# Patient Record
Sex: Female | Born: 1980 | Race: White | Hispanic: No | Marital: Single | Smoking: Current every day smoker
Health system: Southern US, Community
[De-identification: ages and names within clinical notes are randomized; demographics above are authoritative.]

## PROBLEM LIST (undated history)

## (undated) ENCOUNTER — Emergency Department (HOSPITAL_COMMUNITY): Payer: Medicaid Other

## (undated) DIAGNOSIS — F319 Bipolar disorder, unspecified: Secondary | ICD-10-CM

## (undated) DIAGNOSIS — Z765 Malingerer [conscious simulation]: Secondary | ICD-10-CM

## (undated) DIAGNOSIS — G8929 Other chronic pain: Secondary | ICD-10-CM

## (undated) DIAGNOSIS — C801 Malignant (primary) neoplasm, unspecified: Secondary | ICD-10-CM

## (undated) DIAGNOSIS — M5126 Other intervertebral disc displacement, lumbar region: Secondary | ICD-10-CM

## (undated) DIAGNOSIS — M329 Systemic lupus erythematosus, unspecified: Secondary | ICD-10-CM

## (undated) DIAGNOSIS — IMO0002 Reserved for concepts with insufficient information to code with codable children: Secondary | ICD-10-CM

## (undated) DIAGNOSIS — R109 Unspecified abdominal pain: Secondary | ICD-10-CM

## (undated) DIAGNOSIS — F419 Anxiety disorder, unspecified: Secondary | ICD-10-CM

## (undated) HISTORY — PX: TONSILLECTOMY: SUR1361

## (undated) HISTORY — PX: CHOLECYSTECTOMY: SHX55

## (undated) HISTORY — PX: ABDOMINAL HYSTERECTOMY: SHX81

---

## 2005-03-23 ENCOUNTER — Inpatient Hospital Stay (HOSPITAL_COMMUNITY): Admission: AD | Admit: 2005-03-23 | Discharge: 2005-03-23 | Payer: Self-pay | Admitting: *Deleted

## 2005-08-08 ENCOUNTER — Emergency Department (HOSPITAL_COMMUNITY): Admission: EM | Admit: 2005-08-08 | Discharge: 2005-08-08 | Payer: Self-pay | Admitting: Emergency Medicine

## 2005-08-14 ENCOUNTER — Inpatient Hospital Stay (HOSPITAL_COMMUNITY): Admission: AD | Admit: 2005-08-14 | Discharge: 2005-08-14 | Payer: Self-pay | Admitting: Obstetrics and Gynecology

## 2005-10-04 ENCOUNTER — Inpatient Hospital Stay (HOSPITAL_COMMUNITY): Admission: AD | Admit: 2005-10-04 | Discharge: 2005-10-04 | Payer: Self-pay | Admitting: Gynecology

## 2005-11-26 ENCOUNTER — Ambulatory Visit (HOSPITAL_COMMUNITY): Admission: RE | Admit: 2005-11-26 | Discharge: 2005-11-26 | Payer: Self-pay | Admitting: Obstetrics

## 2005-11-30 ENCOUNTER — Emergency Department (HOSPITAL_COMMUNITY): Admission: EM | Admit: 2005-11-30 | Discharge: 2005-11-30 | Payer: Self-pay | Admitting: Family Medicine

## 2006-01-12 ENCOUNTER — Inpatient Hospital Stay (HOSPITAL_COMMUNITY): Admission: AD | Admit: 2006-01-12 | Discharge: 2006-01-12 | Payer: Self-pay | Admitting: Obstetrics

## 2006-01-19 ENCOUNTER — Inpatient Hospital Stay (HOSPITAL_COMMUNITY): Admission: AD | Admit: 2006-01-19 | Discharge: 2006-01-20 | Payer: Self-pay | Admitting: Obstetrics

## 2006-01-20 ENCOUNTER — Inpatient Hospital Stay (HOSPITAL_COMMUNITY): Admission: AD | Admit: 2006-01-20 | Discharge: 2006-01-22 | Payer: Self-pay | Admitting: Obstetrics & Gynecology

## 2006-11-27 ENCOUNTER — Emergency Department (HOSPITAL_COMMUNITY): Admission: EM | Admit: 2006-11-27 | Discharge: 2006-11-27 | Payer: Self-pay | Admitting: Family Medicine

## 2007-01-08 ENCOUNTER — Emergency Department (HOSPITAL_COMMUNITY): Admission: EM | Admit: 2007-01-08 | Discharge: 2007-01-08 | Payer: Self-pay | Admitting: Family Medicine

## 2007-01-20 ENCOUNTER — Emergency Department (HOSPITAL_COMMUNITY): Admission: EM | Admit: 2007-01-20 | Discharge: 2007-01-20 | Payer: Self-pay | Admitting: Emergency Medicine

## 2007-02-04 ENCOUNTER — Emergency Department (HOSPITAL_COMMUNITY): Admission: EM | Admit: 2007-02-04 | Discharge: 2007-02-04 | Payer: Self-pay | Admitting: Emergency Medicine

## 2007-02-10 ENCOUNTER — Emergency Department (HOSPITAL_COMMUNITY): Admission: EM | Admit: 2007-02-10 | Discharge: 2007-02-10 | Payer: Self-pay | Admitting: Family Medicine

## 2010-10-23 NOTE — H&P (Signed)
Sonya King, Sonya King             ACCOUNT NO.:  [id number]   MEDICAL RECORD NO.:  [id number]          PATIENT TYPE:  INP   LOCATION:  9166                          FACILITY:  [facility]   PHYSICIAN:  Roseanna Rainbow, M.D.DATE OF BIRTH:  07-16-1980   DATE OF ADMISSION:  01/20/2006  DATE OF DISCHARGE:                                HISTORY & PHYSICAL   CHIEF COMPLAINT:  The patient is a 30 year old para 1 with an estimated date  of confinement of August 15 with an intrauterine pregnancy at 40 weeks  complaining of uterine contractions.   HISTORY OF PRESENT ILLNESS:  Please see the above.   ALLERGIES:  No known drug allergies.   MEDICATIONS:  Prenatal vitamins.   RISK FACTORS:  Late onset of care, current tobacco use.   PRENATAL LABS:  Blood type O positive, antibody screen negative, RPR  nonreactive, Rubella immune, hepatitis B surface antigen negative, GBS  negative, HIV nonreactive, Chlamydia negative, GC negative. Urine culture  and sensitivity no growth.  Hematocrit 36.5, hemoglobin 12.2, platelets  207,000.  Pap smear: ASCUS with negative high risk HPV.   PAST GYN HISTORY:  Cervical dysplasia status post cryocautery.   PAST MEDICAL HISTORY:  No significant history of medical diseases.   PAST SURGICAL HISTORY:  No previous surgeries.   SOCIAL HISTORY:  Single, does not give any significant history of alcohol  use, she currently smokes less than 1/2 pack per day, has smoked for 5-10  years.  Denies illicit drug use.   FAMILY HISTORY:  Hypertension.   PAST OB HISTORY:  She has a history of one spontaneous vaginal delivery.   PHYSICAL EXAMINATION:  Blood pressure 135/86, vital signs stable, afebrile.  Fetal heart rate 170s to 180s, occasional variable, mild to moderate  decelerations.  Uterine contractions every 2 minutes.  Sterile vaginal exam  fully dilated, bulging bag of water, vertex at a +1 station, questionable  direct OP status post artificial rupture of membranes  for clear fluid.   ASSESSMENT:  Term, active labor, mild fetal sinus tachycardia.   PLAN:  Admission, delivery imminent.      Roseanna Rainbow, M.D.  Electronically Signed     LAJ/MEDQ  D:  01/20/2006  T:  01/20/2006  Job:  [id number]

## 2010-10-23 NOTE — H&P (Signed)
Sonya King, Sonya King             ACCOUNT NO.:  [id number]   MEDICAL RECORD NO.:  [id number]          PATIENT TYPE:  INP   LOCATION:  9166                          [facility]ILITY:  [facility]   PHYSICIAN:  Roseanna Rainbow, M.D.DATE OF BIRTH:  [birth year]   DATE OF ADMISSION:  01/20/2006  DATE OF DISCHARGE:                                HISTORY & PHYSICAL   CHIEF COMPLAINT:  The patient is a 31 year old para 1 with an estimated date  of confinement of August 16 complaining of uterine contractions.   HISTORY OF PRESENT ILLNESS:  Please see the above.   ALLERGIES:  No known drug allergies.   MEDICATIONS:  Prenatal vitamins.   OB RISK FACTORS:  Late onset of prenatal care.  Current tobacco use.   PRENATAL LABS:  Blood type O positive.  Antibody screen negative.  RPR  nonreactive.  Rubella immune.  Hepatitis B surface antigen negative.  GBS  negative.  HIV nonreactive.  Chlamydia negative.  GC negative.  Urine  culture and sensitivity no growth.  Hematocrit 36.5, hemoglobin 12.2,  platelets 207,000.  Pap smear ascus, high risk HPV detected.   PAST GYN HISTORY:  She has a history of cervical dysplasia.  She is status  post cryocautery of the cervix.   PAST MEDICAL HISTORY:  No significant history of medical diseases.   PAST SURGICAL HISTORY:  Please see the above.   SOCIAL HISTORY:  Single.  She does not give any significant history of  alcohol use.  She currently smokes less than 1/2 pack per day, smoked for 5-  10 years.  She denies illicit drug use.   FAMILY HISTORY:  Hypertension.   Dictation ends at this point.      Roseanna Rainbow, M.D.     Sonya King  D:  01/20/2006  T:  01/20/2006  Job:  [id number]

## 2011-03-19 LAB — DIFFERENTIAL
Basophils Absolute: 0
Basophils Relative: 0
Eosinophils Absolute: 0.2
Eosinophils Relative: 3
Lymphocytes Relative: 27
Lymphs Abs: 1.5
Monocytes Absolute: 0.4
Monocytes Relative: 7
Neutro Abs: 3.4
Neutrophils Relative %: 63

## 2011-03-19 LAB — I-STAT 8, (EC8 V) (CONVERTED LAB)
Acid-base deficit: 5 — ABNORMAL HIGH
BUN: 9
Bicarbonate: 18.7 — ABNORMAL LOW
Chloride: 110
Glucose, Bld: 92
HCT: 42
Hemoglobin: 14.3
Operator id: 288831
Potassium: 4
Sodium: 137
TCO2: 20
pCO2, Ven: 31.1 — ABNORMAL LOW
pH, Ven: 7.387 — ABNORMAL HIGH

## 2011-03-19 LAB — URINALYSIS, ROUTINE W REFLEX MICROSCOPIC
Bilirubin Urine: NEGATIVE
Glucose, UA: NEGATIVE
Hgb urine dipstick: NEGATIVE
Ketones, ur: NEGATIVE
Nitrite: NEGATIVE
Protein, ur: NEGATIVE
Specific Gravity, Urine: 1.018
Urobilinogen, UA: 0.2
pH: 6.5

## 2011-03-19 LAB — URINE MICROSCOPIC-ADD ON

## 2011-03-19 LAB — GC/CHLAMYDIA PROBE AMP, GENITAL
Chlamydia, DNA Probe: NEGATIVE
GC Probe Amp, Genital: NEGATIVE

## 2011-03-19 LAB — WET PREP, GENITAL
Clue Cells Wet Prep HPF POC: NONE SEEN
Trich, Wet Prep: NONE SEEN
WBC, Wet Prep HPF POC: NONE SEEN
Yeast Wet Prep HPF POC: NONE SEEN

## 2011-03-19 LAB — RPR
RPR Ser Ql: NONREACTIVE
RPR Ser Ql: NONREACTIVE

## 2011-03-19 LAB — HEPATIC FUNCTION PANEL
ALT: 10
AST: 22
Albumin: 3.6
Alkaline Phosphatase: 61
Bilirubin, Direct: 0.1
Indirect Bilirubin: 0.5
Total Bilirubin: 0.6
Total Protein: 6.3

## 2011-03-19 LAB — POCT PREGNANCY, URINE
Operator id: 288831
Preg Test, Ur: NEGATIVE

## 2011-03-19 LAB — CBC
HCT: 38.2
Hemoglobin: 13
MCHC: 34.1
MCV: 87.3
Platelets: 263
RBC: 4.37
RDW: 15.2 — ABNORMAL HIGH
WBC: 5.4

## 2011-03-19 LAB — POCT I-STAT CREATININE
Creatinine, Ser: 0.5
Operator id: 288831

## 2011-03-19 LAB — LIPASE, BLOOD: Lipase: 15

## 2011-03-24 LAB — POCT RAPID STREP A: Streptococcus, Group A Screen (Direct): POSITIVE — AB

## 2015-10-17 ENCOUNTER — Encounter (HOSPITAL_COMMUNITY): Payer: Self-pay

## 2015-10-17 ENCOUNTER — Emergency Department (HOSPITAL_COMMUNITY)
Admission: EM | Admit: 2015-10-17 | Discharge: 2015-10-17 | Disposition: A | Discharge status: Home / Self Care | Payer: Self-pay | Attending: Emergency Medicine | Admitting: Emergency Medicine

## 2015-10-17 DIAGNOSIS — M546 Pain in thoracic spine: Secondary | ICD-10-CM | POA: Insufficient documentation

## 2015-10-17 DIAGNOSIS — Z9889 Other specified postprocedural states: Secondary | ICD-10-CM | POA: Insufficient documentation

## 2015-10-17 DIAGNOSIS — F17200 Nicotine dependence, unspecified, uncomplicated: Secondary | ICD-10-CM | POA: Insufficient documentation

## 2015-10-17 DIAGNOSIS — G8929 Other chronic pain: Secondary | ICD-10-CM | POA: Insufficient documentation

## 2015-10-17 DIAGNOSIS — F419 Anxiety disorder, unspecified: Secondary | ICD-10-CM | POA: Insufficient documentation

## 2015-10-17 DIAGNOSIS — M545 Low back pain: Secondary | ICD-10-CM | POA: Insufficient documentation

## 2015-10-17 DIAGNOSIS — M549 Dorsalgia, unspecified: Secondary | ICD-10-CM

## 2015-10-17 HISTORY — DX: Other intervertebral disc displacement, lumbar region: M51.26

## 2015-10-17 HISTORY — DX: Anxiety disorder, unspecified: F41.9

## 2015-10-17 HISTORY — DX: Bipolar disorder, unspecified: F31.9

## 2015-10-17 MED ORDER — ALPRAZOLAM 1 MG PO TABS
2.0000 mg | ORAL_TABLET | Freq: Once | ORAL | Status: AC
  Administered 2015-10-17: 2 mg via ORAL
  Filled 2015-10-17: qty 2

## 2015-10-17 MED ORDER — OXYCODONE-ACETAMINOPHEN 5-325 MG PO TABS
1.0000 | ORAL_TABLET | Freq: Once | ORAL | Status: AC
  Administered 2015-10-17: 1 via ORAL
  Filled 2015-10-17: qty 1

## 2015-10-17 NOTE — ED Notes (Signed)
Pt has slipped disc and is on pain meds.  Also takes meds for anxiety.  Pt here for emergent funeral of mother.  Missed her appt and cannot get refill of meds.  Here for another 2 weeks with family.  Pt requesting percocet 7.5, xanax 2mg 

## 2015-10-17 NOTE — ED Notes (Signed)
Patient requesting prescriptions for Percocet and Xanax.  Patient states she is from [place] here for her mother's funeral, and left medications at home.  Patient states she has "plenty" of her other medications besides Percocet and Xanax.  Patient states she spoke with her pain management doctor in [place] that suggested she come to the ER for medications in order to prevent seizures.  Patient takes medications for chronic back pain and anxiety.

## 2015-10-17 NOTE — ED Provider Notes (Signed)
CSN: [id number]     Arrival date & time 10/17/15  1730 History  By signing my name below, I, Soijett Blue, attest that this documentation has been prepared under the direction and in the presence of Hyman Bible, PA-C Electronically Signed: [place], ED Scribe. 10/17/2015. 7:17 PM.   Chief Complaint  Patient presents with  . Back Pain  . Anxiety      The history is provided by the patient. No language interpreter was used.    HPI Comments: Sonya King is a 35 y.o. female with a medical hx of "ruptured lumbar disc"who presents to the Emergency Department complaining of chronic back pain worsening one week ago. Pt reports that she has a slipped disc and is on chronic pain medications. Pt states that she is from [place] and is in town for an emergent funeral for her mother. Pt states that she missed her pain management appointment due to being in [place] for her mother funeral and was unable to get a refill of her medications. Pt states that she is on xanax and percocet daily with her last dose being 4/31/17. Pt notes that 8 years ago she was 8 months pregnant and she fell down a flight of steps and has been in physical therapy and in pain management since.  No new injury or trauma.  Pt is now requesting Rx refill of 7.5 percocet and xanax.   She states that she is having associated symptoms of diarrhea and feeling shaky. She states that she has not tried any medications for the relief for her symptoms. Pt denies gait problem, fever, chills, numbness, tingling, bowel/bladder incontinence, or any other symptoms. Pt states that she is not driving, but her sister is driving.    Past Medical History  Diagnosis Date  . Ruptured lumbar disc   . Anxiety   . Bipolar 1 disorder [facility])    Past Surgical History  Procedure Laterality Date  . Abdominal hysterectomy    . Cholecystectomy    . Tonsillectomy     History reviewed. No pertinent family history. Social History  Substance Use Topics   . Smoking status: Current Every Day Smoker  . Smokeless tobacco: None  . Alcohol Use: No   OB History    No data available     Review of Systems  A complete 10 system review of systems was obtained and all systems are negative except as noted in the HPI and PMH.   Allergies  Review of patient's allergies indicates no known allergies.  Home Medications   Prior to Admission medications   Not on File   BP 123/85 mmHg  Pulse 100  Temp(Src) 97.8 F (36.6 C) (Oral)  Resp 18  SpO2 100% Physical Exam  Constitutional: She is oriented to person, place, and time. She appears well-developed and well-nourished. No distress.  HENT:  Head: Normocephalic and atraumatic.  Eyes: EOM are normal.  Neck: Neck supple.  Cardiovascular: Normal rate, regular rhythm and normal heart sounds.  Exam reveals no gallop and no friction rub.   No murmur heard. Pulmonary/Chest: Effort normal and breath sounds normal. No respiratory distress. She has no wheezes. She has no rales.  Abdominal: She exhibits no distension.  Musculoskeletal: Normal range of motion.  Diffuse TTP of thoarcic and lumbar spine. 5/5 strength of lower extremities bilaterally.   Neurological: She is alert and oriented to person, place, and time.  2+ patellar reflexes bilaterally  Skin: Skin is warm and dry.  Psychiatric: She has  a normal mood and affect. Her behavior is normal.  Nursing note and vitals reviewed.   ED Course  Procedures (including critical care time) DIAGNOSTIC STUDIES: Oxygen Saturation is 100% on RA, nl by my interpretation.    COORDINATION OF CARE: 7:11 PM Discussed treatment plan with pt at bedside which includes percocet and xanax and pt agreed to plan.    Labs Review Labs Reviewed - No data to display  Imaging Review No results found.    EKG Interpretation None      MDM   Final diagnoses:  None    Patient with back pain.  No neurological deficits and normal neuro exam.  Patient is  ambulatory.  No loss of bowel or bladder control.  No concern for cauda equina.  No fever, night sweats, weight loss, h/o cancer, IVDA, no recent procedure to back. No urinary symptoms suggestive of UTI. Pt given a dose of xanax and percocet while in the ED.  Patient informed that she would not be given prescriptions for Percocet and Xanax for chronic pain and anxiety.  Supportive care and return precaution discussed. Appears safe for discharge at this time. Follow up as indicated in discharge paperwork.   According to the [place] Controlled Substance Reporting System, pt received twelve 7.5 mg percocet and xanax on 10/09/15 that was Rx by [facility].  However, patient reported to me that her last dose was 4/31/17.  I personally performed the services described in this documentation, which was scribed in my presence. The recorded information has been reviewed and is accurate.     Hyman Bible, PA-C 10/18/15 [place], MD 10/20/15 [phone]

## 2015-10-18 ENCOUNTER — Ambulatory Visit (HOSPITAL_COMMUNITY)
Admission: EM | Admit: 2015-10-18 | Discharge: 2015-10-18 | Disposition: A | Discharge status: Home / Self Care | Payer: PRIVATE HEALTH INSURANCE | Attending: Emergency Medicine | Admitting: Emergency Medicine

## 2015-10-18 ENCOUNTER — Encounter (HOSPITAL_COMMUNITY): Payer: Self-pay | Admitting: Emergency Medicine

## 2015-10-18 DIAGNOSIS — Z760 Encounter for issue of repeat prescription: Secondary | ICD-10-CM

## 2015-10-18 DIAGNOSIS — F419 Anxiety disorder, unspecified: Secondary | ICD-10-CM

## 2015-10-18 MED ORDER — ALPRAZOLAM 2 MG PO TABS: 2.0000 mg | ORAL_TABLET | Freq: Two times a day (BID) | ORAL | Status: DC | PRN

## 2015-10-18 NOTE — ED Provider Notes (Signed)
CSN: [id number]     Arrival date & time 10/18/15  19 History   First MD Initiated Contact with Patient 10/18/15 1826     Chief Complaint  Patient presents with  . Medication Refill   (Consider location/radiation/quality/duration/timing/severity/associated sxs/prior Treatment) HPI History obtained from patient:  Pt presents with the cc of: Medication refill for anxiety and chronic pain Duration of symptoms: Out of medication for several days now. Treatment prior to arrival: None Context: Patient states that she is in [place] for her mother's funeral. She states that she lives in [place] and was unable to get to her pain management appointments prior to leaving to get refills on her medication. States that she was seen at the [facility] emergency department last night but was directed to urgent care to get refills of her medication. Patient is requesting Xanax and Percocet at this time. Xanax for chronic anxiety Percocet for lumbar disc pain. Other symptoms include: None Pain score: 6 FAMILY HISTORY: SOCIAL HISTORY: Smoker  Past Medical History  Diagnosis Date  . Ruptured lumbar disc   . Anxiety   . Bipolar 1 disorder [facility])    Past Surgical History  Procedure Laterality Date  . Abdominal hysterectomy    . Cholecystectomy    . Tonsillectomy     No family history on file. Social History  Substance Use Topics  . Smoking status: Current Every Day Smoker  . Smokeless tobacco: None  . Alcohol Use: No   OB History    No data available     Review of Systems ROS +'ve medication refill for anxiety and chronic pain  Denies: HEADACHE, NAUSEA, ABDOMINAL PAIN, CHEST PAIN, CONGESTION, DYSURIA, SHORTNESS OF BREATH  Allergies  Review of patient's allergies indicates no known allergies.  Home Medications   Prior to Admission medications   Medication Sig Start Date End Date Taking? Authorizing Provider  ALPRAZolam Duanne Moron) 2 MG tablet Take 1 tablet (2 mg total) by mouth 2 (two)  times daily as needed for anxiety. 10/18/15   Konrad Felix, PA   Meds Ordered and Administered this Visit  Medications - No data to display  BP 79/37 mmHg  Pulse 86  Temp(Src) 98.1 F (36.7 C) (Oral)  Resp 18  SpO2 98% No data found.   Physical Exam NURSES NOTES AND VITAL SIGNS REVIEWED. CONSTITUTIONAL: Well developed, well nourished, no acute distress HEENT: normocephalic, atraumatic EYES: Conjunctiva normal NECK:normal ROM, supple, no adenopathy PULMONARY:No respiratory distress, normal effort ABDOMINAL: Soft, ND, NT BS+, No CVAT MUSCULOSKELETAL: Normal ROM of all extremities,  SKIN: warm and dry without rash PSYCHIATRIC: Mood and affect, behavior are normal  ED Course  Procedures (including critical care time)  Labs Review Labs Reviewed - No data to display  Imaging Review No results found.   Visual Acuity Review  Right Eye Distance:   Left Eye Distance:   Bilateral Distance:    Right Eye Near:   Left Eye Near:    Bilateral Near:     Discussion with patient that in [place] control substance reporting database reveals that she received Percocet and Xanax at [facility] 10/09/2015. I have advised patient that I will not provide her with a prescription for Percocet but due to the fact that sudden withdrawal of benzodiazepines can be life-threatening and will continue the Xanax prescription. I have also advised patient that she should contact her doctor in [place] as the urgent care is not the place for medication prescriptions especially for chronic pain and chronic anxiety.  Prescription for Xanax 2 mg #30 provided to the patient.  MDM   1. Encounter for medication refill     Patient is reassured that there are no issues that require transfer to higher level of care at this time or additional tests. Patient is advised to continue home symptomatic treatment. Patient is advised that if there are new or worsening symptoms to attend the emergency  department, contact primary care provider, or return to UC. Instructions of care provided discharged home in stable condition.    THIS NOTE WAS GENERATED USING A VOICE RECOGNITION SOFTWARE PROGRAM. ALL REASONABLE EFFORTS  WERE MADE TO PROOFREAD THIS DOCUMENT FOR ACCURACY.  I have verbally reviewed the discharge instructions with the patient. A printed AVS was given to the patient.  All questions were answered prior to discharge.      Konrad Felix, Marvin 10/18/15 Doran Heater

## 2015-10-18 NOTE — ED Notes (Addendum)
Here to have refills on meds; Oxycodone and Xanax ... Seen at [facility] ED last night and was told to f/u here Reports she's from [place] and will be here for the next 2 weeks BP today is 79/37 A&O x4... No acute distress.

## 2015-10-18 NOTE — ED Notes (Signed)
D/c by frank patrick, pa 

## 2015-10-18 NOTE — Discharge Instructions (Signed)
Medicine Refill at the Emergency Department  We have refilled your medicine today, but it is best for you to get refills through your primary health care provider's office. In the future, please plan ahead so you do not need to get refills from the emergency department.  If the medicine we refilled was a maintenance medicine, you may have received only enough to get you by until you are able to see your regular health care provider.     This information is not intended to replace advice given to you by your health care provider. Make sure you discuss any questions you have with your health care provider.     Document Released: 09/10/2003 Document Revised: 06/14/2014 Document Reviewed: 08/31/2013  Elsevier Interactive Patient Education 2016 [employer]

## 2015-12-06 ENCOUNTER — Ambulatory Visit (HOSPITAL_COMMUNITY)
Admission: AD | Admit: 2015-12-06 | Discharge: 2015-12-06 | Disposition: A | Discharge status: Home / Self Care | Payer: PRIVATE HEALTH INSURANCE | Attending: Psychiatry | Admitting: Psychiatry

## 2015-12-06 NOTE — BH Assessment (Signed)
Consulted with Darlyne Russian, PA-C who states that patient does not met inpatient criteria at this time. He recommends that patient receives outpatient resource for psychiatry and possible addictions treatment.   Rosalin Hawking, LCSW Therapeutic Triage Specialist [place] 12/06/2015 8:53 PM
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
## 2015-12-06 NOTE — BH Assessment (Signed)
Discussed outpatient resources with patient. Patient asked if she could go to any facilities now for her prescription and was encouraged to call. Patient denies questions or concerns at this time.  Patient was discharged. Without concerns and contracts for safety.   Rosalin Hawking, LCSW Therapeutic Triage Specialist [place] 12/06/2015 9:06 PM

## 2016-01-07 ENCOUNTER — Ambulatory Visit (HOSPITAL_COMMUNITY)
Admission: EM | Admit: 2016-01-07 | Discharge: 2016-01-07 | Disposition: A | Discharge status: Home / Self Care | Payer: PRIVATE HEALTH INSURANCE | Attending: Family Medicine | Admitting: Family Medicine

## 2016-01-07 ENCOUNTER — Encounter (HOSPITAL_COMMUNITY): Payer: Self-pay | Admitting: Family Medicine

## 2016-01-07 DIAGNOSIS — F419 Anxiety disorder, unspecified: Secondary | ICD-10-CM

## 2016-01-07 MED ORDER — ALPRAZOLAM 2 MG PO TABS
2.0000 mg | ORAL_TABLET | Freq: Three times a day (TID) | ORAL | 0 refills | Status: AC
Start: 2016-01-07 — End: 2016-01-12

## 2016-01-07 NOTE — ED Triage Notes (Signed)
Pt here for anxiety sts that she ran out of her meds 2 days ago and her anxiety is worsening.

## 2016-01-09 NOTE — ED Provider Notes (Signed)
CSN: [id number]     Arrival date & time 01/07/16  1726 History   First MD Initiated Contact with Patient 01/09/16 1900     Chief Complaint  Patient presents with  . Anxiety   (Consider location/radiation/quality/duration/timing/severity/associated sxs/prior Treatment) The history is provided by the patient.  Anxiety  This is a chronic problem. The current episode started 2 days ago (She have been out of her xanx x 2 days). The problem occurs constantly. The problem has been gradually worsening. Pertinent negatives include no chest pain, no abdominal pain, no headaches and no shortness of breath. Associated symptoms comments: Positive for nervousness, muscle tension, irritability. Exacerbated by: Recent family-related stress and out of med x 2 days. Nothing relieves the symptoms. She has tried nothing for the symptoms.    Past Medical History:  Diagnosis Date  . Anxiety   . Bipolar 1 disorder ([place])   . Ruptured lumbar disc    Past Surgical History:  Procedure Laterality Date  . ABDOMINAL HYSTERECTOMY    . CHOLECYSTECTOMY    . TONSILLECTOMY     History reviewed. No pertinent family history. Social History  Substance Use Topics  . Smoking status: Current Every Day Smoker  . Smokeless tobacco: Never Used  . Alcohol use No   OB History    No data available     Review of Systems  Constitutional: Negative for diaphoresis and fatigue.  Respiratory: Negative for shortness of breath and wheezing.   Cardiovascular: Negative for chest pain and palpitations.  Gastrointestinal: Negative for abdominal pain.  Neurological: Negative for headaches.  Psychiatric/Behavioral: Positive for agitation and decreased concentration. Negative for behavioral problems, self-injury and suicidal ideas. The patient is nervous/anxious. The patient is not hyperactive.     Allergies  Review of patient's allergies indicates no known allergies.  Home Medications   Prior to Admission medications    Medication Sig Start Date End Date Taking? Authorizing Provider  alprazolam Duanne Moron) 2 MG tablet Take 1 tablet (2 mg total) by mouth 3 (three) times daily. 01/07/16 01/12/16  Barry Dienes, NP   Meds Ordered and Administered this Visit  Medications - No data to display  BP 140/92 (BP Location: Right Arm)   Pulse 88   Temp 98 F (36.7 C) (Oral)   Resp 20   Ht 5\' 4"  (1.626 m)   Wt 172 lb (78 kg)   SpO2 97%   BMI 29.52 kg/m  No data found.   Physical Exam  Constitutional: She is oriented to person, place, and time. She appears well-developed and well-nourished.  Cardiovascular: Normal rate, regular rhythm and normal heart sounds.   Pulmonary/Chest: Effort normal and breath sounds normal.  Abdominal: Soft. Bowel sounds are normal.  Neurological: She is alert and oriented to person, place, and time.  Psychiatric:  Patient appears anxious and nervous, fidgeting with her hands. She has good eye contact, has appropriate judgement. She is oriented to the situation    Urgent Care Course   Clinical Course    Procedures (including critical care time)  Labs Review Labs Reviewed - No data to display  Imaging Review No results found.   Visual Acuity Review  Right Eye Distance:   Left Eye Distance:   Bilateral Distance:    Right Eye Near:   Left Eye Near:    Bilateral Near:         MDM   1. Anxiety    Mrs. Mollard is a 35 year old female with history of anxiety and bipolar, presents today  requesting refill for her xanax. She ran out of her xanax 2 days ago. She is experiencing anxiety, nervousness and irritability. [place] controlled substances registry showed that patient has her last xanax refilled 3 months ago with 15 days of supplies. Patient instructed to establish care with a PCP for her anxiety. 5-day course of xanax 2mg  TID PRN refilled. Informed to go to ER for panic attack, if she develops one.     Barry Dienes, NP 01/09/16 1230

## 2016-01-10 ENCOUNTER — Emergency Department (HOSPITAL_COMMUNITY): Payer: Self-pay

## 2016-01-10 ENCOUNTER — Emergency Department (HOSPITAL_COMMUNITY)
Admission: EM | Admit: 2016-01-10 | Discharge: 2016-01-10 | Disposition: A | Discharge status: Home / Self Care | Payer: Self-pay | Attending: Emergency Medicine | Admitting: Emergency Medicine

## 2016-01-10 ENCOUNTER — Encounter (HOSPITAL_COMMUNITY): Payer: Self-pay | Admitting: Emergency Medicine

## 2016-01-10 DIAGNOSIS — F17200 Nicotine dependence, unspecified, uncomplicated: Secondary | ICD-10-CM | POA: Insufficient documentation

## 2016-01-10 DIAGNOSIS — K6289 Other specified diseases of anus and rectum: Secondary | ICD-10-CM

## 2016-01-10 DIAGNOSIS — T7421XA Adult sexual abuse, confirmed, initial encounter: Secondary | ICD-10-CM | POA: Insufficient documentation

## 2016-01-10 DIAGNOSIS — Y92810 Car as the place of occurrence of the external cause: Secondary | ICD-10-CM | POA: Insufficient documentation

## 2016-01-10 DIAGNOSIS — S63501A Unspecified sprain of right wrist, initial encounter: Secondary | ICD-10-CM | POA: Insufficient documentation

## 2016-01-10 DIAGNOSIS — Y939 Activity, unspecified: Secondary | ICD-10-CM | POA: Insufficient documentation

## 2016-01-10 DIAGNOSIS — R109 Unspecified abdominal pain: Secondary | ICD-10-CM | POA: Insufficient documentation

## 2016-01-10 DIAGNOSIS — Y999 Unspecified external cause status: Secondary | ICD-10-CM | POA: Insufficient documentation

## 2016-01-10 DIAGNOSIS — R102 Pelvic and perineal pain: Secondary | ICD-10-CM

## 2016-01-10 MED ORDER — IBUPROFEN 200 MG PO TABS
600.0000 mg | ORAL_TABLET | Freq: Once | ORAL | Status: AC
  Administered 2016-01-10: 600 mg via ORAL
  Filled 2016-01-10: qty 3

## 2016-01-10 MED ORDER — ACETAMINOPHEN 325 MG PO TABS
650.0000 mg | ORAL_TABLET | Freq: Once | ORAL | Status: DC
  Filled 2016-01-10: qty 2
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
## 2016-01-10 NOTE — ED Notes (Signed)
Wrist splint applied to right wrist.
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
## 2016-01-10 NOTE — ED Notes (Signed)
Patient requested pain meds, tylenol scanned and given per order, she then refused this. Med was wasted. Patient requested to speak to charge RN privately

## 2016-01-10 NOTE — ED Notes (Signed)
Bed: [facility] Expected date:  Expected time:  Means of arrival:  Comments: 

## 2016-01-10 NOTE — ED Notes (Signed)
Sane RN at bedside.
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
## 2016-01-10 NOTE — ED Notes (Signed)
Patient refusing to speak to SANE RN. Ready for discharge

## 2016-01-10 NOTE — ED Notes (Signed)
SANE  Nurse called .

## 2016-01-10 NOTE — ED Notes (Signed)
Pt. Said that she's been taking percocet for pain and not sure if ibuprofen will help her pain, requested for percocet instead.

## 2016-04-15 ENCOUNTER — Encounter (HOSPITAL_COMMUNITY): Payer: Self-pay

## 2016-04-15 DIAGNOSIS — Z8541 Personal history of malignant neoplasm of cervix uteri: Secondary | ICD-10-CM | POA: Diagnosis not present

## 2016-04-15 DIAGNOSIS — F17200 Nicotine dependence, unspecified, uncomplicated: Secondary | ICD-10-CM | POA: Diagnosis not present

## 2016-04-15 DIAGNOSIS — R1031 Right lower quadrant pain: Secondary | ICD-10-CM | POA: Insufficient documentation

## 2016-04-15 NOTE — ED Triage Notes (Signed)
Pt states she started having dull pains in right abdomen this morning and continued to become sharp shooting pains throughout the day; Pt states nausea but no vomiting; Pt c/o of pain 10/10 on arrival. Pt a&ox 4.

## 2016-04-16 ENCOUNTER — Emergency Department (HOSPITAL_COMMUNITY): Payer: Medicaid Other

## 2016-04-16 ENCOUNTER — Emergency Department (HOSPITAL_COMMUNITY)
Admission: EM | Admit: 2016-04-16 | Discharge: 2016-04-16 | Disposition: A | Discharge status: Home / Self Care | Payer: Medicaid Other | Attending: Emergency Medicine | Admitting: Emergency Medicine

## 2016-04-16 DIAGNOSIS — R1031 Right lower quadrant pain: Secondary | ICD-10-CM

## 2016-04-16 HISTORY — DX: Malignant (primary) neoplasm, unspecified: C80.1

## 2016-04-16 LAB — COMPREHENSIVE METABOLIC PANEL
ALBUMIN: 3.5 g/dL (ref 3.5–5.0)
ALK PHOS: 54 U/L (ref 38–126)
ALT: 6 U/L — AB (ref 14–54)
AST: 17 U/L (ref 15–41)
Anion gap: 8 (ref 5–15)
BUN: 5 mg/dL — AB (ref 6–20)
CHLORIDE: 110 mmol/L (ref 101–111)
CO2: 21 mmol/L — AB (ref 22–32)
CREATININE: 0.82 mg/dL (ref 0.44–1.00)
Calcium: 8.8 mg/dL — ABNORMAL LOW (ref 8.9–10.3)
GFR calc non Af Amer: >60 mL/min (ref >60)
GLUCOSE: 103 mg/dL — AB (ref 65–99)
Potassium: 4.1 mmol/L (ref 3.5–5.1)
SODIUM: 139 mmol/L (ref 135–145)
Total Bilirubin: 0.4 mg/dL (ref 0.3–1.2)
Total Protein: 5.6 g/dL — ABNORMAL LOW (ref 6.5–8.1)

## 2016-04-16 LAB — URINALYSIS, ROUTINE W REFLEX MICROSCOPIC
BILIRUBIN URINE: NEGATIVE
GLUCOSE, UA: NEGATIVE mg/dL
Ketones, ur: NEGATIVE mg/dL
Leukocytes, UA: NEGATIVE
Nitrite: NEGATIVE
PROTEIN: NEGATIVE mg/dL
Specific Gravity, Urine: 1.012 (ref 1.005–1.030)
pH: 5.5 (ref 5.0–8.0)

## 2016-04-16 LAB — GC/CHLAMYDIA PROBE AMP (~~LOC~~) NOT AT ARMC
CHLAMYDIA, DNA PROBE: NEGATIVE
Neisseria Gonorrhea: NEGATIVE

## 2016-04-16 LAB — CBC
HCT: 43.9 % (ref 36.0–46.0)
Hemoglobin: 15.3 g/dL — ABNORMAL HIGH (ref 12.0–15.0)
MCH: 31.5 pg (ref 26.0–34.0)
MCHC: 34.9 g/dL (ref 30.0–36.0)
MCV: 90.5 fL (ref 78.0–100.0)
PLATELETS: 270 10*3/uL (ref 150–400)
RBC: 4.85 MIL/uL (ref 3.87–5.11)
RDW: 13.3 % (ref 11.5–15.5)
WBC: 17.2 10*3/uL — ABNORMAL HIGH (ref 4.0–10.5)

## 2016-04-16 LAB — WET PREP, GENITAL
SPERM: NONE SEEN
TRICH WET PREP: NONE SEEN
WBC, Wet Prep HPF POC: NONE SEEN
YEAST WET PREP: NONE SEEN

## 2016-04-16 LAB — LIPASE, BLOOD: LIPASE: 20 U/L (ref 11–51)

## 2016-04-16 LAB — URINE MICROSCOPIC-ADD ON: WBC, UA: NONE SEEN WBC/hpf (ref 0–5)

## 2016-04-16 MED ORDER — MORPHINE SULFATE (PF) 4 MG/ML IV SOLN
4.0000 mg | Freq: Once | INTRAVENOUS | Status: AC
  Administered 2016-04-16: 4 mg via INTRAVENOUS
  Filled 2016-04-16: qty 1

## 2016-04-16 MED ORDER — KETOROLAC TROMETHAMINE 30 MG/ML IJ SOLN
30.0000 mg | Freq: Once | INTRAMUSCULAR | Status: AC
  Administered 2016-04-16: 30 mg via INTRAVENOUS
  Filled 2016-04-16: qty 1

## 2016-04-16 MED ORDER — IBUPROFEN 600 MG PO TABS: 600.0000 mg | ORAL_TABLET | Freq: Four times a day (QID) | ORAL | 0 refills | Status: DC | PRN

## 2016-04-16 MED ORDER — IOPAMIDOL (ISOVUE-300) INJECTION 61%
INTRAVENOUS | Status: AC
Start: 2016-04-16 — End: 2016-04-16
  Administered 2016-04-16: 100 mL
  Filled 2016-04-16: qty 100

## 2016-04-16 MED ORDER — ONDANSETRON HCL 4 MG/2ML IJ SOLN
4.0000 mg | Freq: Once | INTRAMUSCULAR | Status: AC
  Administered 2016-04-16: 4 mg via INTRAVENOUS
  Filled 2016-04-16: qty 2

## 2016-04-16 MED ORDER — ONDANSETRON 4 MG PO TBDP: 4.0000 mg | ORAL_TABLET | Freq: Three times a day (TID) | ORAL | 0 refills | Status: DC | PRN

## 2016-04-16 MED ORDER — SODIUM CHLORIDE 0.9 % IV BOLUS (SEPSIS)
1000.0000 mL | Freq: Once | INTRAVENOUS | Status: AC
  Administered 2016-04-16: 1000 mL via INTRAVENOUS

## 2016-04-16 MED ORDER — TRAMADOL HCL 50 MG PO TABS: 50.0000 mg | ORAL_TABLET | Freq: Four times a day (QID) | ORAL | 0 refills | Status: DC | PRN

## 2016-04-16 NOTE — Discharge Instructions (Signed)
Ibuprofen as needed for mild to moderate pain. Ultram for severe pain - This can make you very drowsy - please do not drink alcohol, operate heavy machinery or drive on this medication. Zofran for nausea.   Please seek immediate care if you develop any of the following symptoms: The pain does not go away.  You have a fever.  You throw up (vomit).  You pass bloody or black tarry stools.  There is bright red blood in the stool.  Constipation stays for more than 4 days. There is rectal pain.  You do not seem to be getting better.  You have any questions or concerns.

## 2016-04-16 NOTE — ED Notes (Signed)
Patient returned from Ultrasound. 

## 2016-04-16 NOTE — ED Provider Notes (Signed)
[facility] DEPT Provider Note   CSN: [id number] Arrival date & time: 04/15/16  2330     History   Chief Complaint Chief Complaint  Patient presents with  . Abdominal Pain    HPI Sonya King is a 35 y.o. female.  The history is provided by the patient and medical records. No language interpreter was used.    Sonya King is a 35 y.o. female  with a PMH of bipolar disorder, anxiety, cervical cancer s/p hysterectomy and prior cholecystectomy who presents to the Emergency Department complaining of worsening sharp stabbing right lower abdominal pain that began this morning. Patient reports associated nausea but no emesis. She took a Percocet at home with mild relief of pain. Denies any history of similar signs or symptoms. No fevers, vaginal discharge, new back pain, diarrhea, constipation, dysuria, urgency/frequency.    Past Medical History:  Diagnosis Date  . Anxiety   . Bipolar 1 disorder ([place])   . Cancer ([place])    cervical  . Ruptured lumbar disc     There are no active problems to display for this patient.   Past Surgical History:  Procedure Laterality Date  . ABDOMINAL HYSTERECTOMY    . CHOLECYSTECTOMY    . TONSILLECTOMY      OB History    No data available       Home Medications    Prior to Admission medications   Medication Sig Start Date End Date Taking? Authorizing Provider  doxylamine, Sleep, (UNISOM) 25 MG tablet Take 75 mg by mouth at bedtime.   Yes Historical Provider, MD  ibuprofen (ADVIL,MOTRIN) 600 MG tablet Take 1 tablet (600 mg total) by mouth every 6 (six) hours as needed for mild pain or moderate pain. 04/16/16   Tavarus [place] Pilcher Linden Mikes, PA-C  ondansetron (ZOFRAN ODT) 4 MG disintegrating tablet Take 1 tablet (4 mg total) by mouth every 8 (eight) hours as needed for nausea or vomiting. 04/16/16   Ozella Almond Jerrod Damiano, PA-C  traMADol (ULTRAM) 50 MG tablet Take 1 tablet (50 mg total) by mouth every 6 (six) hours as needed for severe pain.  04/16/16   [place], PA-C    Family History No family history on file.  Social History Social History  Substance Use Topics  . Smoking status: Current Every Day Smoker  . Smokeless tobacco: Never Used  . Alcohol use No     Allergies   Patient has no known allergies.   Review of Systems Review of Systems  Constitutional: Negative for chills and fever.  HENT: Negative for congestion.   Eyes: Negative for visual disturbance.  Respiratory: Negative for cough and shortness of breath.   Gastrointestinal: Positive for abdominal pain and nausea. Negative for blood in stool, constipation, diarrhea and vomiting.  Genitourinary: Negative for dysuria, frequency, urgency and vaginal discharge.  Musculoskeletal: Negative for back pain.  Skin: Negative for rash.  Neurological: Negative for headaches.     Physical Exam Updated Vital Signs BP (!) 91/48   Pulse 74   Temp 97.6 F (36.4 C) (Oral)   Resp 16   Ht 5\' 4"  (1.626 m)   Wt 82.6 kg   SpO2 98%   BMI 31.24 kg/m   Physical Exam  Constitutional: She is oriented to person, place, and time. She appears well-developed and well-nourished. No distress.  HENT:  Head: Normocephalic and atraumatic.  Cardiovascular: Normal rate, regular rhythm and normal heart sounds.   No murmur heard. Pulmonary/Chest: Effort normal and breath sounds normal. No  respiratory distress.  Abdominal: Soft. She exhibits no distension. There is no rebound.  Tenderness to palpation of the right lower quadrant and suprapubic area, most significantly at McBurney's point, with involuntary guarding.   Genitourinary:  Genitourinary Comments: Chaperone present for exam. Cervix surgically absent. Tenderness to right adnexal region but no masses or fullness appreciated. Scant amount of white discharge.   Musculoskeletal: She exhibits no edema.  Neurological: She is alert and oriented to person, place, and time.  Skin: Skin is warm and dry.  Nursing note  and vitals reviewed.    ED Treatments / Results  Labs (all labs ordered are listed, but only abnormal results are displayed) Labs Reviewed  WET PREP, GENITAL - Abnormal; Notable for the following:       Result Value   Clue Cells Wet Prep HPF POC PRESENT (*)    All other components within normal limits  COMPREHENSIVE METABOLIC PANEL - Abnormal; Notable for the following:    CO2 21 (*)    Glucose, Bld 103 (*)    BUN 5 (*)    Calcium 8.8 (*)    Total Protein 5.6 (*)    ALT 6 (*)    All other components within normal limits  CBC - Abnormal; Notable for the following:    WBC 17.2 (*)    Hemoglobin 15.3 (*)    All other components within normal limits  URINALYSIS, ROUTINE W REFLEX MICROSCOPIC (NOT AT [facility]) - Abnormal; Notable for the following:    Hgb urine dipstick MODERATE (*)    All other components within normal limits  URINE MICROSCOPIC-ADD ON - Abnormal; Notable for the following:    Squamous Epithelial / LPF 0-5 (*)    Bacteria, UA RARE (*)    All other components within normal limits  LIPASE, BLOOD  GC/CHLAMYDIA PROBE AMP ([place]) NOT AT [facility]    EKG  EKG Interpretation None       Radiology US Transvaginal Non-ob  Result Date: 04/16/2016 CLINICAL DATA:  Low abdominal pain. History of cervical cancer with hysterectomy in 2014. EXAM: TRANSABDOMINAL AND TRANSVAGINAL ULTRASOUND OF PELVIS DOPPLER ULTRASOUND OF OVARIES TECHNIQUE: Both transabdominal and transvaginal ultrasound examinations of the pelvis were performed. Transabdominal technique was performed for global imaging of the pelvis including uterus, ovaries, adnexal regions, and pelvic cul-de-sac. It was necessary to proceed with endovaginal exam following the transabdominal exam to visualize the ovaries. Color and duplex Doppler ultrasound was utilized to evaluate blood flow to the ovaries. COMPARISON:  Abdominal CT from earlier today FINDINGS: Uterus Surgically absent Right ovary Measurements: 43 x 16 x 22 mm.  Internal cystic structure has a thick echogenic wall and prominent peripheral blood flow, findings of corpus luteum. Negative for mass. Left ovary Measurements: 27 x 18 x 17 mm. Normal appearance/no adnexal mass. Pulsed Doppler evaluation of both ovaries demonstrates normal venous waveforms bilaterally. Arterial waveforms are demonstrated on the right much better than left. Regardless of doppler findings, no morphologic changes of torsion. Other findings No abnormal free fluid. IMPRESSION: 1. No explanation for pain.  Negative ovaries. 2. Hysterectomy. Electronically Signed   By: Monte Fantasia M.D.   On: 04/16/2016 07:18   US Pelvis Complete  Result Date: 04/16/2016 CLINICAL DATA:  Low abdominal pain. History of cervical cancer with hysterectomy in 2014. EXAM: TRANSABDOMINAL AND TRANSVAGINAL ULTRASOUND OF PELVIS DOPPLER ULTRASOUND OF OVARIES TECHNIQUE: Both transabdominal and transvaginal ultrasound examinations of the pelvis were performed. Transabdominal technique was performed for global imaging of the pelvis including uterus,  ovaries, adnexal regions, and pelvic cul-de-sac. It was necessary to proceed with endovaginal exam following the transabdominal exam to visualize the ovaries. Color and duplex Doppler ultrasound was utilized to evaluate blood flow to the ovaries. COMPARISON:  Abdominal CT from earlier today FINDINGS: Uterus Surgically absent Right ovary Measurements: 43 x 16 x 22 mm. Internal cystic structure has a thick echogenic wall and prominent peripheral blood flow, findings of corpus luteum. Negative for mass. Left ovary Measurements: 27 x 18 x 17 mm. Normal appearance/no adnexal mass. Pulsed Doppler evaluation of both ovaries demonstrates normal venous waveforms bilaterally. Arterial waveforms are demonstrated on the right much better than left. Regardless of doppler findings, no morphologic changes of torsion. Other findings No abnormal free fluid. IMPRESSION: 1. No explanation for pain.   Negative ovaries. 2. Hysterectomy. Electronically Signed   By: Monte Fantasia M.D.   On: 04/16/2016 07:18   Ct Abdomen Pelvis W Contrast  Result Date: 04/16/2016 CLINICAL DATA:  Right lower quadrant abdominal pain. History of cervical cancer, cholecystectomy, hysterectomy EXAM: CT ABDOMEN AND PELVIS WITH CONTRAST TECHNIQUE: Multidetector CT imaging of the abdomen and pelvis was performed using the standard protocol following bolus administration of intravenous contrast. CONTRAST:  125mL ISOVUE-300 IOPAMIDOL (ISOVUE-300) INJECTION 61% COMPARISON:  01/20/2007 FINDINGS: Lower chest: Mild dependent changes in the lung bases. Hepatobiliary: No focal liver abnormality is seen. Status post cholecystectomy. No biliary dilatation. Pancreas: Unremarkable. No pancreatic ductal dilatation or surrounding inflammatory changes. Spleen: Normal in size without focal abnormality. Adrenals/Urinary Tract: Adrenal glands are unremarkable. Kidneys are normal, without renal calculi, focal lesion, or hydronephrosis. Bladder is unremarkable. Stomach/Bowel: Stomach is within normal limits. Appendix appears normal. No evidence of bowel wall thickening, distention, or inflammatory changes. Vascular/Lymphatic: No significant vascular findings are present. No enlarged abdominal or pelvic lymph nodes. Reproductive: Status post hysterectomy. No adnexal masses. Other: No abdominal wall hernia or abnormality. No abdominopelvic ascites. Musculoskeletal: No acute or significant osseous findings. IMPRESSION: No acute process demonstrated in the abdomen or pelvis. No bowel obstruction or inflammation. Appendix is normal. Electronically Signed   By: Lucienne Capers M.D.   On: 04/16/2016 05:03   [place] Art/ven Flow Abd Pelv Doppler  Result Date: 04/16/2016 CLINICAL DATA:  Low abdominal pain. History of cervical cancer with hysterectomy in 2014. EXAM: TRANSABDOMINAL AND TRANSVAGINAL ULTRASOUND OF PELVIS DOPPLER ULTRASOUND OF OVARIES TECHNIQUE: Both  transabdominal and transvaginal ultrasound examinations of the pelvis were performed. Transabdominal technique was performed for global imaging of the pelvis including uterus, ovaries, adnexal regions, and pelvic cul-de-sac. It was necessary to proceed with endovaginal exam following the transabdominal exam to visualize the ovaries. Color and duplex Doppler ultrasound was utilized to evaluate blood flow to the ovaries. COMPARISON:  Abdominal CT from earlier today FINDINGS: Uterus Surgically absent Right ovary Measurements: 43 x 16 x 22 mm. Internal cystic structure has a thick echogenic wall and prominent peripheral blood flow, findings of corpus luteum. Negative for mass. Left ovary Measurements: 27 x 18 x 17 mm. Normal appearance/no adnexal mass. Pulsed Doppler evaluation of both ovaries demonstrates normal venous waveforms bilaterally. Arterial waveforms are demonstrated on the right much better than left. Regardless of doppler findings, no morphologic changes of torsion. Other findings No abnormal free fluid. IMPRESSION: 1. No explanation for pain.  Negative ovaries. 2. Hysterectomy. Electronically Signed   By: Monte Fantasia M.D.   On: 04/16/2016 07:18    Procedures Procedures (including critical care time)  Medications Ordered in ED Medications  morphine 4 MG/ML injection 4 mg (4  mg Intravenous Given 04/16/16 0319)  ondansetron (ZOFRAN) injection 4 mg (4 mg Intravenous Given 04/16/16 0319)  iopamidol (ISOVUE-300) 61 % injection (100 mLs  Contrast Given 04/16/16 0427)  sodium chloride 0.9 % bolus 1,000 mL (0 mLs Intravenous Stopped 04/16/16 0707)  ketorolac (TORADOL) 30 MG/ML injection 30 mg (30 mg Intravenous Given 04/16/16 0740)     Initial Impression / Assessment and Plan / ED Course  I have reviewed the triage vital signs and the nursing notes.  Pertinent labs & imaging results that were available during my care of the patient were reviewed by me and considered in my medical decision  making (see chart for details).  Clinical Course    Sonya King is a 35 y.o. female who presents to ED for right lower abdominal pain that began suddenly a few hours prior to arrival. On exam, patient is afebrile with tenderness to palpation of RLQ. She does have tenderness at McBurney's with involuntary guarding. Labs reviewed and significant for elevated white count of 17.2. UA with no signs of infection. CT abdomen was obtained which was negative. Patient reevaluated following pain and nausea meds. Feels better, but pain still present. Repeat abdominal exam improved, still significant tenderness right lower quadrant. Pelvic exam performed and patient also demonstrated some mild tenderness to the right adnexal region. Ultrasound obtained which shows no explanation for pain. Able to tolerate PO with no episodes of emesis in ED. Evaluation does not show pathology that would require ongoing emergent intervention or inpatient treatment. Significant amount of time was taken to discuss reasons to return to ED.  All questions answered.   Patient discussed with Dr. Lita Mains who agrees with treatment plan.   Final Clinical Impressions(s) / ED Diagnoses   Final diagnoses:  RLQ abdominal pain    New Prescriptions New Prescriptions   IBUPROFEN (ADVIL,MOTRIN) 600 MG TABLET    Take 1 tablet (600 mg total) by mouth every 6 (six) hours as needed for mild pain or moderate pain.   ONDANSETRON (ZOFRAN ODT) 4 MG DISINTEGRATING TABLET    Take 1 tablet (4 mg total) by mouth every 8 (eight) hours as needed for nausea or vomiting.   TRAMADOL (ULTRAM) 50 MG TABLET    Take 1 tablet (50 mg total) by mouth every 6 (six) hours as needed for severe pain.     [facility] Brissia Delisa, PA-C 04/16/16 [id number]    Julianne Rice, MD 04/17/16 [phone]

## 2016-04-17 ENCOUNTER — Encounter (HOSPITAL_COMMUNITY): Payer: Self-pay | Admitting: Nurse Practitioner

## 2016-04-17 ENCOUNTER — Emergency Department (HOSPITAL_COMMUNITY)
Admission: EM | Admit: 2016-04-17 | Discharge: 2016-04-18 | Disposition: A | Discharge status: Home / Self Care | Payer: Medicaid Other | Attending: Emergency Medicine | Admitting: Emergency Medicine

## 2016-04-17 DIAGNOSIS — C538 Malignant neoplasm of overlapping sites of cervix uteri: Secondary | ICD-10-CM | POA: Insufficient documentation

## 2016-04-17 DIAGNOSIS — Z79899 Other long term (current) drug therapy: Secondary | ICD-10-CM | POA: Insufficient documentation

## 2016-04-17 DIAGNOSIS — F17200 Nicotine dependence, unspecified, uncomplicated: Secondary | ICD-10-CM | POA: Diagnosis not present

## 2016-04-17 DIAGNOSIS — R1031 Right lower quadrant pain: Secondary | ICD-10-CM | POA: Diagnosis not present

## 2016-04-17 DIAGNOSIS — F419 Anxiety disorder, unspecified: Secondary | ICD-10-CM | POA: Diagnosis not present

## 2016-04-17 DIAGNOSIS — E876 Hypokalemia: Secondary | ICD-10-CM | POA: Diagnosis not present

## 2016-04-17 DIAGNOSIS — G8929 Other chronic pain: Secondary | ICD-10-CM | POA: Diagnosis not present

## 2016-04-17 DIAGNOSIS — R197 Diarrhea, unspecified: Secondary | ICD-10-CM

## 2016-04-17 DIAGNOSIS — M549 Dorsalgia, unspecified: Secondary | ICD-10-CM | POA: Insufficient documentation

## 2016-04-17 DIAGNOSIS — R110 Nausea: Secondary | ICD-10-CM

## 2016-04-17 MED ORDER — SODIUM CHLORIDE 0.9 % IV BOLUS (SEPSIS)
1000.0000 mL | Freq: Once | INTRAVENOUS | Status: AC
  Administered 2016-04-18: 1000 mL via INTRAVENOUS

## 2016-04-17 MED ORDER — ONDANSETRON HCL 4 MG/2ML IJ SOLN
4.0000 mg | Freq: Once | INTRAMUSCULAR | Status: AC
  Administered 2016-04-18: 4 mg via INTRAVENOUS
  Filled 2016-04-17: qty 2

## 2016-04-17 MED ORDER — MORPHINE SULFATE (PF) 4 MG/ML IV SOLN
4.0000 mg | Freq: Once | INTRAVENOUS | Status: AC
  Administered 2016-04-18: 4 mg via INTRAVENOUS
  Filled 2016-04-17: qty 1

## 2016-04-17 NOTE — ED Provider Notes (Signed)
[place]  DEPT Provider Note   CSN: [id number] Arrival date & time: 04/17/16  2317  By signing my name below, I, Jeanell Sparrow, attest that this documentation has been prepared under the direction and in the presence of non-physician practitioner, Delsa Grana, PA-C. Electronically Signed: Jeanell Sparrow, Scribe. 04/17/2016. 11:45 PM.  History   Chief Complaint Chief Complaint  Patient presents with  . Abdominal Pain   The history is provided by the patient. No language interpreter was used.   HPI Comments: Sonya King is a 35 y.o. female who presents to the Emergency Department complaining of constant moderate RLQ abdominal pain that started yesterday ago. She states she was seen here in the ED for the same complaint and was told to return if symptoms persist. She reports her pain progression was unchanged since then and unrelieved by pain medication prescribed. She describes the pain as a waxing/waning, stabbing/sharp sensation radiating to her periumbilical area. She currently rates the pain as 8/10 and states it is worsened by movement. She admits to a hx of full hysterectomy and cholecystectomy. She reports associated symptoms of nausea, diarrhea, sweats, decreased appetite.  She reports red stool and denies any bright red blood in the toilet paper.  She denies any rectal pain.  She reports diarrhea for 2 days, following a week of constipation. She has a history of intermittent constipation and diarrhea which seems to coincide with narcotic use.  She states she has chronic back pain and has been on 10 mg of oxycodone 3 times a day for several years, she also reports being on 2 mg of Xanax 3 times a day.  She reports recently moving to the area from [place], stating that she had to move quickly because her father takes care of her and he had to relocate.  She requests medication refills because she is unable to be approved for insurance here and she doesn't have a PCP.  She states the  pain is so severe that she cannot get out of bed and handle taking care of her four children.  She was given tramadol but states it, and IV morphine, "are like water" and "do nothing for her."    Per chart review patient was evaluated yesterday at [facility] ER for the same complaint of right lower quadrant abdominal pain. Her workup was significant for leukocytosis of 17.2, pelvic ultrasound and CT scan of the abdomen and pelvis were negative.  Past Medical History:  Diagnosis Date  . Anxiety   . Bipolar 1 disorder ([place])   . Cancer ([place])    cervical  . Ruptured lumbar disc     There are no active problems to display for this patient.   Past Surgical History:  Procedure Laterality Date  . ABDOMINAL HYSTERECTOMY    . CHOLECYSTECTOMY    . TONSILLECTOMY      OB History    No data available       Home Medications    Prior to Admission medications   Medication Sig Start Date End Date Taking? Authorizing Provider  doxylamine, Sleep, (UNISOM) 25 MG tablet Take 75 mg by mouth at bedtime.   Yes Historical Provider, MD  ibuprofen (ADVIL,MOTRIN) 600 MG tablet Take 1 tablet (600 mg total) by mouth every 6 (six) hours as needed for mild pain or moderate pain. 04/16/16  Yes Jaime Pilcher Ward, PA-C  ondansetron (ZOFRAN ODT) 4 MG disintegrating tablet Take 1 tablet (4 mg total) by mouth every 8 (eight) hours as needed for  nausea or vomiting. 04/16/16  Yes Jaime Pilcher Ward, PA-C  oxyCODONE-acetaminophen (PERCOCET) 10-325 MG tablet Take 1 tablet by mouth every 6 (six) hours as needed for pain.   Yes Historical Provider, MD  traMADol (ULTRAM) 50 MG tablet Take 1 tablet (50 mg total) by mouth every 6 (six) hours as needed for severe pain. 04/16/16  Yes Jaime Pilcher Ward, PA-C  alprazolam Duanne Moron) 2 MG tablet Take 0.5 tablets (1 mg total) by mouth 2 (two) times daily as needed for anxiety. 04/18/16   Delsa Grana, PA-C  dicyclomine (BENTYL) 20 MG tablet Take 1 tablet (20 mg total) by mouth 2  (two) times daily. 04/18/16   Delsa Grana, PA-C  diphenoxylate-atropine (LOMOTIL) 2.5-0.025 MG tablet Take 2 tablets by mouth 4 (four) times daily as needed for diarrhea or loose stools. 04/18/16   Delsa Grana, PA-C  ondansetron (ZOFRAN ODT) 4 MG disintegrating tablet Take 1 tablet (4 mg total) by mouth every 8 (eight) hours as needed for nausea or vomiting. 04/18/16   Delsa Grana, PA-C  potassium chloride SA (K-DUR,KLOR-CON) 20 MEQ tablet Take 1 tablet (20 mEq total) by mouth 2 (two) times daily. 04/18/16 04/21/16  Delsa Grana, PA-C  promethazine (PHENERGAN) 25 MG tablet Take 1 tablet (25 mg total) by mouth every 6 (six) hours as needed for nausea or vomiting. 04/18/16   Delsa Grana, PA-C    Family History History reviewed. No pertinent family history.  Social History Social History  Substance Use Topics  . Smoking status: Current Every Day Smoker  . Smokeless tobacco: Never Used  . Alcohol use No     Allergies   Patient has no known allergies.   Review of Systems Review of Systems 10 Systems reviewed and all are negative for acute change except as noted in the HPI.  Physical Exam Updated Vital Signs BP 132/81 (BP Location: Left Arm)   Pulse 81   Temp 98 F (36.7 C) (Oral)   Resp 18   SpO2 100%   Physical Exam  Constitutional: She is oriented to person, place, and time. She appears well-developed and well-nourished. No distress.  HENT:  Head: Normocephalic and atraumatic.  Nose: Nose normal.  Mouth/Throat: Oropharynx is clear and moist. No oropharyngeal exudate.  Eyes: Conjunctivae and EOM are normal. Pupils are equal, round, and reactive to light. Right eye exhibits no discharge. Left eye exhibits no discharge. No scleral icterus.  Neck: Normal range of motion. Neck supple. No JVD present. No tracheal deviation present. No thyromegaly present.  Cardiovascular: Normal rate, regular rhythm, normal heart sounds and intact distal pulses.  Exam reveals no gallop and no  friction rub.   No murmur heard. Pulmonary/Chest: Effort normal and breath sounds normal. No respiratory distress. She has no wheezes. She has no rales. She exhibits no tenderness.  Abdominal: Soft. Bowel sounds are normal. She exhibits no distension and no mass. There is tenderness. There is no rebound and no guarding.  Generalized abdominal tenderness w/o rebound or guarding, no CVA tenderness  Musculoskeletal: Normal range of motion. She exhibits no edema or tenderness.  Lymphadenopathy:    She has no cervical adenopathy.  Neurological: She is alert and oriented to person, place, and time. She has normal reflexes. No cranial nerve deficit. She exhibits normal muscle tone. Coordination normal.  Skin: Skin is warm and dry. No rash noted. She is not diaphoretic. No erythema. No pallor.  Psychiatric: She has a normal mood and affect. Her behavior is normal. Judgment and thought content normal.  Nursing note and vitals reviewed.    ED Treatments / Results  DIAGNOSTIC STUDIES: Oxygen Saturation is 100% on RA, normal by my interpretation.    COORDINATION OF CARE: 11:50 PM- Pt advised of plan for treatment and pt agrees.  Labs (all labs ordered are listed, but only abnormal results are displayed) Labs Reviewed  BASIC METABOLIC PANEL - Abnormal; Notable for the following:       Result Value   Potassium 3.1 (*)    BUN 5 (*)    Calcium 8.7 (*)    All other components within normal limits  URINALYSIS, ROUTINE W REFLEX MICROSCOPIC (NOT AT [facility]) - Abnormal; Notable for the following:    APPearance CLOUDY (*)    Hgb urine dipstick SMALL (*)    All other components within normal limits  RAPID URINE DRUG SCREEN, HOSP PERFORMED - Abnormal; Notable for the following:    Opiates POSITIVE (*)    Benzodiazepines POSITIVE (*)    Tetrahydrocannabinol POSITIVE (*)    All other components within normal limits  URINE MICROSCOPIC-ADD ON - Abnormal; Notable for the following:    Squamous Epithelial /  LPF 0-5 (*)    Bacteria, UA FEW (*)    All other components within normal limits  CBC WITH DIFFERENTIAL/PLATELET    EKG  EKG Interpretation None       Radiology No results found.  Procedures Procedures (including critical care time)  Medications Ordered in ED Medications  sodium chloride 0.9 % bolus 1,000 mL (0 mLs Intravenous Stopped 04/18/16 0059)  morphine 4 MG/ML injection 4 mg (4 mg Intravenous Given 04/18/16 0012)  ondansetron (ZOFRAN) injection 4 mg (4 mg Intravenous Given 04/18/16 0012)  dicyclomine (BENTYL) capsule 10 mg (10 mg Oral Given 04/18/16 0149)  LORazepam (ATIVAN) injection 1 mg (1 mg Intravenous Given 04/18/16 0150)  oxyCODONE-acetaminophen (PERCOCET/ROXICET) 5-325 MG per tablet 2 tablet (2 tablets Oral Given 04/18/16 0228)  potassium chloride SA (K-DUR,KLOR-CON) CR tablet 20 mEq (20 mEq Oral Given 04/18/16 0228)     Initial Impression / Assessment and Plan / ED Course  I have reviewed the triage vital signs and the nursing notes.  Pertinent labs & imaging results that were available during my care of the patient were reviewed by me and considered in my medical decision making (see chart for details).  Clinical Course   Patient is a 35 year old female status post hysterectomy, returns to the ER after being evaluated yesterday for right lower quadrant abdominal pain, she had negative pelvic ultrasound and negative CT abdomen pelvis and was discharged home with pain medication and antiemetics.  She returned today complaining of severe pain and improved with medications.  She states tramadol and IV morphine are "like water, and nothing for her."  She later reported running out of narcotic pain medicine and running out of benzos, both which she states she is on for chronic management and psychiatric.  Per chart review she has been visiting the ER if [place] since May, she was looked up in [place] controlled substance database and there is only 1  instance in last 6 months of having a few days of Xanax prescribed for her.  Through care everywhere there is some documentation of visit for medication refills, last notation documents her refusing to come in for urine drug screen in order to get her refill.  I consulted radiology to review scans from yesterday and advised [place] CT scan with oral contrast would provide any benefit.  Today on exam the  suspicion of acute appendicitis, she has generalized pain and diarrhea, no fever.  Radiology did not think there would be any benefit because of good visualization of all pertinent anatomy on yesterdays scans.  This was discussed with the patient and then she admitted to possible opioid withdrawal.  It does not appear that she even filled the tramadol prescription that is not the drug database.    Patient's lab work was improved from yesterday including normal white blood cell count.  She had negative Hemoccult.  Did not suspect infectious diarrhea, or acute abdominal process. Urine drug screen is positive for opiates, benzos and THC.  She had mild hypokalemia which may be secondary to diarrhea. She was given potassium replacement, Bentyl, antiemetics, Ativan and one time dose of Percocet while in the ER.  I prescribed 3 days supply of a lower dose Xanax, and provided pain management and behavioral health resources.  Nontender I'm unable to prescribe chronic pain medications without any documentation at their use, and with proper documentation can only provide short amounts of that she could last until refill.  We discussed withdraw from it. Benzos, prescribed her Lomotil, Bentyl and antiemetics.  Patient did not report any improvement in her pain, stated he continued to be 8 out of 10, but she was ambulatory in the ER, she did steal all the belongings and supplies from all of the drawers in her exam room before leaving.  She was discharged in good condition with stable vital signs.  Final Clinical Impressions(s) /  ED Diagnoses   Final diagnoses:  Diarrhea, unspecified type  Right lower quadrant abdominal pain  Nausea  Chronic back pain, unspecified back location, unspecified back pain laterality  Anxiety  Hypokalemia    New Prescriptions Discharge Medication List as of 04/18/2016  3:17 AM    START taking these medications   Details  dicyclomine (BENTYL) 20 MG tablet Take 1 tablet (20 mg total) by mouth 2 (two) times daily., Starting Sun 04/18/2016, Print    diphenoxylate-atropine (LOMOTIL) 2.5-0.025 MG tablet Take 2 tablets by mouth 4 (four) times daily as needed for diarrhea or loose stools., Starting Sun 04/18/2016, Print    !! ondansetron (ZOFRAN ODT) 4 MG disintegrating tablet Take 1 tablet (4 mg total) by mouth every 8 (eight) hours as needed for nausea or vomiting., Starting Sun 04/18/2016, Print    potassium chloride SA (K-DUR,KLOR-CON) 20 MEQ tablet Take 1 tablet (20 mEq total) by mouth 2 (two) times daily., Starting Sun 04/18/2016, Until Wed 04/21/2016, Print    promethazine (PHENERGAN) 25 MG tablet Take 1 tablet (25 mg total) by mouth every 6 (six) hours as needed for nausea or vomiting., Starting Sun 04/18/2016, Print     !! - Potential duplicate medications found. Please discuss with provider.     I personally performed the services described in this documentation, which was scribed in my presence. The recorded information has been reviewed and is accurate.       Delsa Grana, PA-C 04/18/16 [id number]    Leo Grosser, MD 04/18/16 [phone]

## 2016-04-17 NOTE — ED Triage Notes (Signed)
Pt c/o RLQ sharp stabbing pain that she was evaluated for yesterday and "advised to come back should the symptoms persist." Reports N/V/D.

## 2016-04-18 LAB — URINALYSIS, ROUTINE W REFLEX MICROSCOPIC
Bilirubin Urine: NEGATIVE
GLUCOSE, UA: NEGATIVE mg/dL
Ketones, ur: NEGATIVE mg/dL
LEUKOCYTES UA: NEGATIVE
Nitrite: NEGATIVE
PH: 6 (ref 5.0–8.0)
Protein, ur: NEGATIVE mg/dL
Specific Gravity, Urine: 1.01 (ref 1.005–1.030)

## 2016-04-18 LAB — CBC WITH DIFFERENTIAL/PLATELET
BASOS ABS: 0 10*3/uL (ref 0.0–0.1)
Basophils Relative: 0 %
EOS PCT: 2 %
Eosinophils Absolute: 0.2 10*3/uL (ref 0.0–0.7)
HEMATOCRIT: 40 % (ref 36.0–46.0)
Hemoglobin: 13.9 g/dL (ref 12.0–15.0)
LYMPHS ABS: 3.1 10*3/uL (ref 0.7–4.0)
LYMPHS PCT: 34 %
MCH: 31.2 pg (ref 26.0–34.0)
MCHC: 34.8 g/dL (ref 30.0–36.0)
MCV: 89.7 fL (ref 78.0–100.0)
MONO ABS: 0.5 10*3/uL (ref 0.1–1.0)
Monocytes Relative: 6 %
NEUTROS ABS: 5.3 10*3/uL (ref 1.7–7.7)
Neutrophils Relative %: 58 %
PLATELETS: 239 10*3/uL (ref 150–400)
RBC: 4.46 MIL/uL (ref 3.87–5.11)
RDW: 13.3 % (ref 11.5–15.5)
WBC: 9.2 10*3/uL (ref 4.0–10.5)

## 2016-04-18 LAB — BASIC METABOLIC PANEL
ANION GAP: 7 (ref 5–15)
BUN: 5 mg/dL — AB (ref 6–20)
CO2: 23 mmol/L (ref 22–32)
Calcium: 8.7 mg/dL — ABNORMAL LOW (ref 8.9–10.3)
Chloride: 109 mmol/L (ref 101–111)
Creatinine, Ser: 0.56 mg/dL (ref 0.44–1.00)
GFR calc Af Amer: >60 mL/min (ref >60)
GFR calc non Af Amer: >60 mL/min (ref >60)
GLUCOSE: 83 mg/dL (ref 65–99)
POTASSIUM: 3.1 mmol/L — AB (ref 3.5–5.1)
Sodium: 139 mmol/L (ref 135–145)

## 2016-04-18 LAB — RAPID URINE DRUG SCREEN, HOSP PERFORMED
AMPHETAMINES: NOT DETECTED
BARBITURATES: NOT DETECTED
BENZODIAZEPINES: POSITIVE — AB
Cocaine: NOT DETECTED
Opiates: POSITIVE — AB
TETRAHYDROCANNABINOL: POSITIVE — AB

## 2016-04-18 LAB — URINE MICROSCOPIC-ADD ON

## 2016-04-18 MED ORDER — POTASSIUM CHLORIDE CRYS ER 20 MEQ PO TBCR: 20.0000 meq | EXTENDED_RELEASE_TABLET | Freq: Two times a day (BID) | ORAL | 0 refills | Status: DC

## 2016-04-18 MED ORDER — DICYCLOMINE HCL 20 MG PO TABS: 20.0000 mg | ORAL_TABLET | Freq: Two times a day (BID) | ORAL | 0 refills | Status: DC

## 2016-04-18 MED ORDER — DICYCLOMINE HCL 10 MG PO CAPS
10.0000 mg | ORAL_CAPSULE | Freq: Once | ORAL | Status: AC
  Administered 2016-04-18: 10 mg via ORAL
  Filled 2016-04-18: qty 1

## 2016-04-18 MED ORDER — POTASSIUM CHLORIDE CRYS ER 20 MEQ PO TBCR
20.0000 meq | EXTENDED_RELEASE_TABLET | Freq: Once | ORAL | Status: AC
  Administered 2016-04-18: 20 meq via ORAL
  Filled 2016-04-18: qty 1

## 2016-04-18 MED ORDER — LORAZEPAM 2 MG/ML IJ SOLN
1.0000 mg | Freq: Once | INTRAMUSCULAR | Status: AC
  Administered 2016-04-18: 1 mg via INTRAVENOUS
  Filled 2016-04-18: qty 1

## 2016-04-18 MED ORDER — ONDANSETRON 4 MG PO TBDP: 4.0000 mg | ORAL_TABLET | Freq: Three times a day (TID) | ORAL | 0 refills | Status: DC | PRN

## 2016-04-18 MED ORDER — ALPRAZOLAM 2 MG PO TABS: 1.0000 mg | ORAL_TABLET | Freq: Two times a day (BID) | ORAL | 0 refills | Status: DC | PRN

## 2016-04-18 MED ORDER — OXYCODONE-ACETAMINOPHEN 5-325 MG PO TABS
2.0000 | ORAL_TABLET | Freq: Once | ORAL | Status: AC
  Administered 2016-04-18: 2 via ORAL
  Filled 2016-04-18: qty 2

## 2016-04-18 MED ORDER — DIPHENOXYLATE-ATROPINE 2.5-0.025 MG PO TABS: 2.0000 | ORAL_TABLET | Freq: Four times a day (QID) | ORAL | 0 refills | Status: DC | PRN

## 2016-04-18 MED ORDER — PROMETHAZINE HCL 25 MG PO TABS: 25.0000 mg | ORAL_TABLET | Freq: Four times a day (QID) | ORAL | 0 refills | Status: DC | PRN

## 2016-04-18 NOTE — ED Notes (Signed)
Patient has drunk two cokes without vomiting or feeling nauseated.

## 2016-04-18 NOTE — ED Notes (Signed)
Patient is discharged but wants to speak to PA.

## 2016-04-18 NOTE — ED Notes (Signed)
Requested patient to urinate. Patient given a cup and escorted to bathroom. Patient walked to bathroom without any difficulty.

## 2016-05-07 ENCOUNTER — Encounter (HOSPITAL_COMMUNITY): Payer: Self-pay | Admitting: Emergency Medicine

## 2016-05-07 ENCOUNTER — Emergency Department (HOSPITAL_COMMUNITY)
Admission: EM | Admit: 2016-05-07 | Discharge: 2016-05-07 | Disposition: A | Discharge status: Home / Self Care | Payer: Medicaid Other | Attending: Emergency Medicine | Admitting: Emergency Medicine

## 2016-05-07 DIAGNOSIS — R1084 Generalized abdominal pain: Secondary | ICD-10-CM | POA: Insufficient documentation

## 2016-05-07 DIAGNOSIS — Z8541 Personal history of malignant neoplasm of cervix uteri: Secondary | ICD-10-CM | POA: Insufficient documentation

## 2016-05-07 DIAGNOSIS — R102 Pelvic and perineal pain: Secondary | ICD-10-CM | POA: Diagnosis not present

## 2016-05-07 DIAGNOSIS — F17200 Nicotine dependence, unspecified, uncomplicated: Secondary | ICD-10-CM | POA: Insufficient documentation

## 2016-05-07 DIAGNOSIS — R1031 Right lower quadrant pain: Secondary | ICD-10-CM | POA: Diagnosis present

## 2016-05-07 LAB — CBC
HCT: 41.7 % (ref 36.0–46.0)
Hemoglobin: 14.4 g/dL (ref 12.0–15.0)
MCH: 31.1 pg (ref 26.0–34.0)
MCHC: 34.5 g/dL (ref 30.0–36.0)
MCV: 90.1 fL (ref 78.0–100.0)
PLATELETS: 296 10*3/uL (ref 150–400)
RBC: 4.63 MIL/uL (ref 3.87–5.11)
RDW: 13.3 % (ref 11.5–15.5)
WBC: 9.1 10*3/uL (ref 4.0–10.5)

## 2016-05-07 LAB — URINALYSIS, ROUTINE W REFLEX MICROSCOPIC
BILIRUBIN URINE: NEGATIVE
Glucose, UA: NEGATIVE mg/dL
KETONES UR: 15 mg/dL — AB
Leukocytes, UA: NEGATIVE
Nitrite: NEGATIVE
PROTEIN: NEGATIVE mg/dL
Specific Gravity, Urine: 1.022 (ref 1.005–1.030)
pH: 6 (ref 5.0–8.0)

## 2016-05-07 LAB — URINE MICROSCOPIC-ADD ON

## 2016-05-07 LAB — COMPREHENSIVE METABOLIC PANEL
ALK PHOS: 48 U/L (ref 38–126)
ALT: 7 U/L — AB (ref 14–54)
AST: 17 U/L (ref 15–41)
Albumin: 3.2 g/dL — ABNORMAL LOW (ref 3.5–5.0)
Anion gap: 5 (ref 5–15)
BUN: 7 mg/dL (ref 6–20)
CALCIUM: 8.7 mg/dL — AB (ref 8.9–10.3)
CO2: 24 mmol/L (ref 22–32)
CREATININE: 0.59 mg/dL (ref 0.44–1.00)
Chloride: 111 mmol/L (ref 101–111)
GFR calc non Af Amer: >60 mL/min (ref >60)
Glucose, Bld: 100 mg/dL — ABNORMAL HIGH (ref 65–99)
Potassium: 3.9 mmol/L (ref 3.5–5.1)
SODIUM: 140 mmol/L (ref 135–145)
Total Bilirubin: 0.4 mg/dL (ref 0.3–1.2)
Total Protein: 5.4 g/dL — ABNORMAL LOW (ref 6.5–8.1)

## 2016-05-07 LAB — LIPASE, BLOOD: Lipase: 13 U/L (ref 11–51)

## 2016-05-07 LAB — POC OCCULT BLOOD, ED: Fecal Occult Bld: NEGATIVE

## 2016-05-07 MED ORDER — DIPHENHYDRAMINE HCL 25 MG PO CAPS
25.0000 mg | ORAL_CAPSULE | Freq: Once | ORAL | Status: AC
  Administered 2016-05-07: 25 mg via ORAL
  Filled 2016-05-07: qty 1

## 2016-05-07 MED ORDER — HALOPERIDOL LACTATE 5 MG/ML IJ SOLN
2.0000 mg | Freq: Once | INTRAMUSCULAR | Status: AC
  Administered 2016-05-07: 2 mg via INTRAMUSCULAR
  Filled 2016-05-07: qty 1

## 2016-05-07 MED ORDER — ONDANSETRON 4 MG PO TBDP: 4.0000 mg | ORAL_TABLET | Freq: Three times a day (TID) | ORAL | 0 refills | Status: DC | PRN

## 2016-05-07 NOTE — ED Provider Notes (Signed)
[place] DEPT Provider Note   CSN: [id number] Arrival date & time: 05/07/16  1240     History   Chief Complaint Chief Complaint  Patient presents with  . Abdominal Pain    HPI Sonya King is a 35 y.o. female.   Abdominal Pain   This is a chronic problem. The current episode started more than 2 days ago. The problem occurs hourly. The problem has not changed since onset.The pain is associated with an unknown factor. The pain is located in the RLQ, LLQ and suprapubic region. The quality of the pain is aching and sharp. The pain is at a severity of 8/10. Associated symptoms include anorexia, diarrhea, hematochezia and nausea. Pertinent negatives include fever, melena, vomiting, dysuria, hematuria and arthralgias. Nothing aggravates the symptoms. Nothing relieves the symptoms. Past workup includes CT scan and ultrasound.    Past Medical History:  Diagnosis Date  . Anxiety   . Bipolar 1 disorder ([place])   . Cancer ([place])    cervical  . Ruptured lumbar disc     There are no active problems to display for this patient.   Past Surgical History:  Procedure Laterality Date  . ABDOMINAL HYSTERECTOMY    . CHOLECYSTECTOMY    . TONSILLECTOMY      OB History    No data available       Home Medications    Prior to Admission medications   Medication Sig Start Date End Date Taking? Authorizing Provider  alprazolam Duanne Moron) 2 MG tablet Take 0.5 tablets (1 mg total) by mouth 2 (two) times daily as needed for anxiety. 04/18/16  Yes Delsa Grana, PA-C  dicyclomine (BENTYL) 20 MG tablet Take 1 tablet (20 mg total) by mouth 2 (two) times daily. 04/18/16  Yes Delsa Grana, PA-C  ibuprofen (ADVIL,MOTRIN) 600 MG tablet Take 1 tablet (600 mg total) by mouth every 6 (six) hours as needed for mild pain or moderate pain. 04/16/16  Yes Jaime Pilcher Ward, PA-C  diphenoxylate-atropine (LOMOTIL) 2.5-0.025 MG tablet Take 2 tablets by mouth 4 (four) times daily as needed for diarrhea or  loose stools. Patient not taking: Reported on 05/07/2016 04/18/16   Delsa Grana, PA-C  doxylamine, Sleep, (UNISOM) 25 MG tablet Take 75 mg by mouth at bedtime.    Historical Provider, MD  ondansetron (ZOFRAN ODT) 4 MG disintegrating tablet Take 1 tablet (4 mg total) by mouth every 8 (eight) hours as needed for nausea or vomiting. Patient not taking: Reported on 05/07/2016 04/18/16   Delsa Grana, PA-C  ondansetron (ZOFRAN ODT) 4 MG disintegrating tablet Take 1 tablet (4 mg total) by mouth every 8 (eight) hours as needed for nausea or vomiting. 05/07/16   Dewaine Conger, MD  oxyCODONE-acetaminophen (PERCOCET) 10-325 MG tablet Take 1 tablet by mouth every 6 (six) hours as needed for pain.    Historical Provider, MD  potassium chloride SA (K-DUR,KLOR-CON) 20 MEQ tablet Take 1 tablet (20 mEq total) by mouth 2 (two) times daily. 04/18/16 04/21/16  Delsa Grana, PA-C  promethazine (PHENERGAN) 25 MG tablet Take 1 tablet (25 mg total) by mouth every 6 (six) hours as needed for nausea or vomiting. Patient not taking: Reported on 05/07/2016 04/18/16   Delsa Grana, PA-C  traMADol (ULTRAM) 50 MG tablet Take 1 tablet (50 mg total) by mouth every 6 (six) hours as needed for severe pain. Patient not taking: Reported on 05/07/2016 04/16/16   Ozella Almond Ward, PA-C    Family History No family history on file.  Social History  Social History  Substance Use Topics  . Smoking status: Current Every Day Smoker  . Smokeless tobacco: Never Used  . Alcohol use No     Allergies   Patient has no known allergies.   Review of Systems Review of Systems  Constitutional: Negative for chills and fever.  HENT: Negative for ear pain and sore throat.   Eyes: Negative for pain and visual disturbance.  Respiratory: Negative for cough and shortness of breath.   Cardiovascular: Negative for chest pain and palpitations.  Gastrointestinal: Positive for abdominal pain, anorexia, diarrhea, hematochezia and nausea. Negative for melena  and vomiting.  Genitourinary: Negative for dysuria and hematuria.  Musculoskeletal: Negative for arthralgias and back pain.  Skin: Negative for color change and rash.  Neurological: Negative for seizures and syncope.  All other systems reviewed and are negative.    Physical Exam Updated Vital Signs BP (!) 124/113 (BP Location: Left Arm)   Pulse 76   Temp 98.6 F (37 C) (Oral)   Resp 18   SpO2 99%   Physical Exam  Constitutional: She appears well-developed and well-nourished. No distress.  HENT:  Head: Normocephalic and atraumatic.  Eyes: Conjunctivae are normal.  Neck: Neck supple.  Cardiovascular: Normal rate and regular rhythm.   No murmur heard. Pulmonary/Chest: Effort normal and breath sounds normal. No respiratory distress.  Abdominal: Soft. There is tenderness in the right lower quadrant, suprapubic area and left lower quadrant. There is no rigidity, no rebound, no guarding and no CVA tenderness.  Genitourinary: Rectum normal. Rectal exam shows no external hemorrhoid, no internal hemorrhoid, no fissure, no tenderness, anal tone normal and guaiac negative stool.  Musculoskeletal: She exhibits no edema.  Neurological: She is alert.  Skin: Skin is warm and dry.  Psychiatric: She has a normal mood and affect.  Nursing note and vitals reviewed.    ED Treatments / Results  Labs (all labs ordered are listed, but only abnormal results are displayed) Labs Reviewed  COMPREHENSIVE METABOLIC PANEL - Abnormal; Notable for the following:       Result Value   Glucose, Bld 100 (*)    Calcium 8.7 (*)    Total Protein 5.4 (*)    Albumin 3.2 (*)    ALT 7 (*)    All other components within normal limits  URINALYSIS, ROUTINE W REFLEX MICROSCOPIC (NOT AT [facility]) - Abnormal; Notable for the following:    Hgb urine dipstick SMALL (*)    Ketones, ur 15 (*)    All other components within normal limits  URINE MICROSCOPIC-ADD ON - Abnormal; Notable for the following:    Squamous  Epithelial / LPF 0-5 (*)    Bacteria, UA FEW (*)    All other components within normal limits  LIPASE, BLOOD  CBC  POC OCCULT BLOOD, ED    EKG  EKG Interpretation None       Radiology No results found.  Procedures Procedures (including critical care time)  Medications Ordered in ED Medications  haloperidol lactate (HALDOL) injection 2 mg (2 mg Intramuscular Given 05/07/16 1610)  diphenhydrAMINE (BENADRYL) capsule 25 mg (25 mg Oral Given 05/07/16 1609)     Initial Impression / Assessment and Plan / ED Course  I have reviewed the triage vital signs and the nursing notes.  Pertinent labs & imaging results that were available during my care of the patient were reviewed by me and considered in my medical decision making (see chart for details).  Clinical Course    Since a 35 year old  female with chronic abdominal pelvic pain. She says the pain comes and goes and for last 3 days is been constant. She is also reporting bloody bowel movements. She's had multiple presentations to multiple emergency departments in [place] for the same. Many CT scans are extensive diagnostic workup thus far been negative. She is told she needs to follow-up with OB/GYN in gastroenterology for many of her problems the past however is been unable to do so. She states she's been here for several months but still not followed up with primary care or the specialist. She has a negative exam for bloody stools on digital rectal exam, no fissures palpated no hemorrhoids palpated. Rectal vault is empty. Abdomen is diffusely tender worse in the lower quadrants. Urine is unremarkable. No signs of kidney liver dysfunction or pancreatic dysfunction. No leukocytosis. No further imaging is needed today. She does not have a uterus, no pregnancy test needed. No vaginal discharge or pain. Patient is safe for discharge home with recommended outpatient follow-up. Of note attempted to alleviate patient's discomfort with IM  Haldol there were is minimal to no relief with this. Vital signs stable time of discharge. Strict return precautions are given.  Final Clinical Impressions(s) / ED Diagnoses   Final diagnoses:  Generalized abdominal pain  Pelvic pain in female    New Prescriptions Discharge Medication List as of 05/07/2016  5:01 PM       Dewaine Conger, MD 05/08/16 [id number]    Quintella Reichert, MD 05/11/16 [phone]

## 2016-05-07 NOTE — ED Triage Notes (Signed)
Pt seen here a month ago for lower abdominal pain, has gotten worse over the past three days. Pt also states she has blood in her stool yesterday, states it was bright red. Pt hx of ovarian cancer and has had a hysterectomy but has her ovaries still.

## 2016-06-12 ENCOUNTER — Encounter (HOSPITAL_COMMUNITY): Payer: Self-pay | Admitting: *Deleted

## 2016-06-12 ENCOUNTER — Ambulatory Visit (HOSPITAL_COMMUNITY)
Admission: EM | Admit: 2016-06-12 | Discharge: 2016-06-12 | Disposition: A | Discharge status: Home / Self Care | Payer: Medicaid Other | Attending: Family Medicine | Admitting: Family Medicine

## 2016-06-12 DIAGNOSIS — F419 Anxiety disorder, unspecified: Secondary | ICD-10-CM

## 2016-06-12 MED ORDER — ALPRAZOLAM 0.5 MG PO TABS: 0.5000 mg | ORAL_TABLET | Freq: Three times a day (TID) | ORAL | 0 refills | Status: DC | PRN

## 2016-06-12 NOTE — Discharge Instructions (Signed)
Further refills from your doctor

## 2016-06-12 NOTE — ED Notes (Signed)
Pt  Reports   Concerned   About her ovarys       She  States  They  Were  Left  I  After  Her c   Section

## 2016-06-12 NOTE — ED Triage Notes (Signed)
Pt  Reports   She   She needs  A  Refill  For  Xanax    Last  Dose   Yesterday

## 2016-06-12 NOTE — ED Provider Notes (Signed)
[name]    CSN: [id number] Arrival date & time: 06/12/16  1201     History   Chief Complaint Chief Complaint  Patient presents with  . Medication Refill    HPI Sonya King is a 36 y.o. female.   The history is provided by the patient.  Medication Refill  Medications/supplies requested:  Xanax Reason for request:  Medications ran out Medications taken before: yes - see home medications   Patient has complete original prescription information: no     Past Medical History:  Diagnosis Date  . Anxiety   . Bipolar 1 disorder ([place])   . Cancer ([place])    cervical  . Ruptured lumbar disc     There are no active problems to display for this patient.   Past Surgical History:  Procedure Laterality Date  . ABDOMINAL HYSTERECTOMY    . CHOLECYSTECTOMY    . TONSILLECTOMY      OB History    No data available       Home Medications    Prior to Admission medications   Medication Sig Start Date End Date Taking? Authorizing Provider  ALPRAZolam Duanne Moron) 0.5 MG tablet Take 1 tablet (0.5 mg total) by mouth 3 (three) times daily as needed for anxiety. 06/12/16   Billy Fischer, MD  dicyclomine (BENTYL) 20 MG tablet Take 1 tablet (20 mg total) by mouth 2 (two) times daily. 04/18/16   Delsa Grana, PA-C  diphenoxylate-atropine (LOMOTIL) 2.5-0.025 MG tablet Take 2 tablets by mouth 4 (four) times daily as needed for diarrhea or loose stools. Patient not taking: Reported on 05/07/2016 04/18/16   Delsa Grana, PA-C  doxylamine, Sleep, (UNISOM) 25 MG tablet Take 75 mg by mouth at bedtime.    Historical Provider, MD  ibuprofen (ADVIL,MOTRIN) 600 MG tablet Take 1 tablet (600 mg total) by mouth every 6 (six) hours as needed for mild pain or moderate pain. 04/16/16   Jaime Pilcher Ward, PA-C  ondansetron (ZOFRAN ODT) 4 MG disintegrating tablet Take 1 tablet (4 mg total) by mouth every 8 (eight) hours as needed for nausea or vomiting. Patient not taking: Reported on 05/07/2016  04/18/16   Delsa Grana, PA-C  ondansetron (ZOFRAN ODT) 4 MG disintegrating tablet Take 1 tablet (4 mg total) by mouth every 8 (eight) hours as needed for nausea or vomiting. 05/07/16   Dewaine Conger, MD  oxyCODONE-acetaminophen (PERCOCET) 10-325 MG tablet Take 1 tablet by mouth every 6 (six) hours as needed for pain.    Historical Provider, MD  potassium chloride SA (K-DUR,KLOR-CON) 20 MEQ tablet Take 1 tablet (20 mEq total) by mouth 2 (two) times daily. 04/18/16 04/21/16  Delsa Grana, PA-C  promethazine (PHENERGAN) 25 MG tablet Take 1 tablet (25 mg total) by mouth every 6 (six) hours as needed for nausea or vomiting. Patient not taking: Reported on 05/07/2016 04/18/16   Delsa Grana, PA-C  traMADol (ULTRAM) 50 MG tablet Take 1 tablet (50 mg total) by mouth every 6 (six) hours as needed for severe pain. Patient not taking: Reported on 05/07/2016 04/16/16   [place], PA-C    Family History History reviewed. No pertinent family history.  Social History Social History  Substance Use Topics  . Smoking status: Current Every Day Smoker  . Smokeless tobacco: Never Used  . Alcohol use No     Allergies   Patient has no known allergies.   Review of Systems Review of Systems  Constitutional: Negative.   Psychiatric/Behavioral: The patient is nervous/anxious.  Reports relapse of ov ca.  All other systems reviewed and are negative.    Physical Exam Triage Vital Signs ED Triage Vitals [06/12/16 1216]  Enc Vitals Group     BP 117/76     Pulse Rate 85     Resp 18     Temp 97.5 F (36.4 C)     Temp Source Oral     SpO2 99 %     Weight      Height      Head Circumference      Peak Flow      Pain Score      Pain Loc      Pain Edu?      Excl. in [place]?    No data found.   Updated Vital Signs BP 117/76 (BP Location: Right Arm)   Pulse 85   Temp 97.5 F (36.4 C) (Oral)   Resp 18   SpO2 99%   Visual Acuity Right Eye Distance:   Left Eye Distance:   Bilateral  Distance:    Right Eye Near:   Left Eye Near:    Bilateral Near:     Physical Exam  Constitutional: She appears well-developed and well-nourished. She appears distressed.  Psychiatric: She has a normal mood and affect.  Nursing note and vitals reviewed.    UC Treatments / Results  Labs (all labs ordered are listed, but only abnormal results are displayed) Labs Reviewed - No data to display  EKG  EKG Interpretation None       Radiology No results found.  Procedures Procedures (including critical care time)  Medications Ordered in UC Medications - No data to display   Initial Impression / Assessment and Plan / UC Course  I have reviewed the triage vital signs and the nursing notes.  Pertinent labs & imaging results that were available during my care of the patient were reviewed by me and considered in my medical decision making (see chart for details).      Final Clinical Impressions(s) / UC Diagnoses   Final diagnoses:  Anxiety    New Prescriptions Discharge Medication List as of 06/12/2016 12:31 PM       Billy Fischer, MD 07/04/16 1158

## 2016-07-07 ENCOUNTER — Ambulatory Visit (HOSPITAL_COMMUNITY)
Admission: EM | Admit: 2016-07-07 | Discharge: 2016-07-07 | Disposition: A | Discharge status: Home / Self Care | Payer: Medicaid Other | Attending: Family Medicine | Admitting: Family Medicine

## 2016-07-07 ENCOUNTER — Encounter (HOSPITAL_COMMUNITY): Payer: Self-pay | Admitting: Emergency Medicine

## 2016-07-07 DIAGNOSIS — Z760 Encounter for issue of repeat prescription: Secondary | ICD-10-CM

## 2016-07-07 NOTE — ED Provider Notes (Signed)
CSN: [id number]     Arrival date & time 07/07/16  1058 History   First MD Initiated Contact with Patient 07/07/16 1324     Chief Complaint  Patient presents with  . Medication Refill   (Consider location/radiation/quality/duration/timing/severity/associated sxs/prior Treatment) 36 year old female presents to clinic with request for medication refill. She is requesting a refill on her xanax and her percocet. She states she has a trip tomorrow to [place] and has not been able to get in with either her primary care provider or her pain management provider.   The history is provided by the patient.  Medication Refill    Past Medical History:  Diagnosis Date  . Anxiety   . Bipolar 1 disorder ([place])   . Cancer ([place])    cervical  . Ruptured lumbar disc    Past Surgical History:  Procedure Laterality Date  . ABDOMINAL HYSTERECTOMY    . CHOLECYSTECTOMY    . TONSILLECTOMY     History reviewed. No pertinent family history. Social History  Substance Use Topics  . Smoking status: Current Every Day Smoker  . Smokeless tobacco: Never Used  . Alcohol use No   OB History    No data available     Review of Systems  Reason unable to perform ROS: as covered in HPI.  All other systems reviewed and are negative.   Allergies  Patient has no known allergies.  Home Medications   Prior to Admission medications   Medication Sig Start Date End Date Taking? Authorizing Provider  ALPRAZolam Duanne Moron) 0.5 MG tablet Take 1 tablet (0.5 mg total) by mouth 3 (three) times daily as needed for anxiety. 06/12/16  Yes Billy Fischer, MD  oxyCODONE-acetaminophen (PERCOCET) 10-325 MG tablet Take 1 tablet by mouth every 6 (six) hours as needed for pain.   Yes Historical Provider, MD   Meds Ordered and Administered this Visit  Medications - No data to display  BP 119/80 (BP Location: Right Arm)   Pulse 73   Temp 97.8 F (36.6 C) (Oral)   Resp 18   SpO2 100%  No data found.   Physical Exam   Constitutional: She is oriented to person, place, and time. She appears well-developed and well-nourished. No distress.  HENT:  Head: Normocephalic and atraumatic.  Neurological: She is alert and oriented to person, place, and time.  Skin: Skin is warm and dry. Capillary refill takes less than 2 seconds. She is not diaphoretic.  Psychiatric: She has a normal mood and affect.  Nursing note and vitals reviewed.   Urgent Care Course     Procedures (including critical care time)  Labs Review Labs Reviewed - No data to display  Imaging Review No results found.   Visual Acuity Review  Right Eye Distance:   Left Eye Distance:   Bilateral Distance:    Right Eye Near:   Left Eye Near:    Bilateral Near:         MDM   1. Medication refill   We are unable to refill controlled substances in the urgent care setting. I recommend you follow up with either your primary care provider or your pain management provider. Should you have any acute signs or symptoms I recommend you contact your primary care provider, return to clinic, or seek treatment in the ER as needed.      Barnet Glasgow, NP 07/07/16 1353

## 2016-07-07 NOTE — Discharge Instructions (Signed)
We are unable to refill controlled substances in the urgent care setting. I recommend you follow up with either your primary care provider or your pain management provider. Should you have any acute signs or symptoms I recommend you contact your primary care provider, return to clinic, or seek treatment in the ER as needed.

## 2016-07-07 NOTE — ED Triage Notes (Signed)
The patient presented to the [facility] with a complaint of needing her xanax and pain pills refilled. The patient was at the [facility] for the same request on June 12, 2016.

## 2016-08-11 ENCOUNTER — Encounter (HOSPITAL_COMMUNITY): Payer: Self-pay | Admitting: Obstetrics and Gynecology

## 2016-08-11 ENCOUNTER — Inpatient Hospital Stay (HOSPITAL_COMMUNITY)
Admission: AD | Admit: 2016-08-11 | Discharge: 2016-08-11 | Discharge status: Against Medical Advice | Payer: Medicaid Other | Source: Ambulatory Visit | Attending: Obstetrics & Gynecology | Admitting: Obstetrics & Gynecology

## 2016-08-11 DIAGNOSIS — F17210 Nicotine dependence, cigarettes, uncomplicated: Secondary | ICD-10-CM | POA: Insufficient documentation

## 2016-08-11 DIAGNOSIS — Z5321 Procedure and treatment not carried out due to patient leaving prior to being seen by health care provider: Secondary | ICD-10-CM | POA: Insufficient documentation

## 2016-08-11 DIAGNOSIS — R109 Unspecified abdominal pain: Secondary | ICD-10-CM | POA: Diagnosis not present

## 2016-08-11 DIAGNOSIS — Z90710 Acquired absence of both cervix and uterus: Secondary | ICD-10-CM | POA: Diagnosis not present

## 2016-08-11 HISTORY — DX: Malingerer (conscious simulation): Z76.5

## 2016-08-11 LAB — URINALYSIS, ROUTINE W REFLEX MICROSCOPIC
Bacteria, UA: NONE SEEN
Bilirubin Urine: NEGATIVE
Glucose, UA: NEGATIVE mg/dL
Ketones, ur: NEGATIVE mg/dL
Nitrite: NEGATIVE
Protein, ur: NEGATIVE mg/dL
Specific Gravity, Urine: 1.004 — ABNORMAL LOW (ref 1.005–1.030)
pH: 6 (ref 5.0–8.0)

## 2016-08-11 LAB — RAPID URINE DRUG SCREEN, HOSP PERFORMED
AMPHETAMINES: NOT DETECTED
BARBITURATES: NOT DETECTED
BENZODIAZEPINES: POSITIVE — AB
COCAINE: NOT DETECTED
OPIATES: POSITIVE — AB
TETRAHYDROCANNABINOL: NOT DETECTED

## 2016-08-11 MED ORDER — KETOROLAC TROMETHAMINE 60 MG/2ML IM SOLN: 60.0000 mg | Freq: Once | INTRAMUSCULAR | Status: DC

## 2016-08-11 NOTE — MAU Note (Signed)
Pt complains of constant abdominal pain 8/10 that has gone on for months. States she had a hysterectomy and has had ovarian pain and can not get in to see her primary care or a GYN. Her GYN that did the surgery is in [place]. States her primary here said she needs a referral to see a GYN. She states the Vicodin isn't working and she took her last one today at [facility].

## 2016-08-11 NOTE — MAU Note (Signed)
Pt leaving AMA, states she knows how to treat a UTI herself and also did not appreciate the way she was talked to. I asked if she wanted me to give her the pain medicine that was ordered and she said no. States she will go see another doctor.

## 2016-08-11 NOTE — MAU Provider Note (Signed)
History     CSN: [id number]  Arrival date and time: 08/11/16 1325   First Provider Initiated Contact with Patient 08/11/16 1428      Chief Complaint  Patient presents with  . Abdominal Pain   HPI   Ms.Sonya King is a 36 y.o. female with a history of abdominal hyperectomy G4P4000 here in MAU with abdominal pain. This pain is chronic in nature. She is interested in a referral to a GYN to have her ovaries removed. She was diagnosed with cervical CA  (stage 2) and later had a Hysterectomy in 2014 done by a Dr. Pandora Leiter in [facility] She thought she was having "total hysterectomy" and later found out that her ovaries were left. She is upset about this. The patient is here for pain medication and for a referral to a GYN to remove her ovaries. Says she is getting married on Saturday and is hoping to have this taken care of.  She had an old RX for Vicodin at home; this was given to her 2 months ago from urgent care. She has run out of pain medication and is requesting a refill.     OB History    No data available      Past Medical History:  Diagnosis Date  . Anxiety   . Bipolar 1 disorder ([place])   . Cancer ([place])    cervical  . Ruptured lumbar disc     Past Surgical History:  Procedure Laterality Date  . ABDOMINAL HYSTERECTOMY    . CHOLECYSTECTOMY    . TONSILLECTOMY      No family history on file.  Social History  Substance Use Topics  . Smoking status: Current Every Day Smoker  . Smokeless tobacco: Never Used  . Alcohol use No    Allergies:  Allergies  Allergen Reactions  . Avocado Itching and Swelling    Throat swelling     Prescriptions Prior to Admission  Medication Sig Dispense Refill Last Dose  . ALPRAZolam (XANAX) 0.5 MG tablet Take 1 tablet (0.5 mg total) by mouth 3 (three) times daily as needed for anxiety. 30 tablet 0 08/10/2016 at Unknown time  . ibuprofen (ADVIL,MOTRIN) 200 MG tablet Take 800 mg by mouth every 6 (six) hours as needed for mild pain  or moderate pain.   08/11/2016 at Unknown time  . oxyCODONE-acetaminophen (PERCOCET) 10-325 MG tablet Take 1 tablet by mouth every 6 (six) hours as needed for pain.   08/11/2016 at Unknown time  . zolpidem (AMBIEN) 10 MG tablet Take 1 tablet by mouth at bedtime as needed.  0 08/10/2016 at Unknown time   Results for orders placed or performed during the hospital encounter of 08/11/16 (from the past 48 hour(s))  Urinalysis, Routine w reflex microscopic     Status: Abnormal   Collection Time: 08/11/16  1:39 PM  Result Value Ref Range   Color, Urine STRAW (A) YELLOW   APPearance CLEAR CLEAR   Specific Gravity, Urine 1.004 (L) 1.005 - 1.030   pH 6.0 5.0 - 8.0   Glucose, UA NEGATIVE NEGATIVE mg/dL   Hgb urine dipstick SMALL (A) NEGATIVE   Bilirubin Urine NEGATIVE NEGATIVE   Ketones, ur NEGATIVE NEGATIVE mg/dL   Protein, ur NEGATIVE NEGATIVE mg/dL   Nitrite NEGATIVE NEGATIVE   Leukocytes, UA MODERATE (A) NEGATIVE   RBC / HPF 0-5 0 - 5 RBC/hpf   WBC, UA 6-30 0 - 5 WBC/hpf   Bacteria, UA NONE SEEN NONE SEEN   Squamous Epithelial /  LPF 0-5 (A) NONE SEEN   Mucous PRESENT     Review of Systems  Constitutional: Negative for fever.  Genitourinary: Positive for dysuria and urgency. Negative for vaginal bleeding.   Physical Exam   Blood pressure 112/70, pulse 82, temperature 97.5 F (36.4 C), resp. rate 20.  Physical Exam  Constitutional: She is oriented to person, place, and time. She appears well-developed and well-nourished. No distress.  Musculoskeletal: Normal range of motion.  Neurological: She is alert and oriented to person, place, and time.  Skin: She is not diaphoretic.  Psychiatric: Her speech is slurred. Cognition and memory are impaired.   MAU Course  Procedures  None  MDM  [facility] primary care MD The patient has had multiple ER/urgent care visits requesting pain medication refills. The patient appears under the influence today.   Assessment and Plan    Toradol 60 mg IM ordered CBC CMP Lipase.  I Intended to do full GYN exam today. When I stepped out of the room to ask for RN assistance the patient packed her stuff and left the room.  Narcotic medication was not given to this patient today. I am highly suspicious for drug seeking behavior. [place] controlled substance reporting system shows Ambien and xanax. Patient recently moved her from [place].    Lezlie Lye, NP 08/11/2016 3:21 PM

## 2016-08-11 NOTE — MAU Provider Note (Signed)
  History     CSN: [id number]  Arrival date and time: 08/11/16 1325  Pain since hysterectomy 3.5 years ago. Pain worse in past 2 months.     Chief Complaint  Patient presents with  . Abdominal Pain   HPI  36 y/o caucasian female G14P4 with a hx significant for anxiety, ceverical cx s/p hysterectomy, and chronic abdominal pain who presents today for sharp, severe periumbilical abdominal pain. She states the pain has been present for the past 3.5 years but has worsened in the past 1-2 months. She states its sometimes worse after eating. She has been taking Xanax and Vicodin to relieve the pain. She received these from a previous provider in [place]. She states she took her last pain pills this morning and cannot deal with the pain anymore. The pain limits her daily activities and she has to lay around most of the time. She also has been having pain during sex and is unable to have sex recently due to pain. She reports having a workup recently for STDs which were all negative. She has had 3 episodes of vomiting per day for the past 3 days. Denies any change in bowel movement. No blood in stool or vomit. Admits to white discharge from vagina. No blood. Admits to pain with urination. No increased frequency.  She admits to smoking 2 packs per week. No alcohol or IV drug use.   OB History    Gravida Para Term Preterm AB Living   4 4 4  0 0 0   SAB TAB Ectopic Multiple Live Births   0 0 0 0 4      Past Medical History:  Diagnosis Date  . Anxiety   . Bipolar 1 disorder ([place])   . Cancer ([place])    cervical  . Ruptured lumbar disc     Past Surgical History:  Procedure Laterality Date  . ABDOMINAL HYSTERECTOMY    . CHOLECYSTECTOMY    . TONSILLECTOMY      No family history on file.  Social History  Substance Use Topics  . Smoking status: Current Every Day Smoker  . Smokeless tobacco: Never Used  . Alcohol use No    Allergies: No Known Allergies  Prescriptions Prior to Admission   Medication Sig Dispense Refill Last Dose  . ALPRAZolam (XANAX) 0.5 MG tablet Take 1 tablet (0.5 mg total) by mouth 3 (three) times daily as needed for anxiety. 30 tablet 0 Past Week at Unknown time  . oxyCODONE-acetaminophen (PERCOCET) 10-325 MG tablet Take 1 tablet by mouth every 6 (six) hours as needed for pain.   Past Week at Unknown time    Review of Systems Physical Exam   There were no vitals taken for this visit.  Physical Exam  Constitutional: She is oriented to person, place, and time. She appears well-developed and well-nourished.  HENT:  Head: Normocephalic and atraumatic.  Cardiovascular: Normal rate, regular rhythm and normal heart sounds.   Respiratory: Effort normal and breath sounds normal.  GI: Soft. There is tenderness.  Neurological: She is alert and oriented to person, place, and time.  Skin: Skin is warm and dry.  Psychiatric:  Upset and Crying     MAU Course  Procedures  MDM Would like to perform CBC, CMP, Lipase. Pt declined any STD testing. Previous imaging for pain for not significant.   Assessment and Plan  Pt left AMA.  Arturo Morton 08/11/2016, 1:46 PM

## 2016-08-18 ENCOUNTER — Emergency Department (HOSPITAL_COMMUNITY)
Admission: EM | Admit: 2016-08-18 | Discharge: 2016-08-18 | Disposition: A | Discharge status: Home / Self Care | Payer: Medicaid Other | Attending: Emergency Medicine | Admitting: Emergency Medicine

## 2016-08-18 ENCOUNTER — Encounter (HOSPITAL_COMMUNITY): Payer: Self-pay | Admitting: Family Medicine

## 2016-08-18 DIAGNOSIS — G8929 Other chronic pain: Secondary | ICD-10-CM | POA: Insufficient documentation

## 2016-08-18 DIAGNOSIS — Z79899 Other long term (current) drug therapy: Secondary | ICD-10-CM | POA: Insufficient documentation

## 2016-08-18 DIAGNOSIS — F17210 Nicotine dependence, cigarettes, uncomplicated: Secondary | ICD-10-CM | POA: Insufficient documentation

## 2016-08-18 DIAGNOSIS — R1084 Generalized abdominal pain: Secondary | ICD-10-CM | POA: Diagnosis not present

## 2016-08-18 DIAGNOSIS — Z8541 Personal history of malignant neoplasm of cervix uteri: Secondary | ICD-10-CM | POA: Insufficient documentation

## 2016-08-18 DIAGNOSIS — R109 Unspecified abdominal pain: Secondary | ICD-10-CM

## 2016-08-18 LAB — COMPREHENSIVE METABOLIC PANEL
ALK PHOS: 87 U/L (ref 38–126)
ALT: 10 U/L — AB (ref 14–54)
AST: 20 U/L (ref 15–41)
Albumin: 4 g/dL (ref 3.5–5.0)
Anion gap: 7 (ref 5–15)
BUN: 6 mg/dL (ref 6–20)
CALCIUM: 8.9 mg/dL (ref 8.9–10.3)
CO2: 27 mmol/L (ref 22–32)
CREATININE: 0.73 mg/dL (ref 0.44–1.00)
Chloride: 104 mmol/L (ref 101–111)
GFR calc non Af Amer: >60 mL/min (ref >60)
Glucose, Bld: 90 mg/dL (ref 65–99)
Potassium: 3.9 mmol/L (ref 3.5–5.1)
SODIUM: 138 mmol/L (ref 135–145)
Total Bilirubin: 0.6 mg/dL (ref 0.3–1.2)
Total Protein: 7.2 g/dL (ref 6.5–8.1)

## 2016-08-18 LAB — CBC
HCT: 43.3 % (ref 36.0–46.0)
Hemoglobin: 14.7 g/dL (ref 12.0–15.0)
MCH: 31.1 pg (ref 26.0–34.0)
MCHC: 33.9 g/dL (ref 30.0–36.0)
MCV: 91.7 fL (ref 78.0–100.0)
PLATELETS: 269 10*3/uL (ref 150–400)
RBC: 4.72 MIL/uL (ref 3.87–5.11)
RDW: 13.6 % (ref 11.5–15.5)
WBC: 11.7 10*3/uL — ABNORMAL HIGH (ref 4.0–10.5)

## 2016-08-18 LAB — LIPASE, BLOOD: Lipase: 13 U/L (ref 11–51)

## 2016-08-18 MED ORDER — GI COCKTAIL ~~LOC~~
30.0000 mL | Freq: Once | ORAL | Status: AC
  Administered 2016-08-18: 30 mL via ORAL
  Filled 2016-08-18: qty 30

## 2016-08-18 MED ORDER — IBUPROFEN 800 MG PO TABS
800.0000 mg | ORAL_TABLET | Freq: Once | ORAL | Status: AC
  Administered 2016-08-18: 800 mg via ORAL
  Filled 2016-08-18: qty 1

## 2016-08-18 MED ORDER — ACETAMINOPHEN 500 MG PO TABS
1000.0000 mg | ORAL_TABLET | Freq: Once | ORAL | Status: AC
  Administered 2016-08-18: 1000 mg via ORAL
  Filled 2016-08-18: qty 2

## 2016-08-18 MED ORDER — DICYCLOMINE HCL 10 MG PO CAPS
10.0000 mg | ORAL_CAPSULE | Freq: Once | ORAL | Status: AC
  Administered 2016-08-18: 10 mg via ORAL
  Filled 2016-08-18: qty 1

## 2016-08-18 MED ORDER — DIAZEPAM 2 MG PO TABS
2.0000 mg | ORAL_TABLET | Freq: Once | ORAL | Status: AC
  Administered 2016-08-18: 2 mg via ORAL
  Filled 2016-08-18: qty 1

## 2016-08-18 NOTE — ED Triage Notes (Signed)
Patient is complaining of lower abd pain from her cervical cancer. Reports nausea, vomiting, constipation, cramping, loss of appetite, gas, and heartburn. Denies fever today. Pt reports she is in the middle of waiting for a new Medicaid card for a new primary care physician. Denies having a oncologist.

## 2016-08-18 NOTE — ED Provider Notes (Signed)
[facility] DEPT Provider Note   CSN: [id number] Arrival date & time: 08/18/16  1850     History   Chief Complaint Chief Complaint  Patient presents with  . Abdominal Pain    HPI Sonya King is a 36 y.o. female.  36 yo F with a chief complaint of chronic abdominal pain. This is been an ongoing issue for this patient for many years. She is seen multiple providers for this. [facility] past [facility] and did not like the way they're taking care of her so she left. She thinks that her OB/GYN should have removed her ovaries but did not. She thinks this is likely the issue. She has had multiple ED visits for the same here. Describes the pain is constant nothing seems to make it better worsening with palpation movement walking. Denies constipation. Denies urinary complaint.   The history is provided by the patient.  Abdominal Pain   This is a chronic problem. The current episode started more than 1 week ago. The problem occurs constantly. The problem has not changed since onset.The pain is associated with an unknown factor. The pain is located in the generalized abdominal region. The quality of the pain is cramping and sharp. The pain is at a severity of 6/10. The pain is moderate. Pertinent negatives include fever, nausea, vomiting, dysuria, headaches, arthralgias and myalgias. The symptoms are aggravated by palpation, certain positions and activity. Nothing relieves the symptoms.    Past Medical History:  Diagnosis Date  . Anxiety   . Bipolar 1 disorder ([place])   . Cancer ([place])    cervical  . Drug-seeking behavior   . Ruptured lumbar disc     There are no active problems to display for this patient.   Past Surgical History:  Procedure Laterality Date  . ABDOMINAL HYSTERECTOMY    . CHOLECYSTECTOMY    . TONSILLECTOMY      OB History    Gravida Para Term Preterm AB Living   4 4 4  0 0 0   SAB TAB Ectopic Multiple Live Births   0 0 0 0  4       Home Medications    Prior to Admission medications   Medication Sig Start Date End Date Taking? Authorizing Provider  ALPRAZolam Duanne Moron) 0.5 MG tablet Take 1 tablet (0.5 mg total) by mouth 3 (three) times daily as needed for anxiety. 06/12/16  Yes Billy Fischer, MD  ibuprofen (ADVIL,MOTRIN) 200 MG tablet Take 1,200 mg by mouth every 6 (six) hours as needed for mild pain or moderate pain.    Yes Historical Provider, MD  oxyCODONE-acetaminophen (PERCOCET) 10-325 MG tablet Take 1 tablet by mouth every 6 (six) hours as needed for pain.   Yes Historical Provider, MD  zolpidem (AMBIEN) 10 MG tablet Take 1 tablet by mouth at bedtime as needed. 08/06/16  Yes Historical Provider, MD    Family History History reviewed. No pertinent family history.  Social History Social History  Substance Use Topics  . Smoking status: Current Every Day Smoker    Packs/day: 0.25    Types: Cigarettes  . Smokeless tobacco: Never Used  . Alcohol use No     Allergies   Avocado   Review of Systems Review of Systems  Constitutional: Negative for chills and fever.  HENT: Negative for congestion and rhinorrhea.   Eyes: Negative for redness and visual disturbance.  Respiratory: Negative for shortness of breath and wheezing.   Cardiovascular: Negative  for chest pain and palpitations.  Gastrointestinal: Positive for abdominal pain. Negative for nausea and vomiting.  Genitourinary: Negative for dysuria and urgency.  Musculoskeletal: Negative for arthralgias and myalgias.  Skin: Negative for pallor and wound.  Neurological: Negative for dizziness and headaches.     Physical Exam Updated Vital Signs BP 101/75 (BP Location: Right Arm)   Pulse 74   Temp 97.7 F (36.5 C) (Oral)   Resp 16   Ht 5\' 4"  (1.626 m)   Wt 162 lb (73.5 kg)   SpO2 98%   BMI 27.81 kg/m   Physical Exam  Constitutional: She is oriented to person, place, and time. She appears well-developed and well-nourished. No distress.    HENT:  Head: Normocephalic and atraumatic.  Eyes: EOM are normal. Pupils are equal, round, and reactive to light.  Neck: Normal range of motion. Neck supple.  Cardiovascular: Normal rate and regular rhythm.  Exam reveals no gallop and no friction rub.   No murmur heard. Pulmonary/Chest: Effort normal. She has no wheezes. She has no rales.  Abdominal: Soft. She exhibits no distension and no mass. There is no tenderness. There is no guarding.  Benign abdominal exam  Musculoskeletal: She exhibits no edema or tenderness.  Neurological: She is alert and oriented to person, place, and time.  Patient appears intoxicated  Skin: Skin is warm and dry. She is not diaphoretic.  Psychiatric: She has a normal mood and affect. Her behavior is normal.  Nursing note and vitals reviewed.    ED Treatments / Results  Labs (all labs ordered are listed, but only abnormal results are displayed) Labs Reviewed  COMPREHENSIVE METABOLIC PANEL - Abnormal; Notable for the following:       Result Value   ALT 10 (*)    All other components within normal limits  CBC - Abnormal; Notable for the following:    WBC 11.7 (*)    All other components within normal limits  LIPASE, BLOOD  URINALYSIS, ROUTINE W REFLEX MICROSCOPIC    EKG  EKG Interpretation None       Radiology No results found.  Procedures Procedures (including critical care time)  Medications Ordered in ED Medications  acetaminophen (TYLENOL) tablet 1,000 mg (1,000 mg Oral Given 08/18/16 1957)  ibuprofen (ADVIL,MOTRIN) tablet 800 mg (800 mg Oral Given 08/18/16 1957)  dicyclomine (BENTYL) capsule 10 mg (10 mg Oral Given 08/18/16 1957)  gi cocktail (Maalox,Lidocaine,Donnatal) (30 mLs Oral Given 08/18/16 1957)  diazepam (VALIUM) tablet 2 mg (2 mg Oral Given 08/18/16 1957)     Initial Impression / Assessment and Plan / ED Course  I have reviewed the triage vital signs and the nursing notes.  Pertinent labs & imaging results that were  available during my care of the patient were reviewed by me and considered in my medical decision making (see chart for details).     36 yo F With a chief complaint of chronic abdominal pain. Labs unremarkable, patient declining UA.  D/c home.   9:22 PM:  I have discussed the diagnosis/risks/treatment options with the patient and believe the pt to be eligible for discharge home to follow-up with GYN, GI. We also discussed returning to the ED immediately if new or worsening sx occur. We discussed the sx which are most concerning (e.g., sudden worsening pain, fever, inability to tolerate by mouth) that necessitate immediate return. Medications administered to the patient during their visit and any new prescriptions provided to the patient are listed below.  Medications given during this  visit Medications  acetaminophen (TYLENOL) tablet 1,000 mg (1,000 mg Oral Given 08/18/16 1957)  ibuprofen (ADVIL,MOTRIN) tablet 800 mg (800 mg Oral Given 08/18/16 1957)  dicyclomine (BENTYL) capsule 10 mg (10 mg Oral Given 08/18/16 1957)  gi cocktail (Maalox,Lidocaine,Donnatal) (30 mLs Oral Given 08/18/16 1957)  diazepam (VALIUM) tablet 2 mg (2 mg Oral Given 08/18/16 1957)     The patient appears reasonably screen and/or stabilized for discharge and I doubt any other medical condition or other [facility] requiring further screening, evaluation, or treatment in the ED at this time prior to discharge.    Final Clinical Impressions(s) / ED Diagnoses   Final diagnoses:  Chronic abdominal pain    New Prescriptions Discharge Medication List as of 08/18/2016  8:41 PM       Deno Etienne, DO 08/18/16 2122

## 2016-08-18 NOTE — ED Notes (Signed)
Pt said she just went to the bathroom so is unable to provide a urine sample

## 2016-08-18 NOTE — Discharge Instructions (Signed)
Follow up with a PCP. Follow up with OBGYN.

## 2016-08-18 NOTE — ED Notes (Signed)
MD notified about patient not wanting to give urine sample. States she does not have any symptoms of infection.

## 2016-09-21 ENCOUNTER — Ambulatory Visit (INDEPENDENT_AMBULATORY_CARE_PROVIDER_SITE_OTHER): Payer: Medicaid Other | Admitting: Obstetrics and Gynecology

## 2016-09-21 ENCOUNTER — Encounter: Payer: Self-pay | Admitting: Obstetrics and Gynecology

## 2016-09-21 VITALS — BP 123/82 | HR 87 | Wt 170.6 lb

## 2016-09-21 DIAGNOSIS — N3020 Other chronic cystitis without hematuria: Secondary | ICD-10-CM | POA: Diagnosis not present

## 2016-09-21 DIAGNOSIS — R102 Pelvic and perineal pain: Secondary | ICD-10-CM

## 2016-09-21 MED ORDER — NITROFURANTOIN MONOHYD MACRO 100 MG PO CAPS: 100.0000 mg | ORAL_CAPSULE | Freq: Two times a day (BID) | ORAL | 0 refills | Status: DC

## 2016-09-21 NOTE — Progress Notes (Signed)
Patient reports she had hysterectomy in 2015- she has been having pain for a couple months. Patient reports pain is constant- severity fluctuates. Patient relates the pain to her ovaries.

## 2016-09-21 NOTE — Progress Notes (Signed)
36 yo female [id number] presents with c/o chronic pelvic pain for 6 months. Pt has been seen several times in the ER/MAU for same complaint. Work up has been unremarkable including GYN U/S and CT scan  She reports TAH in 2014/15 in [place] for pelvic pain. However review of records demonstrate that she has reported that TAH was performed d/t Stage 2 cervical cancer.  Pt reports pain had resolved after surgery until about 6 months ago. Reports pain is lower abd, constant, sharp, stabbing, crampy. Pain with intercourse as well. Denies any F/C/N/N bowel or bladder dysfunction.  Has tried NSAIDs and narcotic pain meds with some relief. Takes Ambien for insomnia.  1/2 ppd smoker  PE AF VSS WF in NAD  Lungs clear Heart RRR Abd soft + BS min RLQ tenderness, no rebound, no masses, sugerical scar noted GU Nl EGBUS, cervix absent, no vaginal discharge, bladder tenderness, no adnexal masses or tenderness  A/P Chronic pelvic pain        Chronic cystitis  Lengthy discussion with pt in regards to chronic pelvic pain and potential causes including bowel, bladder, scar tissue and unknown. Explained to pt that at this point in time that removal of her ovaries is not indicated. Also discussed potential health impact on her with removal of ovaries, including menopause, heart and bone, plus may not resolve her pain.  I feel that pt's pain is related to her bladder and will treat as such. Macrobid x 7 days and then qhs x 21 days. Samples of Uribel x 7 days with U/R reviewed. Will also have her sign for release of medical records from [facility]. Follow up in 6 weeks

## 2016-09-23 LAB — URINE CULTURE

## 2016-10-21 ENCOUNTER — Telehealth: Payer: Self-pay | Admitting: *Deleted

## 2016-10-21 NOTE — Telephone Encounter (Signed)
Pt called to office in severe pain asking for sooner appt to be seen. Pt sounds that she is in tears from pain. Pt has appt scheduled for 11/02/16 with Dr Rip Harbour. Pt advised [facility] is that [facility] Pt states that when she has been there is nothing they can do for her, tell her to see GYN. Pt states medication that was given at last appt has not helped, treated for UTI.  Pt made aware no current open appts available. Pt made aware info will be sent to scheduling to verify if sooner appt could be made and she will be contacted. Pt advised to call office if she has not heard back regarding appt.  Pt states understanding.

## 2016-11-02 ENCOUNTER — Ambulatory Visit: Payer: Self-pay | Admitting: Obstetrics and Gynecology

## 2016-11-10 ENCOUNTER — Ambulatory Visit: Payer: Self-pay | Admitting: Obstetrics and Gynecology

## 2016-12-01 ENCOUNTER — Ambulatory Visit: Payer: Self-pay | Admitting: Obstetrics and Gynecology

## 2017-03-11 ENCOUNTER — Emergency Department (HOSPITAL_COMMUNITY)
Admission: EM | Admit: 2017-03-11 | Discharge: 2017-03-11 | Disposition: A | Discharge status: Home / Self Care | Payer: Medicaid Other | Attending: Emergency Medicine | Admitting: Emergency Medicine

## 2017-03-11 ENCOUNTER — Emergency Department (HOSPITAL_COMMUNITY): Payer: Medicaid Other

## 2017-03-11 ENCOUNTER — Encounter (HOSPITAL_COMMUNITY): Payer: Self-pay

## 2017-03-11 DIAGNOSIS — Z5321 Procedure and treatment not carried out due to patient leaving prior to being seen by health care provider: Secondary | ICD-10-CM | POA: Diagnosis not present

## 2017-03-11 DIAGNOSIS — F17210 Nicotine dependence, cigarettes, uncomplicated: Secondary | ICD-10-CM | POA: Diagnosis not present

## 2017-03-11 DIAGNOSIS — Z8541 Personal history of malignant neoplasm of cervix uteri: Secondary | ICD-10-CM | POA: Diagnosis not present

## 2017-03-11 DIAGNOSIS — G8929 Other chronic pain: Secondary | ICD-10-CM | POA: Diagnosis not present

## 2017-03-11 DIAGNOSIS — Z79899 Other long term (current) drug therapy: Secondary | ICD-10-CM | POA: Diagnosis not present

## 2017-03-11 DIAGNOSIS — R1084 Generalized abdominal pain: Secondary | ICD-10-CM | POA: Diagnosis not present

## 2017-03-11 LAB — CBC
HCT: 37.2 % (ref 36.0–46.0)
Hemoglobin: 11.9 g/dL — ABNORMAL LOW (ref 12.0–15.0)
MCH: 29.5 pg (ref 26.0–34.0)
MCHC: 32 g/dL (ref 30.0–36.0)
MCV: 92.1 fL (ref 78.0–100.0)
PLATELETS: 248 10*3/uL (ref 150–400)
RBC: 4.04 MIL/uL (ref 3.87–5.11)
RDW: 14.3 % (ref 11.5–15.5)
WBC: 6.4 10*3/uL (ref 4.0–10.5)

## 2017-03-11 LAB — URINALYSIS, ROUTINE W REFLEX MICROSCOPIC
Bilirubin Urine: NEGATIVE
GLUCOSE, UA: NEGATIVE mg/dL
KETONES UR: NEGATIVE mg/dL
NITRITE: NEGATIVE
PH: 6 (ref 5.0–8.0)
Protein, ur: NEGATIVE mg/dL
SPECIFIC GRAVITY, URINE: 1.006 (ref 1.005–1.030)

## 2017-03-11 LAB — COMPREHENSIVE METABOLIC PANEL
ALT: 8 U/L — ABNORMAL LOW (ref 14–54)
AST: 16 U/L (ref 15–41)
Albumin: 3.1 g/dL — ABNORMAL LOW (ref 3.5–5.0)
Alkaline Phosphatase: 54 U/L (ref 38–126)
Anion gap: 7 (ref 5–15)
BUN: <5 mg/dL — ABNORMAL LOW (ref 6–20)
CO2: 24 mmol/L (ref 22–32)
CREATININE: 0.64 mg/dL (ref 0.44–1.00)
Calcium: 8.4 mg/dL — ABNORMAL LOW (ref 8.9–10.3)
Chloride: 104 mmol/L (ref 101–111)
GLUCOSE: 89 mg/dL (ref 65–99)
POTASSIUM: 3.8 mmol/L (ref 3.5–5.1)
Sodium: 135 mmol/L (ref 135–145)
TOTAL PROTEIN: 5.7 g/dL — AB (ref 6.5–8.1)
Total Bilirubin: 0.5 mg/dL (ref 0.3–1.2)

## 2017-03-11 LAB — I-STAT BETA HCG BLOOD, ED (MC, WL, AP ONLY): I-stat hCG, quantitative: <5 m[IU]/mL (ref <5)

## 2017-03-11 LAB — LIPASE, BLOOD: LIPASE: 20 U/L (ref 11–51)

## 2017-03-11 MED ORDER — KETOROLAC TROMETHAMINE 30 MG/ML IJ SOLN
15.0000 mg | Freq: Once | INTRAMUSCULAR | Status: AC
  Administered 2017-03-11: 15 mg via INTRAVENOUS
  Filled 2017-03-11: qty 1

## 2017-03-11 MED ORDER — ONDANSETRON HCL 4 MG/2ML IJ SOLN
4.0000 mg | Freq: Once | INTRAMUSCULAR | Status: AC
  Administered 2017-03-11: 4 mg via INTRAVENOUS
  Filled 2017-03-11: qty 2

## 2017-03-11 NOTE — ED Notes (Signed)
Pt states she has an emergency at home and needs to leave right away. Pt requests for her IV to be taken out.

## 2017-03-11 NOTE — ED Triage Notes (Signed)
Per Pt, Pt is coming from home with complaints of right abdominal pain that started three days ago. Reports original pain to be a dull ache that change to a sharp pain that radiates to her left side. Reports some nausea and diarrhea,  Vaginal discharge.

## 2017-03-11 NOTE — ED Provider Notes (Signed)
[facility] DEPT Provider Note   CSN: [id number] Arrival date & time: 03/11/17  1108     History   Chief Complaint Chief Complaint  Patient presents with  . Abdominal Pain    HPI Sonya King is a 36 y.o. female who presents with right sided abdominal pain. PMH significant for chronic abdominal pain, Suboxone dependence, anxiety, bipolar d/o, insomnia. She states that the pain has been intermittent for the past three days. It initially was dull/achy in the RLQ and is now sharp and radiates across her lower abdomen and to her right flank. She reports a subjective fever yesterday as well as N/V/D. No chills, chest pain, SOB, bloody stools, dysuria. Past surgical hx of cholecystectomy and hysterectomy.   HPI  Past Medical History:  Diagnosis Date  . Anxiety   . Bipolar 1 disorder ([place])   . Cancer ([place])    cervical  . Drug-seeking behavior   . Ruptured lumbar disc     Patient Active Problem List   Diagnosis Date Noted  . Cystitis, chronic 09/21/2016    Past Surgical History:  Procedure Laterality Date  . ABDOMINAL HYSTERECTOMY    . CHOLECYSTECTOMY    . TONSILLECTOMY      OB History    Gravida Para Term Preterm AB Living   4 4 4  0 0 0   SAB TAB Ectopic Multiple Live Births   0 0 0 0 4       Home Medications    Prior to Admission medications   Medication Sig Start Date End Date Taking? Authorizing Provider  ibuprofen (ADVIL,MOTRIN) 200 MG tablet Take 1,200 mg by mouth every 6 (six) hours as needed for mild pain or moderate pain.     [provider]  nitrofurantoin, macrocrystal-monohydrate, (MACROBID) 100 MG capsule Take 1 capsule (100 mg total) by mouth 2 (two) times daily. After completing twice a day dosing take 1 tablet qhs x 21 days 09/21/16   Chancy Milroy, MD  zolpidem (AMBIEN) 10 MG tablet Take 1 tablet by mouth at bedtime as needed. 08/06/16   [provider]    Family History No family history on file.  Social  History Social History  Substance Use Topics  . Smoking status: Current Every Day Smoker    Packs/day: 0.50    Types: Cigarettes  . Smokeless tobacco: Never Used  . Alcohol use No     Allergies   Avocado   Review of Systems Review of Systems  Constitutional: Positive for appetite change and fever (subjective).  Respiratory: Negative for shortness of breath.   Cardiovascular: Negative for chest pain.  Gastrointestinal: Positive for abdominal pain, diarrhea, nausea and vomiting. Negative for blood in stool and constipation.  Genitourinary: Positive for flank pain. Negative for dysuria.  All other systems reviewed and are negative.    Physical Exam Updated Vital Signs BP 90/64 (BP Location: Right Arm)   Pulse 64   Temp 98.2 F (36.8 C) (Oral)   Resp 16   Ht 5\' 4"  (1.626 m)   Wt 67.1 kg (148 lb)   SpO2 98%   BMI 25.40 kg/m   Physical Exam  Constitutional: She is oriented to person, place, and time. She appears well-developed and well-nourished. No distress.  Appears uncomfortable and older than stated age. NAD  HENT:  Head: Normocephalic and atraumatic.  Eyes: Pupils are equal, round, and reactive to light. Conjunctivae are normal. Right eye exhibits no discharge. Left eye exhibits no discharge. No scleral icterus.  Neck: Normal range of motion.  Cardiovascular: Normal rate and regular rhythm.  Exam reveals no gallop and no friction rub.   No murmur heard. Pulmonary/Chest: Effort normal and breath sounds normal. No respiratory distress. She has no wheezes. She has no rales. She exhibits no tenderness.  Abdominal: Soft. Bowel sounds are normal. She exhibits no distension and no mass. There is tenderness. There is no rebound and no guarding. No hernia.  Diffuse tenderness. Bilateral CVA tenderness  Neurological: She is alert and oriented to person, place, and time.  Skin: Skin is warm and dry.  Psychiatric: She has a normal mood and affect. Her behavior is normal.   Nursing note and vitals reviewed.    ED Treatments / Results  Labs (all labs ordered are listed, but only abnormal results are displayed) Labs Reviewed  COMPREHENSIVE METABOLIC PANEL - Abnormal; Notable for the following:       Result Value   BUN <5 (*)    Calcium 8.4 (*)    Total Protein 5.7 (*)    Albumin 3.1 (*)    ALT 8 (*)    All other components within normal limits  CBC - Abnormal; Notable for the following:    Hemoglobin 11.9 (*)    All other components within normal limits  URINALYSIS, ROUTINE W REFLEX MICROSCOPIC - Abnormal; Notable for the following:    Hgb urine dipstick SMALL (*)    Leukocytes, UA MODERATE (*)    Bacteria, UA RARE (*)    Squamous Epithelial / LPF 0-5 (*)    All other components within normal limits  LIPASE, BLOOD  I-STAT BETA HCG BLOOD, ED ([facility], [facility], [facility] ONLY)    EKG  EKG Interpretation None       Radiology Ct Renal Stone Study  Result Date: 03/11/2017 CLINICAL DATA:  Right lower quadrant pain for 1 week EXAM: CT ABDOMEN AND PELVIS WITHOUT CONTRAST TECHNIQUE: Multidetector CT imaging of the abdomen and pelvis was performed following the standard protocol without oral or intravenous contrast material administration. COMPARISON:  April 16, 2016 FINDINGS: Lower chest: There is atelectasis in the inferior lingula. Lung base regions otherwise are clear. Hepatobiliary: Liver measures 23.2 cm in length. No focal liver lesions are apparent on this noncontrast enhanced study. Gallbladder is absent. There is no appreciable biliary duct dilatation. Pancreas: There is no pancreatic mass or inflammatory focus. Spleen: No splenic lesions are evident. Adrenals/Urinary Tract: Adrenals appear normal bilaterally. Kidneys bilaterally show no evident mass or hydronephrosis on either side. There is no renal or ureteral calculus on either side. Urinary bladder is midline with wall thickness within normal limits. Stomach/Bowel: There is no appreciable bowel wall or  mesenteric thickening. No evident bowel obstruction. No free air or portal venous air. Vascular/Lymphatic: No abdominal aortic aneurysm. No vascular lesions are evident on this noncontrast enhanced study. There is no appreciable adenopathy in the abdomen or pelvis by size criteria. There are subcentimeter lymph nodes in the right lower quadrant which do not meet size criteria for pathologic significance. Reproductive: Uterus is absent.  No pelvic mass. Other: Appendix is not evident. There is no periappendiceal region inflammatory change. No abscess or ascites is evident in the abdomen or pelvis. Musculoskeletal: There is a stimulator in the soft tissues of the lateral proximal left thigh. There are no blastic or lytic bone lesions. No intramuscular or abdominal wall lesion evident. IMPRESSION: 1.  Prominent liver without focal liver lesion evident. 2. No bowel wall thickening or bowel obstruction. No abscess. No  periappendiceal region inflammation. 3.  No renal or ureteral calculus.  No hydronephrosis. 4.  Stimulator lateral left thigh region. 5.  Gallbladder and uterus absent. Electronically Signed   By: Lowella Grip III M.D.   On: 03/11/2017 13:34    Procedures Procedures (including critical care time)  Medications Ordered in ED Medications  ketorolac (TORADOL) 30 MG/ML injection 15 mg (15 mg Intravenous Given 03/11/17 1301)  ondansetron (ZOFRAN) injection 4 mg (4 mg Intravenous Given 03/11/17 1300)     Initial Impression / Assessment and Plan / ED Course  I have reviewed the triage vital signs and the nursing notes.  Pertinent labs & imaging results that were available during my care of the patient were reviewed by me and considered in my medical decision making (see chart for details).  36 year old female presents with abdominal pain. BP is low normal but otherwise vitals are unremarkable. Labs overall are unremarkable as well. UA shows small hgb, moderate leukocytes, 6-30 WBC. She denies  urinary symptoms. Toradol and Zofran ordered.  CT obtained shows no acute abnormality. When I went to discuss results with patient she had eloped telling the nurses she had to go home to take care of an emergency. I do have some concern for drug seeking behavior with this patient.    Final Clinical Impressions(s) / ED Diagnoses   Final diagnoses:  Generalized abdominal pain    New Prescriptions New Prescriptions   No medications on file     Iris Pert 03/11/17 [place], Nathan, MD 03/11/17 1544

## 2017-03-11 NOTE — ED Notes (Signed)
Pt approached nurse first stating "im going outside to meet my friend for my phone I will be right back"

## 2017-03-12 ENCOUNTER — Encounter (HOSPITAL_COMMUNITY): Payer: Self-pay | Admitting: *Deleted

## 2017-03-12 ENCOUNTER — Emergency Department (HOSPITAL_COMMUNITY)
Admission: EM | Admit: 2017-03-12 | Discharge: 2017-03-12 | Discharge status: Against Medical Advice | Payer: Medicaid Other | Attending: Emergency Medicine | Admitting: Emergency Medicine

## 2017-03-12 DIAGNOSIS — R109 Unspecified abdominal pain: Secondary | ICD-10-CM | POA: Insufficient documentation

## 2017-03-12 DIAGNOSIS — R1084 Generalized abdominal pain: Secondary | ICD-10-CM

## 2017-03-12 HISTORY — DX: Unspecified abdominal pain: R10.9

## 2017-03-12 HISTORY — DX: Other chronic pain: G89.29

## 2017-03-12 LAB — COMPREHENSIVE METABOLIC PANEL
ALT: 8 U/L — ABNORMAL LOW (ref 14–54)
ANION GAP: 7 (ref 5–15)
AST: 16 U/L (ref 15–41)
Albumin: 3.1 g/dL — ABNORMAL LOW (ref 3.5–5.0)
Alkaline Phosphatase: 57 U/L (ref 38–126)
BUN: 8 mg/dL (ref 6–20)
CALCIUM: 8.3 mg/dL — AB (ref 8.9–10.3)
CHLORIDE: 106 mmol/L (ref 101–111)
CO2: 25 mmol/L (ref 22–32)
Creatinine, Ser: 0.71 mg/dL (ref 0.44–1.00)
GFR calc non Af Amer: >60 mL/min (ref >60)
Glucose, Bld: 87 mg/dL (ref 65–99)
Potassium: 4.1 mmol/L (ref 3.5–5.1)
Sodium: 138 mmol/L (ref 135–145)
Total Bilirubin: 0.2 mg/dL — ABNORMAL LOW (ref 0.3–1.2)
Total Protein: 5.9 g/dL — ABNORMAL LOW (ref 6.5–8.1)

## 2017-03-12 LAB — CBC
HCT: 37 % (ref 36.0–46.0)
HEMOGLOBIN: 11.9 g/dL — AB (ref 12.0–15.0)
MCH: 29.8 pg (ref 26.0–34.0)
MCHC: 32.2 g/dL (ref 30.0–36.0)
MCV: 92.5 fL (ref 78.0–100.0)
Platelets: 274 10*3/uL (ref 150–400)
RBC: 4 MIL/uL (ref 3.87–5.11)
RDW: 14 % (ref 11.5–15.5)
WBC: 7.5 10*3/uL (ref 4.0–10.5)

## 2017-03-12 MED ORDER — KETOROLAC TROMETHAMINE 30 MG/ML IJ SOLN
30.0000 mg | Freq: Once | INTRAMUSCULAR | Status: DC
  Filled 2017-03-12: qty 1

## 2017-03-12 MED ORDER — METOCLOPRAMIDE HCL 5 MG/ML IJ SOLN
10.0000 mg | Freq: Once | INTRAMUSCULAR | Status: DC
  Filled 2017-03-12: qty 2

## 2017-03-12 MED ORDER — SODIUM CHLORIDE 0.9 % IV BOLUS (SEPSIS): 1000.0000 mL | Freq: Once | INTRAVENOUS | Status: DC

## 2017-03-12 NOTE — ED Triage Notes (Signed)
The pt is c/o abd pain for 4-5 days with nausea vomiting and diarrhea

## 2017-03-12 NOTE — ED Provider Notes (Signed)
[facility] [facility] DEPT Provider Note   CSN: [id number] Arrival date & time: 03/12/17  1916     History   Chief Complaint Chief Complaint  Patient presents with  . Abdominal Pain    HPI DEBORAH LAZCANO is a 36 y.o. female.  HPI  36 y.o. Female h.o abdominal ain, anxiety, comlaining of right lower quadrant pain.  Patient seen yesterday for same and ct without acute abnormalities.  Labs unremarkable.  Pain is similar to prior sympotms and present for 4 days.  She initially points to the periumbilical area and rlq.  Complains of n/v, s/p gb and hysterectomy but states she still hhas ovaries.   Past Medical History:  Diagnosis Date  . Anxiety   . Bipolar 1 disorder ([place])   . Cancer ([place])    cervical  . Drug-seeking behavior   . Ruptured lumbar disc     Patient Active Problem List   Diagnosis Date Noted  . Cystitis, chronic 09/21/2016    Past Surgical History:  Procedure Laterality Date  . ABDOMINAL HYSTERECTOMY    . CHOLECYSTECTOMY    . TONSILLECTOMY      OB History    Gravida Para Term Preterm AB Living   4 4 4  0 0 0   SAB TAB Ectopic Multiple Live Births   0 0 0 0 4       Home Medications    Prior to Admission medications   Medication Sig Start Date End Date Taking? Authorizing Provider  ibuprofen (ADVIL,MOTRIN) 200 MG tablet Take 1,200 mg by mouth every 6 (six) hours as needed for mild pain or moderate pain.     [provider]  nitrofurantoin, macrocrystal-monohydrate, (MACROBID) 100 MG capsule Take 1 capsule (100 mg total) by mouth 2 (two) times daily. After completing twice a day dosing take 1 tablet qhs x 21 days 09/21/16   Chancy Milroy, MD  zolpidem (AMBIEN) 10 MG tablet Take 1 tablet by mouth at bedtime as needed. 08/06/16   [provider]    Family History No family history on file.  Social History Social History  Substance Use Topics  . Smoking status: Current Every Day Smoker    Packs/day: 0.50    Types: Cigarettes  .  Smokeless tobacco: Never Used  . Alcohol use No     Allergies   Avocado   Review of Systems Review of Systems  All other systems reviewed and are negative.    Physical Exam Updated Vital Signs BP (!) 98/52 (BP Location: Left Arm)   Pulse 60   Temp 98.2 F (36.8 C)   Resp 20   Ht 1.626 m (5\' 4" )   Wt 67.1 kg (148 lb)   SpO2 97%   BMI 25.40 kg/m   Physical Exam  Constitutional: She is oriented to person, place, and time. She appears well-developed and well-nourished.  HENT:  Head: Normocephalic and atraumatic.  Right Ear: External ear normal.  Left Ear: External ear normal.  Eyes: Pupils are equal, round, and reactive to light.  Neck: Normal range of motion.  Cardiovascular: Normal rate and regular rhythm.   Pulmonary/Chest: Effort normal and breath sounds normal.  Abdominal: Soft. Bowel sounds are normal. She exhibits no distension and no mass. There is no tenderness. There is no rebound and no guarding.  Musculoskeletal: Normal range of motion.  Neurological: She is alert and oriented to person, place, and time.  Skin: Skin is warm. Capillary refill takes less than 2 seconds.  Psychiatric: She  has a normal mood and affect.  Nursing note and vitals reviewed.    ED Treatments / Results  Labs (all labs ordered are listed, but only abnormal results are displayed) Labs Reviewed  COMPREHENSIVE METABOLIC PANEL - Abnormal; Notable for the following:       Result Value   Calcium 8.3 (*)    Total Protein 5.9 (*)    Albumin 3.1 (*)    ALT 8 (*)    Total Bilirubin 0.2 (*)    All other components within normal limits  CBC - Abnormal; Notable for the following:    Hemoglobin 11.9 (*)    All other components within normal limits    EKG  EKG Interpretation None       Radiology Ct Renal Stone Study  Result Date: 03/11/2017 CLINICAL DATA:  Right lower quadrant pain for 1 week EXAM: CT ABDOMEN AND PELVIS WITHOUT CONTRAST TECHNIQUE: Multidetector CT imaging of  the abdomen and pelvis was performed following the standard protocol without oral or intravenous contrast material administration. COMPARISON:  April 16, 2016 FINDINGS: Lower chest: There is atelectasis in the inferior lingula. Lung base regions otherwise are clear. Hepatobiliary: Liver measures 23.2 cm in length. No focal liver lesions are apparent on this noncontrast enhanced study. Gallbladder is absent. There is no appreciable biliary duct dilatation. Pancreas: There is no pancreatic mass or inflammatory focus. Spleen: No splenic lesions are evident. Adrenals/Urinary Tract: Adrenals appear normal bilaterally. Kidneys bilaterally show no evident mass or hydronephrosis on either side. There is no renal or ureteral calculus on either side. Urinary bladder is midline with wall thickness within normal limits. Stomach/Bowel: There is no appreciable bowel wall or mesenteric thickening. No evident bowel obstruction. No free air or portal venous air. Vascular/Lymphatic: No abdominal aortic aneurysm. No vascular lesions are evident on this noncontrast enhanced study. There is no appreciable adenopathy in the abdomen or pelvis by size criteria. There are subcentimeter lymph nodes in the right lower quadrant which do not meet size criteria for pathologic significance. Reproductive: Uterus is absent.  No pelvic mass. Other: Appendix is not evident. There is no periappendiceal region inflammatory change. No abscess or ascites is evident in the abdomen or pelvis. Musculoskeletal: There is a stimulator in the soft tissues of the lateral proximal left thigh. There are no blastic or lytic bone lesions. No intramuscular or abdominal wall lesion evident. IMPRESSION: 1.  Prominent liver without focal liver lesion evident. 2. No bowel wall thickening or bowel obstruction. No abscess. No periappendiceal region inflammation. 3.  No renal or ureteral calculus.  No hydronephrosis. 4.  Stimulator lateral left thigh region. 5.   Gallbladder and uterus absent. Electronically Signed   By: Lowella Grip III M.D.   On: 03/11/2017 13:34    Procedures Procedures (including critical care time)  Medications Ordered in ED Medications - No data to display   Initial Impression / Assessment and Plan / ED Course  I have reviewed the triage vital signs and the nursing notes.  Pertinent labs & imaging results that were available during my care of the patient were reviewed by me and considered in my medical decision making (see chart for details).    Planned iv nausea medications and fluids.  Informed by rn that patient had decided she was leaving [facility]- unable to speak with patient prior to departure.   Final Clinical Impressions(s) / ED Diagnoses   Final diagnoses:  Generalized abdominal pain    New Prescriptions New Prescriptions   No medications  on file     Pattricia Boss, MD 03/14/17 1158

## 2017-03-12 NOTE — ED Triage Notes (Signed)
The pt is very drowsy  Hardly able to keep her eyes open in triage  She had a c-t last pm and reports that she had to leave before she had the results of her c-tg scan

## 2017-03-12 NOTE — ED Notes (Signed)
Pt refusing IV and medications b/c she states it will not work. She just wants to go home

## 2017-03-15 DIAGNOSIS — N3000 Acute cystitis without hematuria: Secondary | ICD-10-CM | POA: Insufficient documentation

## 2017-03-15 DIAGNOSIS — F17210 Nicotine dependence, cigarettes, uncomplicated: Secondary | ICD-10-CM | POA: Insufficient documentation

## 2017-03-15 DIAGNOSIS — R1030 Lower abdominal pain, unspecified: Secondary | ICD-10-CM | POA: Diagnosis present

## 2017-03-15 DIAGNOSIS — Z8541 Personal history of malignant neoplasm of cervix uteri: Secondary | ICD-10-CM | POA: Insufficient documentation

## 2017-03-15 DIAGNOSIS — Z79899 Other long term (current) drug therapy: Secondary | ICD-10-CM | POA: Insufficient documentation

## 2017-03-16 ENCOUNTER — Encounter (HOSPITAL_COMMUNITY): Payer: Self-pay | Admitting: Emergency Medicine

## 2017-03-16 ENCOUNTER — Emergency Department (HOSPITAL_COMMUNITY)
Admission: EM | Admit: 2017-03-16 | Discharge: 2017-03-16 | Disposition: A | Discharge status: Home / Self Care | Payer: Medicaid Other | Attending: Emergency Medicine | Admitting: Emergency Medicine

## 2017-03-16 DIAGNOSIS — N3000 Acute cystitis without hematuria: Secondary | ICD-10-CM

## 2017-03-16 DIAGNOSIS — R1030 Lower abdominal pain, unspecified: Secondary | ICD-10-CM

## 2017-03-16 LAB — CBC
HEMATOCRIT: 38.7 % (ref 36.0–46.0)
Hemoglobin: 13 g/dL (ref 12.0–15.0)
MCH: 30.7 pg (ref 26.0–34.0)
MCHC: 33.6 g/dL (ref 30.0–36.0)
MCV: 91.5 fL (ref 78.0–100.0)
Platelets: 341 10*3/uL (ref 150–400)
RBC: 4.23 MIL/uL (ref 3.87–5.11)
RDW: 13.5 % (ref 11.5–15.5)
WBC: 7.8 10*3/uL (ref 4.0–10.5)

## 2017-03-16 LAB — COMPREHENSIVE METABOLIC PANEL
ALT: 8 U/L — ABNORMAL LOW (ref 14–54)
ANION GAP: 9 (ref 5–15)
AST: 17 U/L (ref 15–41)
Albumin: 3.5 g/dL (ref 3.5–5.0)
Alkaline Phosphatase: 66 U/L (ref 38–126)
BILIRUBIN TOTAL: 0.3 mg/dL (ref 0.3–1.2)
BUN: 8 mg/dL (ref 6–20)
CHLORIDE: 106 mmol/L (ref 101–111)
CO2: 25 mmol/L (ref 22–32)
Calcium: 8.4 mg/dL — ABNORMAL LOW (ref 8.9–10.3)
Creatinine, Ser: 0.67 mg/dL (ref 0.44–1.00)
Glucose, Bld: 91 mg/dL (ref 65–99)
POTASSIUM: 3.7 mmol/L (ref 3.5–5.1)
Sodium: 140 mmol/L (ref 135–145)
TOTAL PROTEIN: 6.8 g/dL (ref 6.5–8.1)

## 2017-03-16 LAB — URINALYSIS, MICROSCOPIC (REFLEX)

## 2017-03-16 LAB — URINALYSIS, ROUTINE W REFLEX MICROSCOPIC
Bilirubin Urine: NEGATIVE
Glucose, UA: NEGATIVE mg/dL
Hgb urine dipstick: NEGATIVE
KETONES UR: NEGATIVE mg/dL
NITRITE: POSITIVE — AB
PH: 7 (ref 5.0–8.0)
PROTEIN: NEGATIVE mg/dL
Specific Gravity, Urine: 1.01 (ref 1.005–1.030)

## 2017-03-16 LAB — LIPASE, BLOOD: LIPASE: 16 U/L (ref 11–51)

## 2017-03-16 MED ORDER — PHENAZOPYRIDINE HCL 100 MG PO TABS: 100.0000 mg | ORAL_TABLET | Freq: Three times a day (TID) | ORAL | 0 refills | Status: DC | PRN

## 2017-03-16 MED ORDER — KETOROLAC TROMETHAMINE 30 MG/ML IJ SOLN
30.0000 mg | Freq: Once | INTRAMUSCULAR | Status: AC
  Administered 2017-03-16: 30 mg via INTRAMUSCULAR
  Filled 2017-03-16: qty 1

## 2017-03-16 MED ORDER — NAPROXEN 500 MG PO TABS: 500.0000 mg | ORAL_TABLET | Freq: Two times a day (BID) | ORAL | 0 refills | Status: DC

## 2017-03-16 MED ORDER — CEPHALEXIN 500 MG PO CAPS: 500.0000 mg | ORAL_CAPSULE | Freq: Three times a day (TID) | ORAL | 0 refills | Status: DC

## 2017-03-16 MED ORDER — PHENAZOPYRIDINE HCL 100 MG PO TABS
100.0000 mg | ORAL_TABLET | Freq: Three times a day (TID) | ORAL | Status: DC
  Administered 2017-03-16: 100 mg via ORAL
  Filled 2017-03-16: qty 1

## 2017-03-16 NOTE — ED Triage Notes (Addendum)
Patient is complaining of right abdominal pain. Patient states she has kidney stones but CT stone study on 03/11/2017 does not show kidney stones. Patient states she got ketamine at [facility]. Patient states she is hurting and wants something for pain. Patient is a little sleepy. Patient states she is peeing blood.

## 2017-03-16 NOTE — ED Notes (Signed)
Pt sts she is leaving 

## 2017-03-16 NOTE — ED Provider Notes (Signed)
[facility] DEPT Provider Note   CSN: [id number] Arrival date & time: 03/15/17  2301     History   Chief Complaint Chief Complaint  Patient presents with  . Abdominal Pain    HPI Sonya King is a 36 y.o. female.  HPI  This is a 36 year old female with history of bipolar disorder, chronic abdominal pain, drug-seeking behavior who presents with abdominal pain. Patient reports a two-week history of worsening abdominal pain. She reports that it starts around her bellybutton and radiates downward and toward her back. She denies any fevers. She reports nausea without vomiting or diarrhea. She reports dysuria. No hematuria. She was seen on October 5 and October 6 for similar symptoms. She does CT scan that time that was largely unremarkable.  Past Medical History:  Diagnosis Date  . Anxiety   . Bipolar 1 disorder ([place])   . Cancer ([facility])    cervical  . Chronic abdominal pain   . Drug-seeking behavior   . Ruptured lumbar disc     Patient Active Problem List   Diagnosis Date Noted  . Cystitis, chronic 09/21/2016    Past Surgical History:  Procedure Laterality Date  . ABDOMINAL HYSTERECTOMY    . CHOLECYSTECTOMY    . TONSILLECTOMY      OB History    Gravida Para Term Preterm AB Living   4 4 4  0 0 0   SAB TAB Ectopic Multiple Live Births   0 0 0 0 4       Home Medications    Prior to Admission medications   Medication Sig Start Date End Date Taking? Authorizing Provider  ALPRAZolam Duanne Moron) 1 MG tablet Take 1 mg by mouth 3 (three) times daily as needed for anxiety.   Yes [provider]  FLUoxetine (PROZAC) 20 MG capsule Take 20 mg by mouth daily. 03/11/17  Yes [provider]  ibuprofen (ADVIL,MOTRIN) 200 MG tablet Take 1,200 mg by mouth every 6 (six) hours as needed for mild pain or moderate pain.    Yes [provider]  Melatonin 5 MG TABS Take 1 tablet by mouth at bedtime as needed (sleep).   Yes [provider]    cephALEXin (KEFLEX) 500 MG capsule Take 1 capsule (500 mg total) by mouth 3 (three) times daily. 03/16/17   Horton, Barbette Hair, MD  naproxen (NAPROSYN) 500 MG tablet Take 1 tablet (500 mg total) by mouth 2 (two) times daily. 03/16/17   Horton, Barbette Hair, MD  phenazopyridine (PYRIDIUM) 100 MG tablet Take 1 tablet (100 mg total) by mouth 3 (three) times daily as needed for pain. 03/16/17   Horton, Barbette Hair, MD    Family History History reviewed. No pertinent family history.  Social History Social History  Substance Use Topics  . Smoking status: Current Every Day Smoker    Packs/day: 0.50    Types: Cigarettes  . Smokeless tobacco: Never Used  . Alcohol use No     Allergies   Avocado   Review of Systems Review of Systems  Constitutional: Negative for fever.  Respiratory: Negative for shortness of breath.   Cardiovascular: Negative for chest pain.  Gastrointestinal: Positive for abdominal pain and nausea. Negative for diarrhea and vomiting.  Genitourinary: Positive for dysuria. Negative for hematuria.  Musculoskeletal: Positive for back pain.  All other systems reviewed and are negative.    Physical Exam Updated Vital Signs BP 104/72 (BP Location: Left Arm)   Pulse 67   Temp 97.9 F (36.6 C) (Oral)  Resp 14   Ht 5' (1.524 m)   Wt 67.1 kg (148 lb)   SpO2 97%   BMI 28.90 kg/m   Physical Exam  Constitutional: She is oriented to person, place, and time. She appears well-developed and well-nourished. No distress.  HENT:  Head: Normocephalic and atraumatic.  Cardiovascular: Normal rate, regular rhythm and normal heart sounds.   Pulmonary/Chest: Effort normal and breath sounds normal. No respiratory distress. She has no wheezes.  Abdominal: Soft. Bowel sounds are normal. She exhibits no mass. There is tenderness.  Suprapubic tenderness to palpation  Neurological: She is alert and oriented to person, place, and time.  Skin: Skin is warm and dry.  Psychiatric: She  has a normal mood and affect.  Nursing note and vitals reviewed.    ED Treatments / Results  Labs (all labs ordered are listed, but only abnormal results are displayed) Labs Reviewed  COMPREHENSIVE METABOLIC PANEL - Abnormal; Notable for the following:       Result Value   Calcium 8.4 (*)    ALT 8 (*)    All other components within normal limits  URINALYSIS, ROUTINE W REFLEX MICROSCOPIC - Abnormal; Notable for the following:    Nitrite POSITIVE (*)    Leukocytes, UA SMALL (*)    All other components within normal limits  URINALYSIS, MICROSCOPIC (REFLEX) - Abnormal; Notable for the following:    Bacteria, UA MANY (*)    Squamous Epithelial / LPF 0-5 (*)    All other components within normal limits  URINE CULTURE  LIPASE, BLOOD  CBC    EKG  EKG Interpretation None       Radiology No results found.  Procedures Procedures (including critical care time)  Medications Ordered in ED Medications  phenazopyridine (PYRIDIUM) tablet 100 mg (100 mg Oral Given 03/16/17 0412)  ketorolac (TORADOL) 30 MG/ML injection 30 mg (30 mg Intramuscular Given 03/16/17 0412)     Initial Impression / Assessment and Plan / ED Course  I have reviewed the triage vital signs and the nursing notes.  Pertinent labs & imaging results that were available during my care of the patient were reviewed by me and considered in my medical decision making (see chart for details).     Patient presents with lower abdominal pain. Ongoing and worsening of the last 3 weeks. Had 2 prior evaluations was largely reassuring workup. She is nontoxic-appearing. Afebrile. Vital signs reassuring. Suprapubic tenderness without rebound or guarding. Lab workup is only notable for nitrite-positive urine with 0-5 white cells and many bacteria. Given location of pain, patient could have acute cystitis. Doubt pyelonephritis given absence of fevers. Urine culture was sent. Patient was given Pyridium and Toradol. Will discharge  home with antibiotics.  After history, exam, and medical workup I feel the patient has been appropriately medically screened and is safe for discharge home. Pertinent diagnoses were discussed with the patient. Patient was given return precautions.   Final Clinical Impressions(s) / ED Diagnoses   Final diagnoses:  Lower abdominal pain  Acute cystitis without hematuria    New Prescriptions New Prescriptions   CEPHALEXIN (KEFLEX) 500 MG CAPSULE    Take 1 capsule (500 mg total) by mouth 3 (three) times daily.   NAPROXEN (NAPROSYN) 500 MG TABLET    Take 1 tablet (500 mg total) by mouth 2 (two) times daily.   PHENAZOPYRIDINE (PYRIDIUM) 100 MG TABLET    Take 1 tablet (100 mg total) by mouth 3 (three) times daily as needed for pain.  Merryl Hacker, MD 03/16/17 [phone]

## 2017-03-18 LAB — URINE CULTURE

## 2017-03-19 ENCOUNTER — Telehealth: Payer: Self-pay

## 2017-03-19 NOTE — Telephone Encounter (Signed)
Post ED Visit - Positive Culture Follow-up  Culture report reviewed by antimicrobial stewardship pharmacist:  []  Elenor Quinones, Pharm.D. []  Heide Guile, Pharm.D., BCPS AQ-ID []  Parks Neptune, Pharm.D., BCPS []  Alycia Rossetti, Pharm.D., BCPS []  [place], Pharm.D., BCPS, AAHIVP []  Legrand Como, Pharm.D., BCPS, AAHIVP [x]  Salome Arnt, PharmD, BCPS []  Dimitri Ped, PharmD, BCPS []  Vincenza Hews, PharmD, BCPS  Positive urine culture Treated with Cephalexin, organism sensitive to the same and no further patient follow-up is required at this time.  Genia Del 03/19/2017, 10:18 AM

## 2017-04-09 ENCOUNTER — Emergency Department (HOSPITAL_COMMUNITY)
Admission: EM | Admit: 2017-04-09 | Discharge: 2017-04-09 | Disposition: A | Discharge status: Home / Self Care | Payer: Medicaid Other | Attending: Emergency Medicine | Admitting: Emergency Medicine

## 2017-04-09 ENCOUNTER — Encounter (HOSPITAL_COMMUNITY): Payer: Self-pay | Admitting: Emergency Medicine

## 2017-04-09 DIAGNOSIS — Z79899 Other long term (current) drug therapy: Secondary | ICD-10-CM | POA: Diagnosis not present

## 2017-04-09 DIAGNOSIS — N3000 Acute cystitis without hematuria: Secondary | ICD-10-CM | POA: Diagnosis not present

## 2017-04-09 DIAGNOSIS — G8929 Other chronic pain: Secondary | ICD-10-CM | POA: Diagnosis not present

## 2017-04-09 DIAGNOSIS — R1033 Periumbilical pain: Secondary | ICD-10-CM | POA: Diagnosis present

## 2017-04-09 DIAGNOSIS — R109 Unspecified abdominal pain: Secondary | ICD-10-CM

## 2017-04-09 DIAGNOSIS — F17210 Nicotine dependence, cigarettes, uncomplicated: Secondary | ICD-10-CM | POA: Insufficient documentation

## 2017-04-09 DIAGNOSIS — Z8541 Personal history of malignant neoplasm of cervix uteri: Secondary | ICD-10-CM | POA: Insufficient documentation

## 2017-04-09 LAB — CBC
HEMATOCRIT: 43.9 % (ref 36.0–46.0)
HEMOGLOBIN: 14.3 g/dL (ref 12.0–15.0)
MCH: 30.1 pg (ref 26.0–34.0)
MCHC: 32.6 g/dL (ref 30.0–36.0)
MCV: 92.4 fL (ref 78.0–100.0)
Platelets: 261 10*3/uL (ref 150–400)
RBC: 4.75 MIL/uL (ref 3.87–5.11)
RDW: 14.2 % (ref 11.5–15.5)
WBC: 9.9 10*3/uL (ref 4.0–10.5)

## 2017-04-09 LAB — URINALYSIS, ROUTINE W REFLEX MICROSCOPIC
Bilirubin Urine: NEGATIVE
GLUCOSE, UA: NEGATIVE mg/dL
Ketones, ur: NEGATIVE mg/dL
Nitrite: NEGATIVE
PH: 5 (ref 5.0–8.0)
Protein, ur: NEGATIVE mg/dL
Specific Gravity, Urine: 1.018 (ref 1.005–1.030)

## 2017-04-09 LAB — LIPASE, BLOOD: Lipase: 20 U/L (ref 11–51)

## 2017-04-09 LAB — COMPREHENSIVE METABOLIC PANEL
ALBUMIN: 4.2 g/dL (ref 3.5–5.0)
ALT: 7 U/L — ABNORMAL LOW (ref 14–54)
ANION GAP: 7 (ref 5–15)
AST: 14 U/L — AB (ref 15–41)
Alkaline Phosphatase: 68 U/L (ref 38–126)
BUN: 10 mg/dL (ref 6–20)
CHLORIDE: 107 mmol/L (ref 101–111)
CO2: 26 mmol/L (ref 22–32)
Calcium: 9.2 mg/dL (ref 8.9–10.3)
Creatinine, Ser: 0.71 mg/dL (ref 0.44–1.00)
GFR calc Af Amer: >60 mL/min (ref >60)
GFR calc non Af Amer: >60 mL/min (ref >60)
GLUCOSE: 89 mg/dL (ref 65–99)
POTASSIUM: 3.6 mmol/L (ref 3.5–5.1)
SODIUM: 140 mmol/L (ref 135–145)
TOTAL PROTEIN: 7.5 g/dL (ref 6.5–8.1)
Total Bilirubin: 0.2 mg/dL — ABNORMAL LOW (ref 0.3–1.2)

## 2017-04-09 MED ORDER — KETOROLAC TROMETHAMINE 30 MG/ML IJ SOLN
30.0000 mg | Freq: Once | INTRAMUSCULAR | Status: AC
Start: 2017-04-09 — End: 2017-04-09
  Administered 2017-04-09: 30 mg via INTRAVENOUS
  Filled 2017-04-09: qty 1

## 2017-04-09 MED ORDER — ONDANSETRON HCL 4 MG/2ML IJ SOLN
4.0000 mg | Freq: Once | INTRAMUSCULAR | Status: AC
  Administered 2017-04-09: 4 mg via INTRAVENOUS
  Filled 2017-04-09: qty 2

## 2017-04-09 MED ORDER — NITROFURANTOIN MONOHYD MACRO 100 MG PO CAPS: 100.0000 mg | ORAL_CAPSULE | Freq: Two times a day (BID) | ORAL | 0 refills | Status: DC

## 2017-04-09 MED ORDER — ONDANSETRON HCL 4 MG PO TABS: 4.0000 mg | ORAL_TABLET | Freq: Four times a day (QID) | ORAL | 0 refills | Status: DC | PRN

## 2017-04-09 NOTE — ED Notes (Addendum)
During this visit, pt was found attempting to hide and take items from the department. Pt stated she needed cleansing wipes for for an operation to get her appendix out 3 weeks prior. Pt states that she has cancer at this time. Pt states she was supposed to get surgery for her cancer but "it was raining" so it didn't. PA [place] has checked to see that she is not being treated for cancer and there has been no evidence noted of any recent surgeries.   Before heading out for discharge, pt was found with a bag of linen and other hospital equipment in patient belonging bags in an attempt to leave with them.

## 2017-04-09 NOTE — Discharge Instructions (Signed)
Please read instructions below. Take your antibiotic, macrobid/nitrofurantoin, 2 times per day until it is gone. You can take zofran as needed for nausea. You can take naproxen as needed for pain. Drink plenty of water. Schedule an appointment with the women's clinic to follow up on your concerns about your ovaries. Return to the ER if you develop a fever or new or concerning symptoms.

## 2017-04-09 NOTE — ED Notes (Signed)
Pt verbalizes understanding of d/c paperwork, follow up instructions, and medications. Pt A/O x4, ambulatory. All belongings with patient upon departure.  

## 2017-04-09 NOTE — ED Triage Notes (Signed)
Patient here with complaints of abdominal pain. States that she was suppose to have a total hysterectomy but they left her ovaries in. Denies nausea/vomiting.

## 2017-04-09 NOTE — ED Provider Notes (Signed)
[facility] DEPT Provider Note   CSN: [id number] Arrival date & time: 04/09/17  1708     History   Chief Complaint Chief Complaint  Patient presents with  . Abdominal Pain    HPI Sonya King is a 36 y.o. female with past medical history of anxiety, bipolar 1 disorder, cervical cancer, chronic abdominal pain, drug-seeking behavior, presenting to the ED for lower abdominal pain that is constant.  She states pain has been ongoing since her last visit on 03/16/2017, where she was treated for cystitis with Keflex and Pyridium.  She states she took the Keflex as prescribed until it was gone, as well as the Pyridium, however did not provide any relief.  She reports associated nausea and vomiting, denies diarrhea or constipation.  When asked why she reports to the ER, she states "I need my ovaries taken out."  She states she has ovarian cancer and has 3 months to live. States  her doctor told her that if she can provide the money for chemotherapy that is her next step.  She states her doctor is Dr. Nancy Fetter.  Per chart review, no documented history of ovarian cancer.  Patient with history of abdominal hysterectomy.   The history is provided by the patient.    Past Medical History:  Diagnosis Date  . Anxiety   . Bipolar 1 disorder ([place])   . Cancer ([facility])    cervical  . Chronic abdominal pain   . Drug-seeking behavior   . Ruptured lumbar disc     Patient Active Problem List   Diagnosis Date Noted  . Cystitis, chronic 09/21/2016    Past Surgical History:  Procedure Laterality Date  . ABDOMINAL HYSTERECTOMY    . CHOLECYSTECTOMY    . TONSILLECTOMY      OB History    Gravida Para Term Preterm AB Living   4 4 4  0 0 0   SAB TAB Ectopic Multiple Live Births   0 0 0 0 4       Home Medications    Prior to Admission medications   Medication Sig Start Date End Date Taking? Authorizing Provider  ALPRAZolam Duanne Moron) 1 MG tablet Take 1 mg by mouth 3  (three) times daily as needed for anxiety.   Yes [provider]  buprenorphine-naloxone (SUBOXONE) 8-2 mg SUBL SL tablet Place 1 tablet under the tongue daily.   Yes [provider]  FLUoxetine (PROZAC) 20 MG capsule Take 20 mg by mouth daily. 03/11/17  Yes [provider]  ibuprofen (ADVIL,MOTRIN) 200 MG tablet Take 1,200 mg by mouth every 6 (six) hours as needed for mild pain or moderate pain.    Yes [provider]  LORazepam (ATIVAN) 1 MG tablet Take 1 mg by mouth 3 (three) times daily as needed. 03/17/17  Yes [provider]  zolpidem (AMBIEN) 10 MG tablet Take 10 mg by mouth at bedtime as needed for sleep.   Yes [provider]  cephALEXin (KEFLEX) 500 MG capsule Take 1 capsule (500 mg total) by mouth 3 (three) times daily. Patient not taking: Reported on 04/09/2017 03/16/17   Horton, Barbette Hair, MD  naproxen (NAPROSYN) 500 MG tablet Take 1 tablet (500 mg total) by mouth 2 (two) times daily. Patient not taking: Reported on 04/09/2017 03/16/17   Horton, Barbette Hair, MD  nitrofurantoin, macrocrystal-monohydrate, (MACROBID) 100 MG capsule Take 1 capsule (100 mg total) by mouth 2 (two) times daily. 04/09/17   Robinson, [place] N, PA-C  ondansetron (  ZOFRAN) 4 MG tablet Take 1 tablet (4 mg total) by mouth every 6 (six) hours as needed for nausea or vomiting. 04/09/17   Robinson, [place] N, PA-C  phenazopyridine (PYRIDIUM) 100 MG tablet Take 1 tablet (100 mg total) by mouth 3 (three) times daily as needed for pain. Patient not taking: Reported on 04/09/2017 03/16/17   Horton, Barbette Hair, MD    Family History No family history on file.  Social History Social History  Substance Use Topics  . Smoking status: Current Every Day Smoker    Packs/day: 0.50    Types: Cigarettes  . Smokeless tobacco: Never Used  . Alcohol use No     Allergies   Avocado and Benadryl [diphenhydramine hcl]   Review of Systems Review of Systems  Constitutional:  Negative for chills and fever.  Gastrointestinal: Positive for abdominal pain, nausea and vomiting. Negative for constipation and diarrhea.  Genitourinary: Negative for dysuria, frequency, vaginal bleeding and vaginal discharge.  All other systems reviewed and are negative.    Physical Exam Updated Vital Signs BP 94/62 (BP Location: Right Arm)   Pulse 73   Temp 97.8 F (36.6 C) (Oral)   Resp 18   SpO2 100%   Physical Exam  Constitutional: She is oriented to person, place, and time. She appears well-developed and well-nourished. No distress.  Pt appears as though she may be intoxicated, pupils constricted bilaterally and appears sleepy. Pt ambulating in room without difficulty. Not in distress. Resting comfortably.  HENT:  Head: Normocephalic and atraumatic.  Mouth/Throat: Oropharynx is clear and moist.  Eyes: Conjunctivae are normal.  Cardiovascular: Normal rate, regular rhythm, normal heart sounds and intact distal pulses.  Exam reveals no friction rub.   No murmur heard. Pulmonary/Chest: Effort normal and breath sounds normal. No respiratory distress. She has no wheezes. She has no rales.  Abdominal: Soft. Bowel sounds are normal. She exhibits no distension and no mass. There is tenderness (periumbilical, suprapubic). There is no rebound and no guarding.  Neurological: She is alert and oriented to person, place, and time.  Skin: Skin is warm.  Psychiatric: She has a normal mood and affect. Her behavior is normal.  Nursing note and vitals reviewed.    ED Treatments / Results  Labs (all labs ordered are listed, but only abnormal results are displayed) Labs Reviewed  COMPREHENSIVE METABOLIC PANEL - Abnormal; Notable for the following:       Result Value   AST 14 (*)    ALT 7 (*)    Total Bilirubin 0.2 (*)    All other components within normal limits  URINALYSIS, ROUTINE W REFLEX MICROSCOPIC - Abnormal; Notable for the following:    APPearance HAZY (*)    Hgb urine  dipstick SMALL (*)    Leukocytes, UA LARGE (*)    Bacteria, UA MANY (*)    Squamous Epithelial / LPF 0-5 (*)    Crystals PRESENT (*)    All other components within normal limits  URINE CULTURE  LIPASE, BLOOD  CBC    EKG  EKG Interpretation None       Radiology No results found.  Procedures Procedures (including critical care time)  Medications Ordered in ED Medications  ketorolac (TORADOL) 30 MG/ML injection 30 mg (30 mg Intravenous Given 04/09/17 1918)  ondansetron (ZOFRAN) injection 4 mg (4 mg Intravenous Given 04/09/17 1916)     Initial Impression / Assessment and Plan / ED Course  I have reviewed the triage vital signs and the nursing notes.  Pertinent labs & imaging results that were available during my care of the patient were reviewed by me and considered in my medical decision making (see chart for details).     Pt presenting to ED with abdominal pain, similar to pain described in her previous ED visits for same complaint. She is afebrile, nontoxic, well-appearing. Abdomen without peritoneal signs. Pt exhibiting some seeking behavior. Symptoms treated with toradol and zofran. CBC, CMP, lipase unremarkable. U/A with UTI. Prior culture showing E.coli. Will treat with macrobid and given pt women's clinic referral to discuss her concerns about her ovaries. No pelvic complaints.  No indication of appendicitis, bowel obstruction, bowel perforation, diverticulitis.  Patient discharged home with symptomatic treatment and given strict instructions for follow-up with their primary care physician.  Pt safe for discharge.  Patient discussed with Dr. Lita Mains, .  Discussed results, findings, treatment and follow up. Patient advised of return precautions. Patient verbalized understanding and agreed with plan.   Final Clinical Impressions(s) / ED Diagnoses   Final diagnoses:  Chronic abdominal pain  Acute cystitis without hematuria    New Prescriptions Discharge  Medication List as of 04/09/2017  8:36 PM    START taking these medications   Details  nitrofurantoin, macrocrystal-monohydrate, (MACROBID) 100 MG capsule Take 1 capsule (100 mg total) by mouth 2 (two) times daily., Starting Sat 04/09/2017, Print         Robinson, [place] N, PA-C 04/09/17 2220    Julianne Rice, MD 04/10/17 2232

## 2017-05-08 ENCOUNTER — Emergency Department (HOSPITAL_COMMUNITY): Payer: Medicaid Other

## 2017-05-08 ENCOUNTER — Encounter (HOSPITAL_COMMUNITY): Payer: Self-pay | Admitting: *Deleted

## 2017-05-08 ENCOUNTER — Emergency Department (HOSPITAL_COMMUNITY)
Admission: EM | Admit: 2017-05-08 | Discharge: 2017-05-08 | Disposition: A | Discharge status: Home / Self Care | Payer: Medicaid Other | Attending: Emergency Medicine | Admitting: Emergency Medicine

## 2017-05-08 DIAGNOSIS — E860 Dehydration: Secondary | ICD-10-CM | POA: Insufficient documentation

## 2017-05-08 DIAGNOSIS — R002 Palpitations: Secondary | ICD-10-CM | POA: Insufficient documentation

## 2017-05-08 DIAGNOSIS — Z79899 Other long term (current) drug therapy: Secondary | ICD-10-CM | POA: Insufficient documentation

## 2017-05-08 DIAGNOSIS — R51 Headache: Secondary | ICD-10-CM | POA: Insufficient documentation

## 2017-05-08 DIAGNOSIS — Z86718 Personal history of other venous thrombosis and embolism: Secondary | ICD-10-CM | POA: Diagnosis not present

## 2017-05-08 DIAGNOSIS — M542 Cervicalgia: Secondary | ICD-10-CM | POA: Diagnosis not present

## 2017-05-08 DIAGNOSIS — R55 Syncope and collapse: Secondary | ICD-10-CM | POA: Diagnosis present

## 2017-05-08 LAB — URINALYSIS, ROUTINE W REFLEX MICROSCOPIC
Bilirubin Urine: NEGATIVE
Glucose, UA: NEGATIVE mg/dL
Hgb urine dipstick: NEGATIVE
Ketones, ur: NEGATIVE mg/dL
Leukocytes, UA: NEGATIVE
Nitrite: NEGATIVE
Protein, ur: NEGATIVE mg/dL
Specific Gravity, Urine: 1.01 (ref 1.005–1.030)
pH: 9 — ABNORMAL HIGH (ref 5.0–8.0)

## 2017-05-08 LAB — CBC
HCT: 41 % (ref 36.0–46.0)
Hemoglobin: 13.6 g/dL (ref 12.0–15.0)
MCH: 30.4 pg (ref 26.0–34.0)
MCHC: 33.2 g/dL (ref 30.0–36.0)
MCV: 91.7 fL (ref 78.0–100.0)
Platelets: 235 10*3/uL (ref 150–400)
RBC: 4.47 MIL/uL (ref 3.87–5.11)
RDW: 14.2 % (ref 11.5–15.5)
WBC: 8.2 10*3/uL (ref 4.0–10.5)

## 2017-05-08 LAB — BASIC METABOLIC PANEL WITH GFR
Anion gap: 6 (ref 5–15)
BUN: 5 mg/dL — ABNORMAL LOW (ref 6–20)
CO2: 26 mmol/L (ref 22–32)
Calcium: 8.8 mg/dL — ABNORMAL LOW (ref 8.9–10.3)
Chloride: 107 mmol/L (ref 101–111)
Creatinine, Ser: 0.68 mg/dL (ref 0.44–1.00)
GFR calc Af Amer: >60 mL/min
GFR calc non Af Amer: >60 mL/min
Glucose, Bld: 82 mg/dL (ref 65–99)
Potassium: 3.7 mmol/L (ref 3.5–5.1)
Sodium: 139 mmol/L (ref 135–145)

## 2017-05-08 LAB — I-STAT BETA HCG BLOOD, ED (MC, WL, AP ONLY): I-stat hCG, quantitative: <5 m[IU]/mL

## 2017-05-08 MED ORDER — IBUPROFEN 400 MG PO TABS
400.0000 mg | ORAL_TABLET | Freq: Once | ORAL | Status: AC
  Administered 2017-05-08: 400 mg via ORAL
  Filled 2017-05-08: qty 1

## 2017-05-08 MED ORDER — SODIUM CHLORIDE 0.9 % IV BOLUS (SEPSIS)
2000.0000 mL | Freq: Once | INTRAVENOUS | Status: AC
  Administered 2017-05-08: 2000 mL via INTRAVENOUS

## 2017-05-08 MED ORDER — IOPAMIDOL (ISOVUE-370) INJECTION 76%
INTRAVENOUS | Status: AC
  Administered 2017-05-08: 100 mL via INTRAVENOUS
  Filled 2017-05-08: qty 100

## 2017-05-08 NOTE — Discharge Instructions (Signed)
Please follow with your primary care doctor in the next 2 days for a check-up. They must obtain records for further management.  ° °Do not hesitate to return to the Emergency Department for any new, worsening or concerning symptoms.  ° °

## 2017-05-08 NOTE — ED Triage Notes (Signed)
Pt reports multiple syncopal episodes over past 1-2 weeks. Episodes are becoming more frequent, including multiple this am. Reports hitting her head multiple times when passing out, has head, neck and back pain.

## 2017-05-08 NOTE — ED Provider Notes (Signed)
[facility] EMERGENCY DEPARTMENT Provider Note   CSN: [id number] Arrival date & time: 05/08/17  1200     History   Chief Complaint Chief Complaint  Patient presents with  . Loss of Consciousness    HPI   Blood pressure 103/76, pulse 91, temperature 97.6 F (36.4 C), temperature source Oral, resp. rate 16, height 5\' 4"  (1.626 m), weight 63.5 kg (140 lb), SpO2 100 %.  Sonya King is a 36 y.o. female complaining of headache, neck pain and upper thoracic back pain after multiple episodes of syncope over the last 2 weeks.  It that she feels very dizzy before the episodes of syncope.  There is normally a prodrome of chest pain.  She has never had a PE but she states that she had a clot in her leg several years ago while she was in [place].  She self DC'd her anticoagulation.  She is not on any hormonal birth control (hysterectomy).  She states that she gets intermittent leg swelling mostly at night but nothing out of the normal recently.  No recent trips or immobilizations.  She denies any melena, hematochezia, vomiting she states pain is severe and exacerbated by movement and palpation.  She is taking multiple over-the-counter pain medications with little relief.  On review of systems she endorses palpitations.  She denies family history of early cardiac death, cocaine or methamphetamine use.  Past Medical History:  Diagnosis Date  . Anxiety   . Bipolar 1 disorder ([place])   . Cancer (HCC)    cervical  . Chronic abdominal pain   . Drug-seeking behavior   . Ruptured lumbar disc     Patient Active Problem List   Diagnosis Date Noted  . Cystitis, chronic 09/21/2016    Past Surgical History:  Procedure Laterality Date  . ABDOMINAL HYSTERECTOMY    . CHOLECYSTECTOMY    . TONSILLECTOMY      OB History    Gravida Para Term Preterm AB Living   4 4 4  0 0 0   SAB TAB Ectopic Multiple Live Births   0 0 0 0 4       Home Medications    Prior to Admission  medications   Medication Sig Start Date End Date Taking? Authorizing Provider  ALPRAZolam Duanne Moron) 1 MG tablet Take 1 mg by mouth 3 (three) times daily as needed for anxiety.   Yes [provider]  FLUoxetine (PROZAC) 20 MG capsule Take 20 mg by mouth daily. 03/11/17  Yes [provider]  ibuprofen (ADVIL,MOTRIN) 200 MG tablet Take 1,200 mg by mouth every 6 (six) hours as needed for mild pain or moderate pain.    Yes [provider]  zolpidem (AMBIEN) 10 MG tablet Take 10 mg by mouth at bedtime as needed for sleep.   Yes [provider]  naproxen (NAPROSYN) 500 MG tablet Take 1 tablet (500 mg total) by mouth 2 (two) times daily. Patient not taking: Reported on 04/09/2017 03/16/17   Horton, Barbette Hair, MD  nitrofurantoin, macrocrystal-monohydrate, (MACROBID) 100 MG capsule Take 1 capsule (100 mg total) by mouth 2 (two) times daily. Patient not taking: Reported on 05/08/2017 04/09/17   Robinson, [place] N, PA-C  ondansetron (ZOFRAN) 4 MG tablet Take 1 tablet (4 mg total) by mouth every 6 (six) hours as needed for nausea or vomiting. Patient not taking: Reported on 05/08/2017 04/09/17   Robinson, [place] N, PA-C  phenazopyridine (PYRIDIUM) 100 MG tablet Take 1 tablet (100 mg total) by  mouth 3 (three) times daily as needed for pain. Patient not taking: Reported on 04/09/2017 03/16/17   Horton, Barbette Hair, MD    Family History History reviewed. No pertinent family history.  Social History Social History   Tobacco Use  . Smoking status: Current Every Day Smoker    Packs/day: 0.50    Types: Cigarettes  . Smokeless tobacco: Never Used  Substance Use Topics  . Alcohol use: No  . Drug use: No     Allergies   Avocado and Benadryl [diphenhydramine hcl]   Review of Systems Review of Systems  A complete review of systems was obtained and all systems are negative except as noted in the HPI and PMH.    Physical Exam Updated Vital Signs BP 104/79   Pulse 66    Temp 97.6 F (36.4 C) (Oral)   Resp 13   Ht 5\' 4"  (1.626 m)   Wt 63.5 kg (140 lb)   SpO2 100%   BMI 24.03 kg/m   Physical Exam  Constitutional: She is oriented to person, place, and time. She appears well-developed and well-nourished. No distress.  HENT:  Head: Normocephalic and atraumatic.  Mouth/Throat: Oropharynx is clear and moist.  No abrasions or contusions.   No hemotympanum, battle signs or raccoon's eyes  No crepitance or tenderness to palpation along the orbital rim.  EOMI intact with no pain or diplopia  No abnormal otorrhea or rhinorrhea. Nasal septum midline.  No intraoral trauma.     Eyes: Conjunctivae and EOM are normal. Pupils are equal, round, and reactive to light.  Neck: Normal range of motion. No JVD present. No tracheal deviation present.  + midline C-spine  tenderness to palpation No step-offs appreciated.  Grip strength, biceps, triceps 5/5 bilaterally;  can differentiate between pinprick and light touch bilaterally.   No anteriolateral hematomas/bruits   Cardiovascular: Normal rate, regular rhythm and intact distal pulses.  Pulmonary/Chest: Effort normal and breath sounds normal. No stridor. No respiratory distress. She has no wheezes. She has no rales. She exhibits tenderness.  Diffusely tender along the upper thoracic back with no objective signs of trauma  Abdominal: Soft. She exhibits no distension and no mass. There is no tenderness. There is no rebound and no guarding.  Musculoskeletal: Normal range of motion. She exhibits no edema or tenderness.  No calf asymmetry, superficial collaterals, palpable cords, edema, Homans sign negative bilaterally.    Neurological: She is alert and oriented to person, place, and time.  Follows commands, Clear, goal oriented speech, Strength is 5 out of 5x4 extremities, patient ambulates with a coordinated in nonantalgic gait. Sensation is grossly intact.   Skin: Skin is warm. She is not diaphoretic.    Psychiatric: She has a normal mood and affect.  Nursing note and vitals reviewed.    ED Treatments / Results  Labs (all labs ordered are listed, but only abnormal results are displayed) Labs Reviewed  BASIC METABOLIC PANEL - Abnormal; Notable for the following components:      Result Value   BUN 5 (*)    Calcium 8.8 (*)    All other components within normal limits  URINALYSIS, ROUTINE W REFLEX MICROSCOPIC - Abnormal; Notable for the following components:   Color, Urine STRAW (*)    pH 9.0 (*)    All other components within normal limits  CBC  CBG MONITORING, ED  I-STAT BETA HCG BLOOD, ED ([facility], [facility], [facility] ONLY)    EKG  EKG Interpretation  Date/Time:  Sunday May 08 2017 12:17:10 EST Ventricular Rate:  96 PR Interval:  126 QRS Duration: 88 QT Interval:  350 QTC Calculation: 442 R Axis:   82 Text Interpretation:  Normal sinus rhythm Nonspecific T wave abnormality Abnormal ECG No old tracing to compare Confirmed by Pattricia Boss [phone]) on 05/08/2017 1:28:16 PM       Radiology Ct Head Wo Contrast  Result Date: 05/08/2017 CLINICAL DATA:  Recurrent syncopal episodes for 2 weeks. Head injury with syncope. Headache and neck pain. EXAM: CT HEAD WITHOUT CONTRAST CT CERVICAL SPINE WITHOUT CONTRAST TECHNIQUE: Multidetector CT imaging of the head and cervical spine was performed following the standard protocol without intravenous contrast. Multiplanar CT image reconstructions of the cervical spine were also generated. COMPARISON:  None. FINDINGS: CT HEAD FINDINGS BRAIN: No intraparenchymal hemorrhage, mass effect nor midline shift. The ventricles and sulci are normal. No acute large vascular territory infarcts. No abnormal extra-axial fluid collections. Basal cisterns are patent. VASCULAR: Unremarkable. SKULL/SOFT TISSUES: No skull fracture. No significant soft tissue swelling. ORBITS/SINUSES: The included ocular globes and orbital contents are normal.Moderate lobulated paranasal sinus  mucosal thickening. Under pneumatized mastoid air cells bilaterally without effusion. OTHER: None. CT CERVICAL SPINE FINDINGS ALIGNMENT: Maintained lordosis. Vertebral bodies in alignment. SKULL BASE AND VERTEBRAE: Cervical vertebral bodies and posterior elements are intact. Longus coli insertional enthesopathy. Intervertebral disc heights preserved. No destructive bony lesions. C1-2 articulation maintained. SOFT TISSUES AND SPINAL CANAL: Normal. DISC LEVELS: No significant osseous canal stenosis or neural foraminal narrowing. UPPER CHEST: Lung apices are clear. OTHER: None. IMPRESSION: CT HEAD: 1. Normal noncontrast CT HEAD. 2. Moderate paranasal sinusitis. CT CERVICAL SPINE: 1. Negative noncontrast CT cervical spine. Electronically Signed   By: Elon Alas M.D.   On: 05/08/2017 15:11   Ct Angio Chest Pe W And/or Wo Contrast  Result Date: 05/08/2017 CLINICAL DATA:  Multiple syncopal episodes over the past 1-2 weeks. Ongoing shortness of breath. History of BILATERAL DVT. EXAM: CT ANGIOGRAPHY CHEST WITH CONTRAST TECHNIQUE: Multidetector CT imaging of the chest was performed using the standard protocol during bolus administration of intravenous contrast. Multiplanar CT image reconstructions and MIPs were obtained to evaluate the vascular anatomy. CONTRAST:  100 mL Isovue 370. COMPARISON:  Chest radiograph from 08/08/2005 was normal. FINDINGS: Cardiovascular: Satisfactory opacification of the pulmonary arteries to the segmental level. No evidence of pulmonary embolism. Normal heart size. No pericardial effusion. Mediastinum/Nodes: No enlarged mediastinal, hilar, or axillary lymph nodes. Thyroid gland, trachea, and esophagus demonstrate no significant findings. Lungs/Pleura: Lungs are clear. No pleural effusion or pneumothorax. Upper Abdomen: Low attenuation liver consistent with steatosis. Musculoskeletal: No chest wall abnormality. No acute or significant osseous findings. Review of the MIP images confirms  the above findings. IMPRESSION: Negative for pulmonary emboli. No acute process in the lungs is observed. Negative exam. Electronically Signed   By: Staci Righter M.D.   On: 05/08/2017 15:17   Ct Cervical Spine Wo Contrast  Result Date: 05/08/2017 CLINICAL DATA:  Recurrent syncopal episodes for 2 weeks. Head injury with syncope. Headache and neck pain. EXAM: CT HEAD WITHOUT CONTRAST CT CERVICAL SPINE WITHOUT CONTRAST TECHNIQUE: Multidetector CT imaging of the head and cervical spine was performed following the standard protocol without intravenous contrast. Multiplanar CT image reconstructions of the cervical spine were also generated. COMPARISON:  None. FINDINGS: CT HEAD FINDINGS BRAIN: No intraparenchymal hemorrhage, mass effect nor midline shift. The ventricles and sulci are normal. No acute large vascular territory infarcts. No abnormal extra-axial fluid collections. Basal cisterns are patent. VASCULAR: Unremarkable. SKULL/SOFT TISSUES: No  skull fracture. No significant soft tissue swelling. ORBITS/SINUSES: The included ocular globes and orbital contents are normal.Moderate lobulated paranasal sinus mucosal thickening. Under pneumatized mastoid air cells bilaterally without effusion. OTHER: None. CT CERVICAL SPINE FINDINGS ALIGNMENT: Maintained lordosis. Vertebral bodies in alignment. SKULL BASE AND VERTEBRAE: Cervical vertebral bodies and posterior elements are intact. Longus coli insertional enthesopathy. Intervertebral disc heights preserved. No destructive bony lesions. C1-2 articulation maintained. SOFT TISSUES AND SPINAL CANAL: Normal. DISC LEVELS: No significant osseous canal stenosis or neural foraminal narrowing. UPPER CHEST: Lung apices are clear. OTHER: None. IMPRESSION: CT HEAD: 1. Normal noncontrast CT HEAD. 2. Moderate paranasal sinusitis. CT CERVICAL SPINE: 1. Negative noncontrast CT cervical spine. Electronically Signed   By: Elon Alas M.D.   On: 05/08/2017 15:11     Procedures Procedures (including critical care time)  Medications Ordered in ED Medications  ibuprofen (ADVIL,MOTRIN) tablet 400 mg (400 mg Oral Given 05/08/17 1413)  iopamidol (ISOVUE-370) 76 % injection (100 mLs Intravenous Contrast Given 05/08/17 1437)  sodium chloride 0.9 % bolus 2,000 mL (2,000 mLs Intravenous New Bag/Given 05/08/17 1427)     Initial Impression / Assessment and Plan / ED Course  I have reviewed the triage vital signs and the nursing notes.  Pertinent labs & imaging results that were available during my care of the patient were reviewed by me and considered in my medical decision making (see chart for details).     Vitals:   05/08/17 1420 05/08/17 1500 05/08/17 1515 05/08/17 1520  BP: (!) 83/54 104/79    Pulse: 83   66  Resp: 12  18 13   Temp:      TempSrc:      SpO2: 99%   100%  Weight:      Height:        Medications  ibuprofen (ADVIL,MOTRIN) tablet 400 mg (400 mg Oral Given 05/08/17 1413)  iopamidol (ISOVUE-370) 76 % injection (100 mLs Intravenous Contrast Given 05/08/17 1437)  sodium chloride 0.9 % bolus 2,000 mL (2,000 mLs Intravenous New Bag/Given 05/08/17 1427)    Sonya King is 36 y.o. female presenting with headache and neck and upper back pain status post multiple episodes of syncope over the course of the last week.  Patient with reassuring vital signs, orthostatic however.  Patient bolused, EKG with diffuse T wave abnormalities.  Patient without risk factors for PE however given the EKG findings will obtain CT.  CT head and neck pending as well.  Patient with history of drug-seeking behavior.  CT head, cervical spine and CTA negative.  Patient aggressively hydrated in the ED, blood pressure has improved and she is ambulatory without complication.  Evaluation does not show pathology that would require ongoing emergent intervention or inpatient treatment. Pt is hemodynamically stable and mentating appropriately. Discussed findings and plan  with patient/guardian, who agrees with care plan. All questions answered. Return precautions discussed and outpatient follow up given.      Final Clinical Impressions(s) / ED Diagnoses   Final diagnoses:  Syncope, unspecified syncope type  Dehydration    ED Discharge Orders    None       Waynetta Pean 05/08/17 1527    Pattricia Boss, MD 05/08/17 [phone]

## 2017-06-12 ENCOUNTER — Other Ambulatory Visit: Payer: Self-pay

## 2017-06-12 ENCOUNTER — Ambulatory Visit (HOSPITAL_COMMUNITY)
Admission: EM | Admit: 2017-06-12 | Discharge: 2017-06-12 | Disposition: A | Discharge status: Home / Self Care | Payer: Medicaid Other | Attending: Physician Assistant | Admitting: Physician Assistant

## 2017-06-12 DIAGNOSIS — F17210 Nicotine dependence, cigarettes, uncomplicated: Secondary | ICD-10-CM | POA: Diagnosis not present

## 2017-06-12 DIAGNOSIS — Z90710 Acquired absence of both cervix and uterus: Secondary | ICD-10-CM | POA: Insufficient documentation

## 2017-06-12 DIAGNOSIS — M791 Myalgia, unspecified site: Secondary | ICD-10-CM | POA: Diagnosis not present

## 2017-06-12 DIAGNOSIS — G8929 Other chronic pain: Secondary | ICD-10-CM | POA: Diagnosis present

## 2017-06-12 DIAGNOSIS — R82998 Other abnormal findings in urine: Secondary | ICD-10-CM

## 2017-06-12 DIAGNOSIS — F319 Bipolar disorder, unspecified: Secondary | ICD-10-CM | POA: Insufficient documentation

## 2017-06-12 DIAGNOSIS — F419 Anxiety disorder, unspecified: Secondary | ICD-10-CM | POA: Insufficient documentation

## 2017-06-12 DIAGNOSIS — Z9049 Acquired absence of other specified parts of digestive tract: Secondary | ICD-10-CM | POA: Insufficient documentation

## 2017-06-12 DIAGNOSIS — R109 Unspecified abdominal pain: Secondary | ICD-10-CM | POA: Diagnosis not present

## 2017-06-12 LAB — POCT I-STAT, CHEM 8
BUN: 6 mg/dL (ref 6–20)
CALCIUM ION: 1.27 mmol/L (ref 1.15–1.40)
Chloride: 103 mmol/L (ref 101–111)
Creatinine, Ser: 0.7 mg/dL (ref 0.44–1.00)
GLUCOSE: 89 mg/dL (ref 65–99)
HCT: 42 % (ref 36.0–46.0)
HEMOGLOBIN: 14.3 g/dL (ref 12.0–15.0)
Potassium: 3.9 mmol/L (ref 3.5–5.1)
Sodium: 142 mmol/L (ref 135–145)
TCO2: 29 mmol/L (ref 22–32)

## 2017-06-12 LAB — POCT PREGNANCY, URINE: Preg Test, Ur: NEGATIVE

## 2017-06-12 MED ORDER — CEPHALEXIN 500 MG PO CAPS: 500.0000 mg | ORAL_CAPSULE | Freq: Four times a day (QID) | ORAL | 0 refills | Status: DC

## 2017-06-12 NOTE — ED Provider Notes (Signed)
06/12/2017 3:59 PM   DOB: [birth year] / MRN: [id number]  SUBJECTIVE:  Sonya King is a 37 y.o. female presenting for a lupus flare and she tells me that she hurts everywhere and states "there is not one single place I don't hurt."  Tells me that she was diagnosed at [facility] about three months ago.  She has an overdose risk score of 920 and recently filled suboxone on 1/02, but tells me that the last time this was filled was in about three weeks ago.     She is allergic to avocado and benadryl [diphenhydramine hcl].   She  has a past medical history of Anxiety, Bipolar 1 disorder ([place]), Cancer ([employer]), Chronic abdominal pain, Drug-seeking behavior, and Ruptured lumbar disc.    She  reports that she has been smoking cigarettes.  She has been smoking about 0.50 packs per day. she has never used smokeless tobacco. She reports that she does not drink alcohol or use drugs. She  reports that she currently engages in sexual activity and has had partners who are Female. She reports using the following method of birth control/protection: Surgical. The patient  has a past surgical history that includes Abdominal hysterectomy; Cholecystectomy; and Tonsillectomy.  Her family history is not on file.  Review of Systems  Constitutional: Negative for chills and fever.  Musculoskeletal: Positive for back pain, myalgias and neck pain. Negative for falls and joint pain.  Neurological: Positive for headaches. Negative for dizziness.    OBJECTIVE:  BP (!) 111/55 (BP Location: Right Arm)   Pulse 96   Temp (!) 97.5 F (36.4 C) (Oral)   SpO2 100%   Wt Readings from Last 3 Encounters:  05/08/17 140 lb (63.5 kg)  03/16/17 148 lb (67.1 kg)  03/12/17 148 lb (67.1 kg)   Temp Readings from Last 3 Encounters:  06/12/17 (!) 97.5 F (36.4 C) (Oral)  05/08/17 97.6 F (36.4 C) (Oral)  04/09/17 97.8 F (36.6 C) (Oral)   BP Readings from Last 3 Encounters:  06/12/17 (!) 111/55  05/08/17 107/69  04/09/17  94/62   Pulse Readings from Last 3 Encounters:  06/12/17 96  05/08/17 65  04/09/17 73     Physical Exam  Constitutional: She is oriented to person, place, and time. Vital signs are normal.  Non-toxic appearance. She does not have a sickly appearance. She does not appear ill. No distress.  Disheveled, malnourished   Cardiovascular: Normal rate and intact distal pulses. Exam reveals no gallop and no friction rub.  No murmur heard. Pulmonary/Chest: Effort normal and breath sounds normal. No respiratory distress. She has no wheezes. She has no rales. She exhibits no tenderness.  Abdominal: Soft. Bowel sounds are normal. She exhibits no distension and no mass. There is no tenderness. There is no rebound and no guarding.  Musculoskeletal: Normal range of motion. She exhibits no edema, tenderness or deformity.  Neurological: She is alert and oriented to person, place, and time. She displays normal reflexes. No cranial nerve deficit. She exhibits normal muscle tone. Coordination normal.  Skin: Skin is warm and dry. No rash noted. No erythema. No pallor.  Psychiatric: Thought content normal. Her speech is delayed. Her speech is not rapid and/or pressured, not tangential and not slurred. She is slowed and withdrawn. Cognition and memory are normal. She is communicative.    Results for orders placed or performed during the hospital encounter of 06/12/17 (from the past 72 hour(s))  I-STAT, chem 8     Status: None  Collection Time: 06/12/17  3:41 PM  Result Value Ref Range   Sodium 142 135 - 145 mmol/L   Potassium 3.9 3.5 - 5.1 mmol/L   Chloride 103 101 - 111 mmol/L   BUN 6 6 - 20 mg/dL   Creatinine, Ser 0.70 0.44 - 1.00 mg/dL   Glucose, Bld 89 65 - 99 mg/dL   Calcium, Ion 1.27 1.15 - 1.40 mmol/L   TCO2 29 22 - 32 mmol/L   Hemoglobin 14.3 12.0 - 15.0 g/dL   HCT 42.0 36.0 - 46.0 %  Pregnancy, urine POC     Status: None   Collection Time: 06/12/17  3:48 PM  Result Value Ref Range   Preg  Test, Ur NEGATIVE NEGATIVE    Comment:        THE SENSITIVITY OF THIS METHODOLOGY IS >24 mIU/mL     No results found.  ASSESSMENT AND PLAN:  Myalgia: Medical records from [place] are not available.  There is no mention of lupus in her chart.  Her vitals are stable.  She is at very high risk of overdose per PMP aware.    Urine leukocytes: Leuks too high to read and thus can not be placed in the system. Will treat for this etiology and culture is out.          The patient is advised to call or return to clinic if she does not see an improvement in symptoms, or to seek the care of the closest emergency department if she worsens with the above plan.   Sonya King, MHS, PA-C 06/12/2017 3:59 PM    Sonya Coop, PA-C 06/12/17 1605

## 2017-06-12 NOTE — Discharge Instructions (Addendum)
You have what looks like a urinary tract infection.  Please continue your tylenol.  Please see your primary with regard to your lupus.  If at any point you begin to have fever of greater than 100.4 please go to the emergency room as that would indicate the possibility of a kidney infection.

## 2017-06-12 NOTE — ED Triage Notes (Addendum)
Per pt she recently got dx with lupus last month, per pt her PCP doctor is not giving her the meds that she needs like her pain meds, per pt her body starts to cramp, per pt her PCP keeps telling her they will give her pain meds when she see him but he's not giving her the pain meds,

## 2017-06-14 LAB — URINE CULTURE

## 2017-11-10 ENCOUNTER — Encounter (HOSPITAL_BASED_OUTPATIENT_CLINIC_OR_DEPARTMENT_OTHER): Payer: Self-pay

## 2017-11-10 ENCOUNTER — Emergency Department (HOSPITAL_BASED_OUTPATIENT_CLINIC_OR_DEPARTMENT_OTHER)
Admission: EM | Admit: 2017-11-10 | Discharge: 2017-11-10 | Disposition: A | Discharge status: Home / Self Care | Payer: Medicaid Other | Attending: Emergency Medicine | Admitting: Emergency Medicine

## 2017-11-10 ENCOUNTER — Emergency Department (HOSPITAL_BASED_OUTPATIENT_CLINIC_OR_DEPARTMENT_OTHER): Payer: Medicaid Other

## 2017-11-10 ENCOUNTER — Other Ambulatory Visit: Payer: Self-pay

## 2017-11-10 DIAGNOSIS — F17210 Nicotine dependence, cigarettes, uncomplicated: Secondary | ICD-10-CM | POA: Diagnosis not present

## 2017-11-10 DIAGNOSIS — Z733 Stress, not elsewhere classified: Secondary | ICD-10-CM | POA: Diagnosis not present

## 2017-11-10 DIAGNOSIS — F1910 Other psychoactive substance abuse, uncomplicated: Secondary | ICD-10-CM | POA: Diagnosis not present

## 2017-11-10 DIAGNOSIS — R569 Unspecified convulsions: Secondary | ICD-10-CM | POA: Diagnosis not present

## 2017-11-10 DIAGNOSIS — F1120 Opioid dependence, uncomplicated: Secondary | ICD-10-CM | POA: Insufficient documentation

## 2017-11-10 DIAGNOSIS — R45851 Suicidal ideations: Secondary | ICD-10-CM | POA: Insufficient documentation

## 2017-11-10 DIAGNOSIS — Z79899 Other long term (current) drug therapy: Secondary | ICD-10-CM | POA: Diagnosis not present

## 2017-11-10 DIAGNOSIS — Z046 Encounter for general psychiatric examination, requested by authority: Secondary | ICD-10-CM | POA: Diagnosis not present

## 2017-11-10 DIAGNOSIS — F419 Anxiety disorder, unspecified: Secondary | ICD-10-CM | POA: Insufficient documentation

## 2017-11-10 DIAGNOSIS — Z8541 Personal history of malignant neoplasm of cervix uteri: Secondary | ICD-10-CM | POA: Diagnosis not present

## 2017-11-10 LAB — CBC
HCT: 36.2 % (ref 36.0–46.0)
Hemoglobin: 12.3 g/dL (ref 12.0–15.0)
MCH: 30.4 pg (ref 26.0–34.0)
MCHC: 34 g/dL (ref 30.0–36.0)
MCV: 89.6 fL (ref 78.0–100.0)
PLATELETS: 201 10*3/uL (ref 150–400)
RBC: 4.04 MIL/uL (ref 3.87–5.11)
RDW: 13.9 % (ref 11.5–15.5)
WBC: 7 10*3/uL (ref 4.0–10.5)

## 2017-11-10 LAB — COMPREHENSIVE METABOLIC PANEL
ALT: 10 U/L — ABNORMAL LOW (ref 14–54)
AST: 28 U/L (ref 15–41)
Albumin: 3.7 g/dL (ref 3.5–5.0)
Alkaline Phosphatase: 86 U/L (ref 38–126)
Anion gap: 3 — ABNORMAL LOW (ref 5–15)
BUN: 11 mg/dL (ref 6–20)
CHLORIDE: 106 mmol/L (ref 101–111)
CO2: 27 mmol/L (ref 22–32)
Calcium: 8.6 mg/dL — ABNORMAL LOW (ref 8.9–10.3)
Creatinine, Ser: 0.6 mg/dL (ref 0.44–1.00)
GFR calc Af Amer: >60 mL/min (ref >60)
Glucose, Bld: 101 mg/dL — ABNORMAL HIGH (ref 65–99)
POTASSIUM: 4 mmol/L (ref 3.5–5.1)
Sodium: 136 mmol/L (ref 135–145)
Total Bilirubin: 0.5 mg/dL (ref 0.3–1.2)
Total Protein: 6.5 g/dL (ref 6.5–8.1)

## 2017-11-10 LAB — PREGNANCY, URINE: Preg Test, Ur: NEGATIVE

## 2017-11-10 LAB — RAPID URINE DRUG SCREEN, HOSP PERFORMED
Amphetamines: NOT DETECTED
BENZODIAZEPINES: POSITIVE — AB
Barbiturates: NOT DETECTED
COCAINE: POSITIVE — AB
OPIATES: POSITIVE — AB
Tetrahydrocannabinol: NOT DETECTED

## 2017-11-10 LAB — ACETAMINOPHEN LEVEL

## 2017-11-10 LAB — SALICYLATE LEVEL

## 2017-11-10 LAB — TROPONIN I: Troponin I: <0.03 ng/mL (ref <0.03)

## 2017-11-10 LAB — ETHANOL

## 2017-11-10 MED ORDER — SODIUM CHLORIDE 0.9 % IV BOLUS: 1000.0000 mL | Freq: Once | INTRAVENOUS | Status: DC

## 2017-11-10 NOTE — ED Notes (Signed)
TTS consult in progress. °

## 2017-11-10 NOTE — Consult Note (Signed)
  Tele psych Assessment  Sonya King, 37 y.o., female patient presented to [facility] from her primary care after passing out and EMS called to bring to hospital.  Patient was at PCP office related to having seizures last night..  Patient seen via telepsych by TTS and  this provider; chart reviewed and consulted with Dr. Dwyane Dee on 11/10/17.  On evaluation [name] reports that she does not want to kill her self. "I have kids at home I wouldn't do nothing like that.  I have thought about it but I wouldn't kill myself."  Patient states that she has been doing cocaine, Klonopin, and Vicodin and that is why she is feeling so groggy.  At this time patient denies suicidal/self-harm/homicidal ideation, psychosis, and paranoia.   During evaluation [name] is alert/oriented x 4; calm/cooperative; and groggy.  Patient's name had to be called several time to wake up during interview.  She does not appear to be responding to internal/external stimuli or delusional thoughts.  Patient denies suicidal/self-harm/homicidal ideation, psychosis, and paranoia.  Patient asked if she was interested in rehab services and she said no.  Informed patient that she was psychiatrically cleared but would send community resources/information for substance use disorders and asked patient to repeat what I had said.  Patient was able to repeat and agreed.  Patient answered question appropriately.     For a detailed note see TTS assessment note  Recommendations:  Outpatient psychiatric resources.    Disposition: No evidence of imminent risk to self or others at present.   Patient does not meet criteria for psychiatric inpatient admission.  Shuvon B. Rankin, NP

## 2017-11-10 NOTE — ED Notes (Addendum)
Awake, states is ready to go home and asking if ride is here. Security walked with to go look in parking lot for friend, whom may  be sleeping in car

## 2017-11-10 NOTE — Discharge Instructions (Addendum)
Return to ER for new or worsening symptoms, any additional concerns.  °

## 2017-11-10 NOTE — ED Notes (Signed)
Patient is resting comfortably. 

## 2017-11-10 NOTE — ED Provider Notes (Signed)
[facility] Provider Note   CSN: [id number] Arrival date & time: 11/10/17  1346     History   Chief Complaint Chief Complaint  Patient presents with  . Seizures  . Suicidal    HPI Sonya King is a 37 y.o. female.  The history is provided by the patient and medical records. No language interpreter was used.  Seizures     Sonya King is a 37 y.o. female  with a PMH of anxiety, bipolar disorder, substance abuse who presents to the Emergency Department complaining of having three seizures yesterday. She states that a friend was with her and told her that she had a seizure. She reports feeling like her whole body was tightening up and her lips were tingling. She doesn't remember seizure itself. No bowel/bladder incontinence. She reports going to her primary care doctor today who recommended that she come to the emergency department for work-up, however she refused EMS.  A friend brought her here now.  She does report having a seizure earlier today as well.  There are no friends or family at bedside for collateral information.  She reports taking a Vicodin and for Klonopin today because of her stress.  Denies recent illness.  No fever, chills, cough, congestion, chest pain, shortness of breath, abdominal pain, nausea, vomiting or diarrhea.  Patient reports wanting help with dealing with her stress and anxiety.  She will have intermittent suicidal thoughts, however she denies any suicidal suicidal thoughts to me currently.  Denies any known plan.  Denies HI or auditory/visual hallucinations.  Past Medical History:  Diagnosis Date  . Anxiety   . Anxiety   . Bipolar 1 disorder ([place])   . Cancer (HCC)    cervical  . Chronic abdominal pain   . Drug-seeking behavior   . Ruptured lumbar disc     Patient Active Problem List   Diagnosis Date Noted  . Cystitis, chronic 09/21/2016    Past Surgical History:  Procedure Laterality Date  . ABDOMINAL  HYSTERECTOMY    . CHOLECYSTECTOMY    . TONSILLECTOMY       OB History    Gravida  4   Para  4   Term  4   Preterm  0   AB  0   Living  0     SAB  0   TAB  0   Ectopic  0   Multiple  0   Live Births  4            Home Medications    Prior to Admission medications   Medication Sig Start Date End Date Taking? Authorizing Provider  ALPRAZolam Duanne Moron) 1 MG tablet Take 1 mg by mouth 3 (three) times daily as needed for anxiety.    [provider]  clonazePAM (KLONOPIN) 0.5 MG tablet Take 0.5 tablets by mouth 2 (two) times daily. 09/08/17   [provider]  methocarbamol (ROBAXIN) 750 MG tablet Take 2 tablets by mouth 4 (four) times daily. 09/15/17   [provider]  SUBOXONE 12-3 MG FILM Take 1 Film by mouth 2 (two) times daily. 11/03/17   [provider]  SUBOXONE 8-2 MG FILM Take 1 Film by mouth 2 (two) times daily. 11/03/17   [provider]  traMADol (ULTRAM) 50 MG tablet Take 2 tablets by mouth 4 (four) times daily. 10/12/17   [provider]  traZODone (DESYREL) 100 MG tablet Take 4 tablets by mouth at bedtime. 11/02/17  [provider]  VRAYLAR capsule Take 1 capsule by mouth at bedtime. 10/17/17   [provider]    Family History History reviewed. No pertinent family history.  Social History Social History   Tobacco Use  . Smoking status: Current Every Day Smoker    Packs/day: 0.50    Types: Cigarettes  . Smokeless tobacco: Never Used  Substance Use Topics  . Alcohol use: No  . Drug use: No     Allergies   Avocado and Benadryl [diphenhydramine hcl]   Review of Systems Review of Systems  Neurological: Positive for seizures.  Psychiatric/Behavioral: Positive for suicidal ideas (Passively, none at present).  All other systems reviewed and are negative.    Physical Exam Updated Vital Signs BP 100/72 (BP Location: Left Arm)   Pulse 66   Temp 98.2 F (36.8 C) (Oral)   Resp  18   Ht 5\' 3"  (1.6 m)   Wt 59 kg (130 lb)   SpO2 100%   BMI 23.03 kg/m   Physical Exam  Constitutional: She is oriented to person, place, and time. She appears well-developed and well-nourished. No distress.  Drowsy, but arousable.  HENT:  Head: Normocephalic and atraumatic.  Cardiovascular: Normal rate, regular rhythm and normal heart sounds.  No murmur heard. Pulmonary/Chest: Effort normal and breath sounds normal. No respiratory distress.  Abdominal: Soft. She exhibits no distension. There is no tenderness.  Musculoskeletal: Normal range of motion.  Neurological: She is alert and oriented to person, place, and time.  CN 2-12 grossly intact. Strength and sensation intact.  Skin: Skin is warm and dry.  Nursing note and vitals reviewed.    ED Treatments / Results  Labs (all labs ordered are listed, but only abnormal results are displayed) Labs Reviewed  COMPREHENSIVE METABOLIC PANEL - Abnormal; Notable for the following components:      Result Value   Glucose, Bld 101 (*)    Calcium 8.6 (*)    ALT 10 (*)    Anion gap 3 (*)    All other components within normal limits  ACETAMINOPHEN LEVEL - Abnormal; Notable for the following components:   Acetaminophen (Tylenol), Serum <10 (*)    All other components within normal limits  RAPID URINE DRUG SCREEN, HOSP PERFORMED - Abnormal; Notable for the following components:   Opiates POSITIVE (*)    Cocaine POSITIVE (*)    Benzodiazepines POSITIVE (*)    All other components within normal limits  ETHANOL  SALICYLATE LEVEL  CBC  PREGNANCY, URINE  TROPONIN I    EKG EKG Interpretation  Date/Time:  Thursday November 10 2017 14:46:31 EDT Ventricular Rate:  68 PR Interval:  144 QRS Duration: 98 QT Interval:  414 QTC Calculation: 440 R Axis:   87 Text Interpretation:  Normal sinus rhythm Normal ECG Confirmed by Quintella Reichert [phone]) on 11/10/2017 2:57:56 PM   Radiology Ct Head Wo Contrast  Result Date: 11/10/2017 CLINICAL  DATA:  Three seizures last night. EXAM: CT HEAD WITHOUT CONTRAST TECHNIQUE: Contiguous axial images were obtained from the base of the skull through the vertex without intravenous contrast. COMPARISON:  May 08, 2017 FINDINGS: Brain: No evidence of acute infarction, hemorrhage, hydrocephalus, extra-axial collection or mass lesion/mass effect. Vascular: No hyperdense vessel or unexpected calcification. Skull: Normal. Negative for fracture or focal lesion. Sinuses/Orbits: No acute finding. Other: None. IMPRESSION: No cause for the patient's seizures identified. No acute abnormality. Electronically Signed   By: Dorise Bullion III M.D   On: 11/10/2017 14:57  Procedures Procedures (including critical care time)  Medications Ordered in ED Medications - No data to display   Initial Impression / Assessment and Plan / ED Course  I have reviewed the triage vital signs and the nursing notes.  Pertinent labs & imaging results that were available during my care of the patient were reviewed by me and considered in my medical decision making (see chart for details).    CHERITY BLICKENSTAFF is a 37 y.o. female who presents to ED for evaluation after reporting 3 seizures last night and one today. Witnessed by a friend, but unfortunately no friends or family at bedside for collateral information. She is drowsy, but arousable. Multiple prescriptions for opioids in her bag. Clinically appears to have taken more than prescribed dose. Given possible new seizure, CT head was obtained and negative. She has no focal neuro deficits on her exam. Labs reviewed and reassuring. EKG NSR. UDS +  For cocaine, opiates and BZ's. She was also expressing passive SI. She has been evaluated by TTS who recommend outpatient resources which were provided. Patient monitored in ED for several hours. She is much more alert, ambulatory without assistance and tolerating PO. Evaluation does not show pathology that would require ongoing emergent  intervention or inpatient treatment. Return precautions discussed. Strongly encouraged PCP follow up for further discussion of seizure-like activity. All questions answered.    Final Clinical Impressions(s) / ED Diagnoses   Final diagnoses:  Polysubstance abuse ([place])  Seizure-like activity [facility])  Suicidal ideations    ED Discharge Orders    None       Sayre Mazor, Ozella Almond, PA-C 11/10/17 2004    Quintella Reichert, MD 11/11/17 [phone]

## 2017-11-10 NOTE — BH Assessment (Signed)
Tele Assessment Note   Patient Name: Sonya King MRN: [id number] Referring Physician: Ward, PA Location of Patient: [facility] Location of Provider: [place] Department  JONNY LONGINO is an 37 y.o. female. Pt informed RN that she was suicidal. Pt is currently stating that she is not suicidal. Pt denies HI and AVH. Pt appears under the influence. Pt initially denied SA but drugs found in her system. Pt eventually admited to using Klonopin, Vicadin, and cocaine. Pt also states she previously used Suboxone. Pt can hardly keep her head up. Pt appears very drowsy. Pt states she has not received previous inpatient treatment. The Pt is not currently receiving outpatient treatment. The Pt is not currently prescribed mental health medication.   Shuvon, NP completed a tele-psych with the Pt. Shuvon, NP recommends D/C and follow-up with SA resources. SA resources faxed.   Diagnosis:  F11.20 Opioid use, severe; F41.20 Cocaine use, severe   Past Medical History:  Past Medical History:  Diagnosis Date  . Anxiety   . Anxiety   . Bipolar 1 disorder ([place])   . Cancer (HCC)    cervical  . Chronic abdominal pain   . Drug-seeking behavior   . Ruptured lumbar disc     Past Surgical History:  Procedure Laterality Date  . ABDOMINAL HYSTERECTOMY    . CHOLECYSTECTOMY    . TONSILLECTOMY      Family History: No family history on file.  Social History:  reports that she has been smoking cigarettes.  She has been smoking about 0.50 packs per day. She has never used smokeless tobacco. She reports that she does not drink alcohol or use drugs.  Additional Social History:  Alcohol / Drug Use Pain Medications: please see mar Prescriptions: please see mar Over the Counter: please see mar History of alcohol / drug use?: Yes Longest period of sobriety (when/how long): unknown Substance #1 Name of Substance 1: benzos 1 - Age of First Use: unknown 1 - Amount (size/oz): unknown 1 -  Frequency: unknown 1 - Duration: ongoing 1 - Last Use / Amount: 11/10/17 Substance #2 Name of Substance 2: opiates  2 - Age of First Use: unknown 2 - Amount (size/oz): unknown 2 - Frequency: unknown 2 - Duration: ongoing 2 - Last Use / Amount: 11/10/17 Substance #3 Name of Substance 3: cocaine 3 - Age of First Use: unknown 3 - Amount (size/oz): unknown 3 - Frequency: unknown 3 - Duration: ongoing 3 - Last Use / Amount: 11/10/17  CIWA: CIWA-Ar BP: 99/70 Pulse Rate: 100 COWS:    Allergies:  Allergies  Allergen Reactions  . Avocado Itching and Swelling    Throat swelling   . Benadryl [Diphenhydramine Hcl] Other (See Comments)    Makes her legs kick    Home Medications:  (Not in a hospital admission)  OB/GYN Status:  No LMP recorded. Patient has had a hysterectomy.  General Assessment Data Location of Assessment: [facility] TTS Assessment: In system Is this a Tele or Face-to-Face Assessment?: Tele Assessment Is this an Initial Assessment or a Re-assessment for this encounter?: Initial Assessment Marital status: Single Maiden name: NA Is patient pregnant?: No Pregnancy Status: No Living Arrangements: Other relatives Can pt return to current living arrangement?: Yes Admission Status: Voluntary Is patient capable of signing voluntary admission?: Yes Referral Source: Self/Family/Friend Insurance type: Medicaid     Crisis Care Plan Living Arrangements: Other relatives Legal Guardian: Other:(self) Name of Psychiatrist: NA Name of Therapist: NA  Education Status Is patient  currently in school?: No Is the patient employed, unemployed or receiving disability?: Unemployed  Risk to self with the past 6 months Suicidal Ideation: No Has patient been a risk to self within the past 6 months prior to admission? : No Suicidal Intent: No Has patient had any suicidal intent within the past 6 months prior to admission? : No Is patient at risk for suicide?:  No Suicidal Plan?: No Has patient had any suicidal plan within the past 6 months prior to admission? : No Access to Means: No What has been your use of drugs/alcohol within the last 12 months?: NA Previous Attempts/Gestures: Yes How many times?: 1 Other Self Harm Risks: NA Triggers for Past Attempts: None known Intentional Self Injurious Behavior: None Family Suicide History: No Recent stressful life event(s): Other (Comment)(SA) Persecutory voices/beliefs?: No Depression: No Depression Symptoms: (pt did not report) Substance abuse history and/or treatment for substance abuse?: Yes Suicide prevention information given to non-admitted patients: Not applicable  Risk to Others within the past 6 months Homicidal Ideation: No Does patient have any lifetime risk of violence toward others beyond the six months prior to admission? : No Thoughts of Harm to Others: No Current Homicidal Intent: No Current Homicidal Plan: No Access to Homicidal Means: No Identified Victim: NA History of harm to others?: No Assessment of Violence: None Noted Violent Behavior Description: NA Does patient have access to weapons?: No Criminal Charges Pending?: No Does patient have a court date: No Is patient on probation?: No  Psychosis Hallucinations: None noted Delusions: None noted  Mental Status Report Appearance/Hygiene: Unremarkable Eye Contact: Fair Motor Activity: Freedom of movement Speech: Pressured, Slurred Level of Consciousness: Other (Comment)(under the influence) Mood: Anxious Affect: Irritable Anxiety Level: Minimal Thought Processes: Relevant Judgement: Impaired Orientation: Person, Place, Situation Obsessive Compulsive Thoughts/Behaviors: None  Cognitive Functioning Concentration: Normal Memory: Recent Intact, Remote Intact Is patient IDD: No Is patient DD?: No Insight: Poor Impulse Control: Poor Appetite: Fair Have you had any weight changes? : No Change Sleep:  Decreased Total Hours of Sleep: 6 Vegetative Symptoms: None  ADLScreening [facility]) Patient's cognitive ability adequate to safely complete daily activities?: Yes Patient able to express need for assistance with ADLs?: Yes Independently performs ADLs?: Yes (appropriate for developmental age)  Prior Inpatient Therapy Prior Inpatient Therapy: No  Prior Outpatient Therapy Prior Outpatient Therapy: No Does patient have an ACCT team?: No Does patient have Intensive In-House Services?  : No Does patient have [facility] services? : No Does patient have P4CC services?: No  ADL Screening (condition at time of admission) Patient's cognitive ability adequate to safely complete daily activities?: Yes Is the patient deaf or have difficulty hearing?: No Does the patient have difficulty seeing, even when wearing glasses/contacts?: No Does the patient have difficulty concentrating, remembering, or making decisions?: No Patient able to express need for assistance with ADLs?: Yes Does the patient have difficulty dressing or bathing?: No Independently performs ADLs?: Yes (appropriate for developmental age) Does the patient have difficulty walking or climbing stairs?: No       Abuse/Neglect Assessment (Assessment to be complete while patient is alone) Abuse/Neglect Assessment Can Be Completed: Yes Physical Abuse: Denies Verbal Abuse: Denies Sexual Abuse: Denies Exploitation of patient/patient's resources: Denies     Regulatory affairs officer (For Healthcare) Does Patient Have a Medical Advance Directive?: No Would patient like information on creating a medical advance directive?: No - Patient declined    Additional Information 1:1 In Past 12 Months?: No CIRT Risk: No Elopement  Risk: No Does patient have medical clearance?: Yes     Disposition:  Disposition Initial Assessment Completed for this Encounter: Yes  This service was provided via telemedicine using a 2-way,  interactive audio and video technology.  Names of all persons participating in this telemedicine service and their role in this encounter.  Role:  Shuvon Rankin Role: NP   Role:    Role:     Forrester Blando D 11/10/2017 3:50 PM

## 2017-11-10 NOTE — ED Triage Notes (Addendum)
Pt states she had 3 seizures last night-no hx-did not seek medical attention-states she was seen by PCP today where she passed out-EMS was called-pt refused EMS transport-pt was brought to ED by her room mate per pt-no ne with her in ED WR-pt NAD-pt speaking with slow speech and closing eyes-appears to be under the influence of ETOH/drugs-pt denies use of both

## 2017-11-10 NOTE — ED Notes (Signed)
ED Provider at bedside. 

## 2017-11-10 NOTE — ED Notes (Signed)
During SI assessment pt began to cry and states she needs help with stress and anxiety-answered yes to all SI ?s-charge nurse notified of need for room 12 to be readied for SI pt

## 2017-11-10 NOTE — ED Notes (Signed)
Signed home meds out with patient.

## 2017-11-10 NOTE — ED Notes (Signed)
Per EDPA okay to hold on IV at present.

## 2017-11-10 NOTE — ED Notes (Signed)
Patient transported to X-ray via stretcher 

## 2019-02-04 ENCOUNTER — Emergency Department (HOSPITAL_COMMUNITY)
Admission: EM | Admit: 2019-02-04 | Discharge: 2019-02-04 | Disposition: A | Discharge status: Home / Self Care | Payer: Medicaid Other | Attending: Emergency Medicine | Admitting: Emergency Medicine

## 2019-02-04 ENCOUNTER — Encounter (HOSPITAL_COMMUNITY): Payer: Self-pay | Admitting: Emergency Medicine

## 2019-02-04 ENCOUNTER — Other Ambulatory Visit: Payer: Self-pay

## 2019-02-04 DIAGNOSIS — M79641 Pain in right hand: Secondary | ICD-10-CM | POA: Diagnosis present

## 2019-02-04 DIAGNOSIS — Z79899 Other long term (current) drug therapy: Secondary | ICD-10-CM | POA: Diagnosis not present

## 2019-02-04 DIAGNOSIS — M79642 Pain in left hand: Secondary | ICD-10-CM | POA: Diagnosis not present

## 2019-02-04 DIAGNOSIS — F17210 Nicotine dependence, cigarettes, uncomplicated: Secondary | ICD-10-CM | POA: Diagnosis not present

## 2019-02-04 MED ORDER — PREDNISONE 20 MG PO TABS: 40.0000 mg | ORAL_TABLET | Freq: Every day | ORAL | 0 refills | Status: AC

## 2019-02-04 MED ORDER — HYDROCODONE-ACETAMINOPHEN 5-325 MG PO TABS
1.0000 | ORAL_TABLET | Freq: Once | ORAL | Status: AC
  Administered 2019-02-04: 21:00:00 1 via ORAL
  Filled 2019-02-04: qty 1

## 2019-02-04 MED ORDER — PREDNISONE 20 MG PO TABS
60.0000 mg | ORAL_TABLET | Freq: Once | ORAL | Status: AC
  Administered 2019-02-04: 21:00:00 60 mg via ORAL
  Filled 2019-02-04: qty 3

## 2019-02-04 NOTE — ED Notes (Signed)
Patient verbalizes understanding of discharge instructions. Opportunity for questioning and answers were provided. Armband removed by staff, pt discharged from ED.  

## 2019-02-04 NOTE — ED Provider Notes (Signed)
[facility] EMERGENCY DEPARTMENT Provider Note   CSN: [id number] Arrival date & time: 02/04/19  1948    History   Chief Complaint Chief Complaint  Patient presents with   hand pain    HPI Sonya King is a 38 y.o. female with past medical history significant for anxiety, bipolar, chronic abdominal pain, drug-seeking behavior who presents for evaluation of hand pain.  Patient states she has had hand pain times years however worse over the last month.  States she was seen by [facility] who gave her ibuprofen and put her on Cymbalta for her chronic pain.  Patient states she has history of lupus and this feels similar.  Patient states "I need some pain medicine to help with this."  States she is on Suboxone however has not taken this over the last 3 days because she had some leftover "pain medicine" from a dental infection that she has been taking for her pain.  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 NO prior hx of carpal tunnel.  Denies fever, chills, nausea, vomiting, redness, swelling, decreased range of motion, numbness or tingling, rashes, lesions, history IV drug use.  History obtained from patient and past medical records.  No interpreter was used.    HPI  Past Medical History:  Diagnosis Date   Anxiety    Anxiety    Bipolar 1 disorder ([place])    Cancer ([place])    cervical   Chronic abdominal pain    Drug-seeking behavior    Ruptured lumbar disc     Patient Active Problem List   Diagnosis Date Noted   Cystitis, chronic 09/21/2016    Past Surgical History:  Procedure Laterality Date   ABDOMINAL HYSTERECTOMY     CHOLECYSTECTOMY     TONSILLECTOMY       OB History    Gravida  4   Para  4   Term  4   Preterm  0   AB  0   Living  0     SAB  0   TAB  0   Ectopic  0   Multiple  0   Live Births  4            Home Medications    Prior to Admission medications     Medication Sig Start Date End Date Taking? Authorizing Provider  ALPRAZolam Duanne Moron) 1 MG tablet Take 1 mg by mouth 3 (three) times daily as needed for anxiety.    [provider]  clonazePAM (KLONOPIN) 0.5 MG tablet Take 0.5 tablets by mouth 2 (two) times daily. 09/08/17   [provider]  methocarbamol (ROBAXIN) 750 MG tablet Take 2 tablets by mouth 4 (four) times daily. 09/15/17   [provider]  predniSONE (DELTASONE) 20 MG tablet Take 2 tablets (40 mg total) by mouth daily for 5 days. 02/04/19 02/09/19  Ginger Leeth A, PA-C  SUBOXONE 12-3 MG FILM Take 1 Film by mouth 2 (two) times daily. 11/03/17   [provider]  SUBOXONE 8-2 MG FILM Take 1 Film by mouth 2 (two) times daily. 11/03/17   [provider]  traMADol (ULTRAM) 50 MG tablet Take 2 tablets by mouth 4 (four) times daily. 10/12/17   [provider]  traZODone (DESYREL) 100 MG tablet Take 4 tablets by mouth at bedtime. 11/02/17   [provider]  VRAYLAR capsule Take 1 capsule by mouth at bedtime. 10/17/17   [provider]    Family History No family history on file.  Social History Social History   Tobacco Use   Smoking status: Current Every Day Smoker    Packs/day: 0.50    Types: Cigarettes   Smokeless tobacco: Never Used  Substance Use Topics   Alcohol use: No   Drug use: No     Allergies   Avocado and Benadryl [diphenhydramine hcl]   Review of Systems Review of Systems  Constitutional: Negative.   HENT: Negative.   Respiratory: Negative.   Cardiovascular: Negative.   Gastrointestinal: Negative.   Genitourinary: Negative.   Musculoskeletal:       Bilateral hand pain  Skin:       Blister to right palm  Neurological: Negative.   All other systems reviewed and are negative.  Physical Exam Updated Vital Signs BP 113/66 (BP Location: Right Arm)    Pulse 68    Temp 97.9 F (36.6 C) (Oral)    Resp 16    Ht 5\' 4"  (1.626 m)    Wt 72 kg     SpO2 97%    BMI 27.25 kg/m   Physical Exam Vitals signs and nursing note reviewed.  Constitutional:      General: She is not in acute distress.    Appearance: She is well-developed. She is not ill-appearing, toxic-appearing or diaphoretic.  HENT:     Head: Normocephalic and atraumatic.     Nose: Nose normal.     Mouth/Throat:     Mouth: Mucous membranes are moist.     Pharynx: Oropharynx is clear.  Eyes:     Pupils: Pupils are equal, round, and reactive to light.     Comments: Pin point pupils  Neck:     Musculoskeletal: Normal range of motion.  Cardiovascular:     Rate and Rhythm: Normal rate.     Pulses: Normal pulses.     Heart sounds: Normal heart sounds.  Pulmonary:     Effort: Pulmonary effort is normal. No respiratory distress.     Breath sounds: Normal breath sounds.  Abdominal:     General: Bowel sounds are normal. There is no distension.     Tenderness: There is no abdominal tenderness. There is no right CVA tenderness, left CVA tenderness, guarding or rebound.     Hernia: No hernia is present.  Musculoskeletal: Normal range of motion.     Right shoulder: Normal.     Left shoulder: Normal.     Right elbow: Normal.    Left elbow: Normal.     Right wrist: Normal.     Left wrist: Normal.     Right knee: Normal.     Left knee: Normal.     Right ankle: Normal.     Left ankle: Normal.     Right forearm: Normal.     Left forearm: Normal.     Right hand: Normal.     Left hand: Normal.     Comments: Full range of motion bilateral upper and lower extremities without difficulty.  She has full range of motion to bilateral wrists with flexion, extension, pronation, supination as well as thumb opposition.  No pain with movement to her wrist.  She has no swelling, redness or warmth.  Skin:    General: Skin is warm and dry.     Capillary Refill: Capillary refill takes less than 2 seconds.     Comments: Pea-sized callused blister to palmar aspect to right hand just proximal  to her  second metacarpal.  No edema, erythema, ecchymosis or warmth.  Brisk capillary refill.  No rashes or lesions.  Neurological:     General: No focal deficit present.     Mental Status: She is alert.     Sensory: Sensation is intact.     Motor: Motor function is intact.     Coordination: Coordination is intact.     Gait: Gait is intact.     Deep Tendon Reflexes: Reflexes are normal and symmetric.     Comments: 5/5 joints of bilateral upper extremities without difficulty.    ED Treatments / Results  Labs (all labs ordered are listed, but only abnormal results are displayed) Labs Reviewed - No data to display  EKG None  Radiology No results found.  Procedures Procedures (including critical care time)  Medications Ordered in ED Medications  predniSONE (DELTASONE) tablet 60 mg (60 mg Oral Given 02/04/19 2113)  HYDROcodone-acetaminophen (NORCO/VICODIN) 5-325 MG per tablet 1 tablet (1 tablet Oral Given 02/04/19 2113)   Initial Impression / Assessment and Plan / ED Course  I have reviewed the triage vital signs and the nursing notes.  Pertinent labs & imaging results that were available during my care of the patient were reviewed by me and considered in my medical decision making (see chart for details).  38 year old female appears otherwise well presents for evaluation of hand pain.  Afebrile, nonseptic, non-ill-appearing.  Pain chronic in nature however worse over the last month.  She has full range of motion.  Normal musculoskeletal exam.  Neurovascularly intact.  No systemic symptoms.  Patient does have past behavior for drug-seeking behavior.  Prior chart reviewed.  She has been seen prior for this multiple times.  Patient states she has lupus however this is not listed in her record.  Has no evidence of infectious process on exam.  She is texting on her phone on initial evaluation without any difficulty with her bilateral hands.  No evidence of carpal tunnel, tendon, ligament  injury however she does states she uses her hands frequently at home.  She has no evidence of joint swelling.  Denies history of IV drug use.  Has been taking leftover oxycodone at home for pain.  She does have pinpoint pupils.  She does take Suboxone as well however has not been taking this and she has been taking her oxycodone from home.  Discussed possible labs to assess her inflammatory markers however patient states "I just need pain medicine to take home, I just need pain medicine." Discussed risk versus benefit of not obtaining labs and patient voiced understanding however declines and is just requesting oral pain medicine.  Discussed steroids for inflammation to treat for possible lupus flair. Will  DC home with short course.  Patient states she has been seen by rheumatology 1 month ago however they have not given her anything for her chronic pain.  Discussed with patient given this is chronic in nature and review of her PMP aware database with high overdose risk do not feel comfortable prescribing her outpatient narcotics.  Will have her follow-up with rheumatology, PCP for reevaluation.  She does not appear septic, ill.  She is afebrile, without tachycardia, tachypnea or hypoxia.  Low suspicion for septic arthritis, septic joint, gout, hemarthrosis, bacterial process, tendon, ligament injury, DVT, myositis.  The patient has been appropriately medically screened and/or stabilized in the ED. I have low suspicion for any other emergent medical condition which would require further screening, evaluation or treatment in the ED or  require inpatient management.      Final Clinical Impressions(s) / ED Diagnoses   Final diagnoses:  Bilateral hand pain    ED Discharge Orders         Ordered    predniSONE (DELTASONE) 20 MG tablet  Daily     02/04/19 2122           Seraya Jobst A, PA-C 02/04/19 2129    Margette Fast, MD 02/07/19 1336

## 2019-02-04 NOTE — ED Triage Notes (Signed)
Pt c/o bilat hand pain without significant trauma x 1 month. 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 Pt reports she has been seen x 2 for same given Ibuprofen/cymbalta and had percocet all without relief.  Pt reports she feels her Lupus may be contributing to the pain.

## 2019-02-04 NOTE — Discharge Instructions (Addendum)
Take the steroids as prescribed.  May take Tylenol ibuprofen as needed for pain.  Follow-up with PCP for reevaluation for your chronic pain.

## 2019-04-13 ENCOUNTER — Emergency Department (HOSPITAL_COMMUNITY)
Admission: EM | Admit: 2019-04-13 | Discharge: 2019-04-13 | Disposition: A | Discharge status: Home / Self Care | Payer: Medicaid Other | Attending: Pediatric Emergency Medicine | Admitting: Pediatric Emergency Medicine

## 2019-04-13 ENCOUNTER — Emergency Department (HOSPITAL_COMMUNITY): Payer: Medicaid Other

## 2019-04-13 DIAGNOSIS — Y999 Unspecified external cause status: Secondary | ICD-10-CM | POA: Insufficient documentation

## 2019-04-13 DIAGNOSIS — S99921A Unspecified injury of right foot, initial encounter: Secondary | ICD-10-CM | POA: Insufficient documentation

## 2019-04-13 DIAGNOSIS — F17210 Nicotine dependence, cigarettes, uncomplicated: Secondary | ICD-10-CM | POA: Insufficient documentation

## 2019-04-13 DIAGNOSIS — Z79899 Other long term (current) drug therapy: Secondary | ICD-10-CM | POA: Diagnosis not present

## 2019-04-13 DIAGNOSIS — Y929 Unspecified place or not applicable: Secondary | ICD-10-CM | POA: Insufficient documentation

## 2019-04-13 DIAGNOSIS — Y939 Activity, unspecified: Secondary | ICD-10-CM | POA: Diagnosis not present

## 2019-04-13 DIAGNOSIS — X58XXXA Exposure to other specified factors, initial encounter: Secondary | ICD-10-CM | POA: Insufficient documentation

## 2019-04-13 MED ORDER — ACETAMINOPHEN 500 MG PO TABS
1000.0000 mg | ORAL_TABLET | Freq: Once | ORAL | Status: AC
Start: 2019-04-13 — End: 2019-04-13
  Administered 2019-04-13: 1000 mg via ORAL
  Filled 2019-04-13: qty 2

## 2019-04-13 NOTE — ED Notes (Signed)
Dr. Reichert at bedside.  

## 2019-04-13 NOTE — ED Triage Notes (Signed)
Pt to ED c/o injury to right foot. Reports tripped and hit her foot yesterday morning around 0730. Pt able to put weight on foot, noted walking outside prior to coming to triage. Foot is notable swollen, pulse noted. Reports taking Motrin with little relief.

## 2019-04-13 NOTE — Progress Notes (Signed)
Orthopedic Tech Progress Note Patient Details:  Sonya King [birth year] [id number]  Ortho Devices Type of Ortho Device: Postop shoe/boot, Crutches Ortho Device/Splint Location: right Ortho Device/Splint Interventions: Application   Post Interventions Patient Tolerated: Well Instructions Provided: Care of device   [place] Pink 04/13/2019, 12:04 PM

## 2019-04-13 NOTE — ED Provider Notes (Signed)
[facility] EMERGENCY DEPARTMENT Provider Note   CSN: [id number] Arrival date & time: 04/13/19  [id number]     History   Chief Complaint Chief Complaint  Patient presents with  . Foot Injury    right    HPI KAINANI LEAL is a 38 y.o. female.     HPI   38 year old female with right foot injury day prior to presentation.  Complex history as noted below.  Immediate pain and swelling noted to the foot limiting ambulation but able to bear weight.  Pain is persisted so now presents.  No fever cough or other sick symptoms.  Past Medical History:  Diagnosis Date  . Anxiety   . Anxiety   . Bipolar 1 disorder ([place])   . Cancer ([facility])    cervical  . Chronic abdominal pain   . Drug-seeking behavior   . Ruptured lumbar disc     Patient Active Problem List   Diagnosis Date Noted  . Cystitis, chronic 09/21/2016    Past Surgical History:  Procedure Laterality Date  . ABDOMINAL HYSTERECTOMY    . CHOLECYSTECTOMY    . TONSILLECTOMY       OB History    Gravida  4   Para  4   Term  4   Preterm  0   AB  0   Living  0     SAB  0   TAB  0   Ectopic  0   Multiple  0   Live Births  4            Home Medications    Prior to Admission medications   Medication Sig Start Date End Date Taking? Authorizing Provider  ALPRAZolam Duanne Moron) 1 MG tablet Take 1 mg by mouth 3 (three) times daily as needed for anxiety.    [provider]  clonazePAM (KLONOPIN) 0.5 MG tablet Take 0.5 tablets by mouth 2 (two) times daily. 09/08/17   [provider]  methocarbamol (ROBAXIN) 750 MG tablet Take 2 tablets by mouth 4 (four) times daily. 09/15/17   [provider]  SUBOXONE 12-3 MG FILM Take 1 Film by mouth 2 (two) times daily. 11/03/17   [provider]  SUBOXONE 8-2 MG FILM Take 1 Film by mouth 2 (two) times daily. 11/03/17   [provider]  traMADol (ULTRAM) 50 MG tablet Take 2 tablets by mouth 4 (four) times daily.  10/12/17   [provider]  traZODone (DESYREL) 100 MG tablet Take 4 tablets by mouth at bedtime. 11/02/17   [provider]  VRAYLAR capsule Take 1 capsule by mouth at bedtime. 10/17/17   [provider]    Family History No family history on file.  Social History Social History   Tobacco Use  . Smoking status: Current Every Day Smoker    Packs/day: 0.50    Types: Cigarettes  . Smokeless tobacco: Never Used  Substance Use Topics  . Alcohol use: No  . Drug use: No     Allergies   Avocado and Benadryl [diphenhydramine hcl]   Review of Systems Review of Systems  Constitutional: Negative for chills and fever.  HENT: Negative for ear pain and sore throat.   Eyes: Negative for pain and visual disturbance.  Respiratory: Negative for cough and shortness of breath.   Cardiovascular: Negative for chest pain and palpitations.  Gastrointestinal: Negative for abdominal pain and vomiting.  Genitourinary: Negative for dysuria and hematuria.  Musculoskeletal: Positive for arthralgias, gait problem  and myalgias. Negative for back pain.  Skin: Negative for color change and rash.  Neurological: Negative for seizures and syncope.  All other systems reviewed and are negative.    Physical Exam Updated Vital Signs BP 102/82   Pulse 89   Temp 98.8 F (37.1 C) (Oral)   Resp 15   Ht 5\' 4"  (1.626 m)   Wt 81.6 kg   SpO2 97%   BMI 30.90 kg/m   Physical Exam Vitals signs and nursing note reviewed.  Constitutional:      General: She is not in acute distress.    Appearance: She is well-developed.  HENT:     Head: Normocephalic and atraumatic.     Right Ear: Tympanic membrane normal.     Left Ear: Tympanic membrane normal.     Nose: No congestion or rhinorrhea.  Eyes:     Conjunctiva/sclera: Conjunctivae normal.  Neck:     Musculoskeletal: Neck supple.  Cardiovascular:     Rate and Rhythm: Normal rate and regular rhythm.     Heart sounds: No murmur.   Pulmonary:     Effort: Pulmonary effort is normal. No respiratory distress.     Breath sounds: Normal breath sounds.  Abdominal:     Palpations: Abdomen is soft.     Tenderness: There is no abdominal tenderness.  Musculoskeletal: Normal range of motion.        General: Swelling and tenderness present. No deformity.     Right lower leg: Edema present.  Skin:    General: Skin is warm and dry.  Neurological:     General: No focal deficit present.     Mental Status: She is alert and oriented to person, place, and time.     Cranial Nerves: No cranial nerve deficit.     Sensory: No sensory deficit.     Motor: No weakness.     Coordination: Coordination normal.     Gait: Gait abnormal.      ED Treatments / Results  Labs (all labs ordered are listed, but only abnormal results are displayed) Labs Reviewed - No data to display  EKG None  Radiology No results found.  Procedures Procedures (including critical care time)  Medications Ordered in ED Medications  acetaminophen (TYLENOL) tablet 1,000 mg (1,000 mg Oral Given 04/13/19 1106)     Initial Impression / Assessment and Plan / ED Course  I have reviewed the triage vital signs and the nursing notes.  Pertinent labs & imaging results that were available during my care of the patient were reviewed by me and considered in my medical decision making (see chart for details).        38 year old woman with right foot injury day prior.  Normal nerve exam.  Normal vascular exam.  Doubt nerve or vascular injury at this time.  Swelling noted.  X-ray obtained showed no fractures on my interpretation.  Radiology read as above.  Doubt bony injury.  Pain controlled with Tylenol and postop boot.  Crutches provided for ambulation and patient okay for discharge.  Final Clinical Impressions(s) / ED Diagnoses   Final diagnoses:  Injury of right foot, initial encounter    ED Discharge Orders    None       Brent Bulla, MD  04/15/19 1636

## 2019-04-13 NOTE — ED Provider Notes (Signed)
[facility] EMERGENCY DEPARTMENT Provider Note   CSN: [id number] Arrival date & time: 04/13/19  [id number]     History   Chief Complaint Chief Complaint  Patient presents with  . Foot Injury    right    HPI Sonya King is a 38 y.o. female.     HPI   38 year old female with medical conditions as below comes to [place] for right foot injury 24 hours prior to presentation.  Patient tripped on metal bed frame with immediate swelling and pain to the right foot.  Attempted relief with Motrin with minimal improvement and continued swelling so presents.  No fevers cough or other sick symptoms.  Past Medical History:  Diagnosis Date  . Anxiety   . Anxiety   . Bipolar 1 disorder ([place])   . Cancer ([facility])    cervical  . Chronic abdominal pain   . Drug-seeking behavior   . Ruptured lumbar disc     Patient Active Problem List   Diagnosis Date Noted  . Cystitis, chronic 09/21/2016    Past Surgical History:  Procedure Laterality Date  . ABDOMINAL HYSTERECTOMY    . CHOLECYSTECTOMY    . TONSILLECTOMY       OB History    Gravida  4   Para  4   Term  4   Preterm  0   AB  0   Living  0     SAB  0   TAB  0   Ectopic  0   Multiple  0   Live Births  4            Home Medications    Prior to Admission medications   Medication Sig Start Date End Date Taking? Authorizing Provider  ALPRAZolam Duanne Moron) 1 MG tablet Take 1 mg by mouth 3 (three) times daily as needed for anxiety.    [provider]  clonazePAM (KLONOPIN) 0.5 MG tablet Take 0.5 tablets by mouth 2 (two) times daily. 09/08/17   [provider]  methocarbamol (ROBAXIN) 750 MG tablet Take 2 tablets by mouth 4 (four) times daily. 09/15/17   [provider]  SUBOXONE 12-3 MG FILM Take 1 Film by mouth 2 (two) times daily. 11/03/17   [provider]  SUBOXONE 8-2 MG FILM Take 1 Film by mouth 2 (two) times daily. 11/03/17   [provider]  traMADol  (ULTRAM) 50 MG tablet Take 2 tablets by mouth 4 (four) times daily. 10/12/17   [provider]  traZODone (DESYREL) 100 MG tablet Take 4 tablets by mouth at bedtime. 11/02/17   [provider]  VRAYLAR capsule Take 1 capsule by mouth at bedtime. 10/17/17   [provider]    Family History No family history on file.  Social History Social History   Tobacco Use  . Smoking status: Current Every Day Smoker    Packs/day: 0.50    Types: Cigarettes  . Smokeless tobacco: Never Used  Substance Use Topics  . Alcohol use: No  . Drug use: No     Allergies   Avocado and Benadryl [diphenhydramine hcl]   Review of Systems Review of Systems  Constitutional: Negative for activity change and fever.  HENT: Negative for congestion.   Respiratory: Negative for cough and shortness of breath.   Cardiovascular: Negative for chest pain.  Gastrointestinal: Negative for abdominal pain, diarrhea and vomiting.  Genitourinary: Negative for dysuria.  Musculoskeletal: Positive for arthralgias, gait problem and myalgias.  Skin: Negative  for rash and wound.  All other systems reviewed and are negative.    Physical Exam Updated Vital Signs BP 117/81 (BP Location: Left Arm)   Pulse 91   Temp 98.4 F (36.9 C) (Oral)   Resp 16   Ht 5\' 4"  (1.626 m)   Wt 81.6 kg   SpO2 98%   BMI 30.90 kg/m   Physical Exam Vitals signs and nursing note reviewed.  Constitutional:      General: She is not in acute distress.    Appearance: She is well-developed.  HENT:     Head: Normocephalic and atraumatic.  Eyes:     Conjunctiva/sclera: Conjunctivae normal.  Neck:     Musculoskeletal: Neck supple.  Cardiovascular:     Rate and Rhythm: Normal rate and regular rhythm.     Heart sounds: No murmur.  Pulmonary:     Effort: Pulmonary effort is normal. No respiratory distress.     Breath sounds: Normal breath sounds.  Abdominal:     Palpations: Abdomen is soft.     Tenderness: There is  no abdominal tenderness.  Musculoskeletal:     Comments: Right foot ecchymoses noted with tenderness over palpation of the entirety of the foot able to wiggle toes without deficit equal sensation bilaterally to lower extremities 2+ DP pulses noted, no other injuries appreciated  Skin:    General: Skin is warm and dry.     Capillary Refill: Capillary refill takes less than 2 seconds.  Neurological:     General: No focal deficit present.     Mental Status: She is alert.     Sensory: No sensory deficit.     Motor: No weakness.     Gait: Gait abnormal.     Deep Tendon Reflexes: Reflexes normal.      ED Treatments / Results  Labs (all labs ordered are listed, but only abnormal results are displayed) Labs Reviewed - No data to display  EKG None  Radiology Dg Foot Complete Right  Result Date: 04/13/2019 CLINICAL DATA:  38 year old female with a history of right foot injury EXAM: RIGHT FOOT COMPLETE - 3+ VIEW COMPARISON:  None. FINDINGS: There is no evidence of fracture or dislocation. There is no evidence of arthropathy or other focal bone abnormality. Soft tissues are unremarkable. IMPRESSION: Negative. Electronically Signed   By: Corrie Mckusick D.O.   On: 04/13/2019 09:20    Procedures Procedures (including critical care time)  Medications Ordered in ED Medications  acetaminophen (TYLENOL) tablet 1,000 mg (1,000 mg Oral Given 04/13/19 1106)     Initial Impression / Assessment and Plan / ED Course  I have reviewed the triage vital signs and the nursing notes.  Pertinent labs & imaging results that were available during my care of the patient were reviewed by me and considered in my medical decision making (see chart for details).        Patient is a 38 year old female who comes to [place] for right foot injury.  No wound requiring closure.  Normal nerve exam.  Normal vascular exam. No pain with squeeze.  No point tenderness over navicular bone.  X-ray obtained without fracture on  my interpretation.  Radiology read as above.  Patient on chart review with polysubstance abuse history and will treat pain with NSAIDs and Tylenol as outpatient.  Postop shoe and crutches provided as well as instructions for symptomatic care and close PCP follow-up.   Final Clinical Impressions(s) / ED Diagnoses   Final diagnoses:  Injury of right  foot, initial encounter    ED Discharge Orders    None       Brent Bulla, MD 04/13/19 1109

## 2019-06-28 ENCOUNTER — Other Ambulatory Visit: Payer: Self-pay

## 2019-06-28 ENCOUNTER — Emergency Department (HOSPITAL_COMMUNITY)
Admission: EM | Admit: 2019-06-28 | Discharge: 2019-06-29 | Disposition: A | Discharge status: Home / Self Care | Payer: Medicaid Other | Attending: Emergency Medicine | Admitting: Emergency Medicine

## 2019-06-28 ENCOUNTER — Encounter (HOSPITAL_COMMUNITY): Payer: Self-pay

## 2019-06-28 ENCOUNTER — Emergency Department (HOSPITAL_COMMUNITY): Payer: Medicaid Other

## 2019-06-28 DIAGNOSIS — R1010 Upper abdominal pain, unspecified: Secondary | ICD-10-CM

## 2019-06-28 DIAGNOSIS — Z79899 Other long term (current) drug therapy: Secondary | ICD-10-CM | POA: Insufficient documentation

## 2019-06-28 DIAGNOSIS — F17210 Nicotine dependence, cigarettes, uncomplicated: Secondary | ICD-10-CM | POA: Diagnosis not present

## 2019-06-28 DIAGNOSIS — N83202 Unspecified ovarian cyst, left side: Secondary | ICD-10-CM | POA: Insufficient documentation

## 2019-06-28 LAB — CBC
HCT: 42.5 % (ref 36.0–46.0)
Hemoglobin: 14 g/dL (ref 12.0–15.0)
MCH: 30.9 pg (ref 26.0–34.0)
MCHC: 32.9 g/dL (ref 30.0–36.0)
MCV: 93.8 fL (ref 80.0–100.0)
Platelets: 314 10*3/uL (ref 150–400)
RBC: 4.53 MIL/uL (ref 3.87–5.11)
RDW: 13.5 % (ref 11.5–15.5)
WBC: 9.5 10*3/uL (ref 4.0–10.5)
nRBC: 0 % (ref 0.0–0.2)

## 2019-06-28 LAB — URINALYSIS, ROUTINE W REFLEX MICROSCOPIC
Bilirubin Urine: NEGATIVE
Glucose, UA: NEGATIVE mg/dL
Ketones, ur: NEGATIVE mg/dL
Nitrite: NEGATIVE
Protein, ur: NEGATIVE mg/dL
Specific Gravity, Urine: 1.014 (ref 1.005–1.030)
pH: 7 (ref 5.0–8.0)

## 2019-06-28 LAB — COMPREHENSIVE METABOLIC PANEL
ALT: 7 U/L (ref 0–44)
AST: 18 U/L (ref 15–41)
Albumin: 4.2 g/dL (ref 3.5–5.0)
Alkaline Phosphatase: 75 U/L (ref 38–126)
Anion gap: 10 (ref 5–15)
BUN: 8 mg/dL (ref 6–20)
CO2: 24 mmol/L (ref 22–32)
Calcium: 9.2 mg/dL (ref 8.9–10.3)
Chloride: 105 mmol/L (ref 98–111)
Creatinine, Ser: 0.72 mg/dL (ref 0.44–1.00)
GFR calc Af Amer: >60 mL/min (ref >60)
GFR calc non Af Amer: >60 mL/min (ref >60)
Glucose, Bld: 103 mg/dL — ABNORMAL HIGH (ref 70–99)
Potassium: 4.3 mmol/L (ref 3.5–5.1)
Sodium: 139 mmol/L (ref 135–145)
Total Bilirubin: 0.5 mg/dL (ref 0.3–1.2)
Total Protein: 7.4 g/dL (ref 6.5–8.1)

## 2019-06-28 LAB — LIPASE, BLOOD: Lipase: 16 U/L (ref 11–51)

## 2019-06-28 LAB — I-STAT BETA HCG BLOOD, ED (MC, WL, AP ONLY): I-stat hCG, quantitative: <5 m[IU]/mL (ref <5)

## 2019-06-28 MED ORDER — FAMOTIDINE 20 MG PO TABS
20.0000 mg | ORAL_TABLET | Freq: Once | ORAL | Status: AC
  Administered 2019-06-28: 23:00:00 20 mg via ORAL
  Filled 2019-06-28: qty 1

## 2019-06-28 MED ORDER — MORPHINE SULFATE (PF) 4 MG/ML IV SOLN
4.0000 mg | Freq: Once | INTRAVENOUS | Status: AC
  Administered 2019-06-28: 21:00:00 4 mg via INTRAVENOUS
  Filled 2019-06-28: qty 1

## 2019-06-28 MED ORDER — IOHEXOL 300 MG/ML  SOLN
100.0000 mL | Freq: Once | INTRAMUSCULAR | Status: AC | PRN
  Administered 2019-06-28: 22:00:00 100 mL via INTRAVENOUS

## 2019-06-28 MED ORDER — SODIUM CHLORIDE 0.9% FLUSH
3.0000 mL | Freq: Once | INTRAVENOUS | Status: AC
  Administered 2019-06-28: 3 mL via INTRAVENOUS

## 2019-06-28 MED ORDER — ALUM & MAG HYDROXIDE-SIMETH 200-200-20 MG/5ML PO SUSP
30.0000 mL | Freq: Once | ORAL | Status: AC
  Administered 2019-06-28: 23:00:00 30 mL via ORAL
  Filled 2019-06-28: qty 30

## 2019-06-28 MED ORDER — SODIUM CHLORIDE 0.9 % IV BOLUS
1000.0000 mL | Freq: Once | INTRAVENOUS | Status: AC
  Administered 2019-06-28: 21:00:00 1000 mL via INTRAVENOUS

## 2019-06-28 MED ORDER — ONDANSETRON HCL 4 MG/2ML IJ SOLN
4.0000 mg | Freq: Once | INTRAMUSCULAR | Status: AC
  Administered 2019-06-28: 4 mg via INTRAVENOUS
  Filled 2019-06-28: qty 2

## 2019-06-28 MED ORDER — HYDROMORPHONE HCL 1 MG/ML IJ SOLN
1.0000 mg | Freq: Once | INTRAMUSCULAR | Status: AC
  Administered 2019-06-28: 1 mg via INTRAVENOUS
  Filled 2019-06-28: qty 1

## 2019-06-28 NOTE — ED Notes (Signed)
Culture sent w/ urine sample

## 2019-06-28 NOTE — ED Notes (Signed)
Full rainbow sent to lab.  

## 2019-06-28 NOTE — ED Triage Notes (Signed)
Pt c/o lower abd pain shooting from the LLQ to RLQ since yesterday. Pt denies N/V/D, states her BMs have been normal . Pt states that the pain has kept her awake at night.

## 2019-06-28 NOTE — ED Provider Notes (Signed)
[facility]PT Provider Note   CSN: [id number] Arrival date & time: 06/28/19  1810     History Chief Complaint  Patient presents with  . Abdominal Pain    Sonya King is a 39 y.o. female.  Patient c/o mid/diffuse abdominal pain in the past 2 days. Symptoms acute onset, moderate, constant, dull, non radiating. Denies hx same pain. +nausea. No vomiting or diarrhea. Normal bm yesterday. No distension. Remote hx cholecystectomy and hysterectomy. No fever or chills. No dysuria or gu c/o. No back or flank pain. No chest pain or discomfort.. no sob.   The history is provided by the patient.  Abdominal Pain Associated symptoms: nausea   Associated symptoms: no chest pain, no dysuria, no fever, no shortness of breath and no sore throat        Past Medical History:  Diagnosis Date  . Anxiety   . Anxiety   . Bipolar 1 disorder ([place])   . Cancer (HCC)    cervical  . Chronic abdominal pain   . Drug-seeking behavior   . Ruptured lumbar disc     Patient Active Problem List   Diagnosis Date Noted  . Cystitis, chronic 09/21/2016    Past Surgical History:  Procedure Laterality Date  . ABDOMINAL HYSTERECTOMY    . CHOLECYSTECTOMY    . TONSILLECTOMY       OB History    Gravida  4   Para  4   Term  4   Preterm  0   AB  0   Living  0     SAB  0   TAB  0   Ectopic  0   Multiple  0   Live Births  4           History reviewed. No pertinent family history.  Social History   Tobacco Use  . Smoking status: Current Every Day Smoker    Packs/day: 0.50    Types: Cigarettes  . Smokeless tobacco: Never Used  Substance Use Topics  . Alcohol use: No  . Drug use: No    Home Medications Prior to Admission medications   Medication Sig Start Date End Date Taking? Authorizing Provider  Buprenorphine HCl-Naloxone HCl (SUBOXONE) 2-0.5 MG FILM Place 0.5 Film under the tongue daily.   Yes [provider]  clonazePAM  (KLONOPIN) 1 MG tablet Take 3 mg by mouth daily. 06/21/19  Yes [provider]  zolpidem (AMBIEN) 10 MG tablet Take 10 mg by mouth at bedtime.  06/21/19  Yes [provider]    Allergies    Avocado and Benadryl [diphenhydramine hcl]  Review of Systems   Review of Systems  Constitutional: Negative for fever.  HENT: Negative for sore throat.   Eyes: Negative for redness.  Respiratory: Negative for shortness of breath.   Cardiovascular: Negative for chest pain.  Gastrointestinal: Positive for abdominal pain and nausea.  Endocrine: Negative for polyuria.  Genitourinary: Negative for dysuria and flank pain.  Musculoskeletal: Negative for back pain.  Skin: Negative for rash.  Neurological: Negative for headaches.  Hematological: Does not bruise/bleed easily.  Psychiatric/Behavioral: Negative for confusion.    Physical Exam Updated Vital Signs BP (!) 94/58 (BP Location: Right Arm)   Pulse 81   Temp 98.1 F (36.7 C) (Oral)   Resp 17   SpO2 94%   Physical Exam Vitals and nursing note reviewed.  Constitutional:      Appearance: Normal appearance. She is well-developed.  HENT:  Head: Atraumatic.     Nose: Nose normal.     Mouth/Throat:     Mouth: Mucous membranes are moist.  Eyes:     General: No scleral icterus.    Conjunctiva/sclera: Conjunctivae normal.  Neck:     Trachea: No tracheal deviation.  Cardiovascular:     Rate and Rhythm: Normal rate and regular rhythm.     Pulses: Normal pulses.     Heart sounds: Normal heart sounds. No murmur. No friction rub. No gallop.   Pulmonary:     Effort: Pulmonary effort is normal. No respiratory distress.     Breath sounds: Normal breath sounds.  Abdominal:     General: Bowel sounds are normal. There is no distension.     Palpations: Abdomen is soft. There is no mass.     Tenderness: There is abdominal tenderness. There is no guarding or rebound.     Hernia: No hernia is present.     Comments: Mid and upper  abd tenderness.   Genitourinary:    Comments: No cva tenderness.  Musculoskeletal:        General: No swelling.     Cervical back: Normal range of motion and neck supple. No rigidity. No muscular tenderness.  Skin:    General: Skin is warm and dry.     Findings: No rash.  Neurological:     Mental Status: She is alert.     Comments: Alert, speech normal.   Psychiatric:        Mood and Affect: Mood normal.     ED Results / Procedures / Treatments   Labs (all labs ordered are listed, but only abnormal results are displayed) Results for orders placed or performed during the hospital encounter of 06/28/19  Lipase, blood  Result Value Ref Range   Lipase 16 11 - 51 U/L  Comprehensive metabolic panel  Result Value Ref Range   Sodium 139 135 - 145 mmol/L   Potassium 4.3 3.5 - 5.1 mmol/L   Chloride 105 98 - 111 mmol/L   CO2 24 22 - 32 mmol/L   Glucose, Bld 103 (H) 70 - 99 mg/dL   BUN 8 6 - 20 mg/dL   Creatinine, Ser 0.72 0.44 - 1.00 mg/dL   Calcium 9.2 8.9 - 10.3 mg/dL   Total Protein 7.4 6.5 - 8.1 g/dL   Albumin 4.2 3.5 - 5.0 g/dL   AST 18 15 - 41 U/L   ALT 7 0 - 44 U/L   Alkaline Phosphatase 75 38 - 126 U/L   Total Bilirubin 0.5 0.3 - 1.2 mg/dL   GFR calc non Af Amer >60 >60 mL/min   GFR calc Af Amer >60 >60 mL/min   Anion gap 10 5 - 15  CBC  Result Value Ref Range   WBC 9.5 4.0 - 10.5 K/uL   RBC 4.53 3.87 - 5.11 MIL/uL   Hemoglobin 14.0 12.0 - 15.0 g/dL   HCT 42.5 36.0 - 46.0 %   MCV 93.8 80.0 - 100.0 fL   MCH 30.9 26.0 - 34.0 pg   MCHC 32.9 30.0 - 36.0 g/dL   RDW 13.5 11.5 - 15.5 %   Platelets 314 150 - 400 K/uL   nRBC 0.0 0.0 - 0.2 %  Urinalysis, Routine w reflex microscopic  Result Value Ref Range   Color, Urine YELLOW YELLOW   APPearance CLEAR CLEAR   Specific Gravity, Urine 1.014 1.005 - 1.030   pH 7.0 5.0 - 8.0   Glucose, UA NEGATIVE  NEGATIVE mg/dL   Hgb urine dipstick SMALL (A) NEGATIVE   Bilirubin Urine NEGATIVE NEGATIVE   Ketones, ur NEGATIVE NEGATIVE  mg/dL   Protein, ur NEGATIVE NEGATIVE mg/dL   Nitrite NEGATIVE NEGATIVE   Leukocytes,Ua TRACE (A) NEGATIVE   RBC / HPF 0-5 0 - 5 RBC/hpf   WBC, UA 6-10 0 - 5 WBC/hpf   Bacteria, UA RARE (A) NONE SEEN   Squamous Epithelial / LPF 0-5 0 - 5  I-Stat beta hCG blood, ED  Result Value Ref Range   I-stat hCG, quantitative <5.0 <5 mIU/mL   Comment 3           EKG None  Radiology CT Abdomen Pelvis W Contrast  Result Date: 06/28/2019 CLINICAL DATA:  39 year old female with acute abdominal pain. EXAM: CT ABDOMEN AND PELVIS WITH CONTRAST TECHNIQUE: Multidetector CT imaging of the abdomen and pelvis was performed using the standard protocol following bolus administration of intravenous contrast. CONTRAST:  16mL OMNIPAQUE IOHEXOL 300 MG/ML  SOLN COMPARISON:  CT abdomen pelvis dated 03/11/2017. FINDINGS: Lower chest: The visualized lung bases are clear. No intra-abdominal free air or free fluid. Hepatobiliary: The liver is unremarkable. No intrahepatic biliary ductal dilatation. Cholecystectomy. Pancreas: Unremarkable. No pancreatic ductal dilatation or surrounding inflammatory changes. Spleen: Normal in size without focal abnormality. Adrenals/Urinary Tract: The adrenal glands are unremarkable. The kidneys, visualized ureters, and urinary bladder appear unremarkable. Stomach/Bowel: There is moderate stool throughout the colon. There is no bowel obstruction or active inflammation. The appendix is normal as visualized. Vascular/Lymphatic: The abdominal aorta and IVC are unremarkable. No portal venous gas. There is no adenopathy. Reproductive: Hysterectomy. There is a 2 cm indeterminate left ovarian lesion, likely a complex cyst. Ultrasound may provide better evaluation. Other: None Musculoskeletal: No acute or significant osseous findings. IMPRESSION: 1. Moderate colonic stool burden. No bowel obstruction or active inflammation. Normal appendix. 2. A 2 cm left adnexal probable complex cyst or corpus luteum.  Ultrasound may provide better evaluation. Electronically Signed   By: Anner Crete M.D.   On: 06/28/2019 22:13    Procedures Procedures (including critical care time)  Medications Ordered in ED Medications  morphine 4 MG/ML injection 4 mg (has no administration in time range)  ondansetron (ZOFRAN) injection 4 mg (has no administration in time range)  sodium chloride flush (NS) 0.9 % injection 3 mL (3 mLs Intravenous Given 06/28/19 2011)  sodium chloride 0.9 % bolus 1,000 mL (1,000 mLs Intravenous New Bag/Given 06/28/19 2042)    ED Course  I have reviewed the triage vital signs and the nursing notes.  Pertinent labs & imaging results that were available during my care of the patient were reviewed by me and considered in my medical decision making (see chart for details).    MDM Rules/Calculators/A&P                     Labs sent. Iv ns bolus.  Reviewed nursing notes and prior charts for additional history.   Labs reviewed/interpreted by me - chem normal. Wbc normal. Possible uti on labs. Keflex po.   Persistent pain and tenderness. Morphine iv. zofran iv. Ct.   Pain improved w meds but persists. Dilaudid 1 mg iv.   Pain improved w meds. Await CT.  CT reviewed/interpreted by me - no acute intraabd process. Patients discomfort is upper abdomen. No LLQ or pelvis pain or tenderness on repeat exam.   Ns bolus. Prior bps in range as current  - upper 90s to low 100s.  Rec close pcp f/u, gyn f/u re ovarian cystic structure.   rx protonix, keflex.  Patient currently comfortable. No nv. Afebrile. Patient currently appears stable for d/c.   Return precautions provided.       Final Clinical Impression(s) / ED Diagnoses Final diagnoses:  None    Rx / DC Orders ED Discharge Orders    None       Lajean Saver, MD 06/29/19 [phone]

## 2019-06-29 MED ORDER — PANTOPRAZOLE SODIUM 40 MG PO TBEC: 40.0000 mg | DELAYED_RELEASE_TABLET | Freq: Every day | ORAL | 0 refills | Status: DC

## 2019-06-29 MED ORDER — CEPHALEXIN 500 MG PO CAPS: 1000.0000 mg | ORAL_CAPSULE | Freq: Two times a day (BID) | ORAL | 0 refills | Status: DC

## 2019-06-29 MED ORDER — SODIUM CHLORIDE 0.9 % IV BOLUS
500.0000 mL | Freq: Once | INTRAVENOUS | Status: AC
  Administered 2019-06-29: 500 mL via INTRAVENOUS

## 2019-06-29 MED ORDER — CEPHALEXIN 500 MG PO CAPS
500.0000 mg | ORAL_CAPSULE | Freq: Once | ORAL | Status: AC
  Administered 2019-06-29: 500 mg via ORAL
  Filled 2019-06-29: qty 1

## 2019-06-29 NOTE — Discharge Instructions (Addendum)
It was our pleasure to provide your ER care today - we hope that you feel better.  Rest. Drink plenty of fluids.   The lab tests show a possible urine infection - take antibiotic as prescribed.   Your CT scan was read as follows: IMPRESSION:  1. Moderate colonic stool burden. No bowel obstruction or active inflammation. Normal appendix.  2. A 2 cm left adnexal probable complex cyst or corpus luteum.   Regarding ovarian cystic structure, follow up with ob/gyn doctor in the next 1-2 weeks - call office for appointment.   For upper abdominal pain, take acid blocker medication as prescribed. You may also try pepcid or maalox for symptom relief. If constipation, try miralax (laxative) and colace (stool softener) as need.   Follow up with primary care doctor in 1 week.   Return to ER if worse, new symptoms, fevers, worsening or severe pain, persistent vomiting, or other concern.   You were given pain medication in the ER - no driving for the next 8 hours.

## 2019-06-29 NOTE — ED Notes (Signed)
Patient was verbalized discharge instructions. Pt had no further questions at this time. NAD. 

## 2019-10-02 IMAGING — CT CT HEAD W/O CM
5 of 8 series · 16 of 47 positions shown, 17 images · non-contrast
Comparison: None.

CLINICAL DATA: Recurrent syncopal episodes for 2 weeks. Head injury
with syncope. Headache and neck pain.

EXAM:
CT HEAD WITHOUT CONTRAST
CT CERVICAL SPINE WITHOUT CONTRAST
TECHNIQUE: Multidetector CT imaging of the head and cervical spine was
performed following the standard protocol without intravenous
contrast. Multiplanar CT image reconstructions of the cervical spine
were also generated.

[Series 3: head without · axial · non-contrast · 0.44mm/px · z∈[-98,+42]mm · 3 of 29 slices shown, 4 images]
[im 1/29  brain]
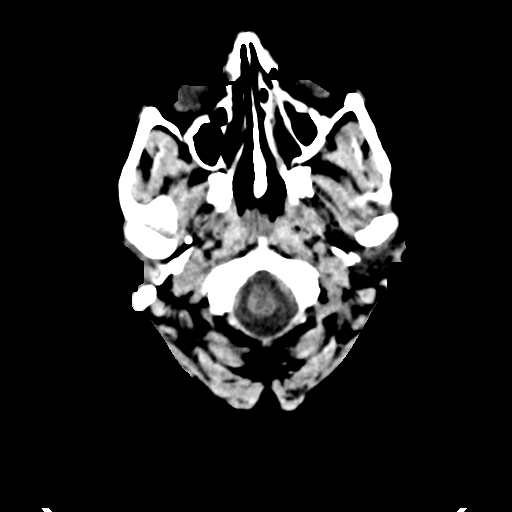
[im 1/29  bone]
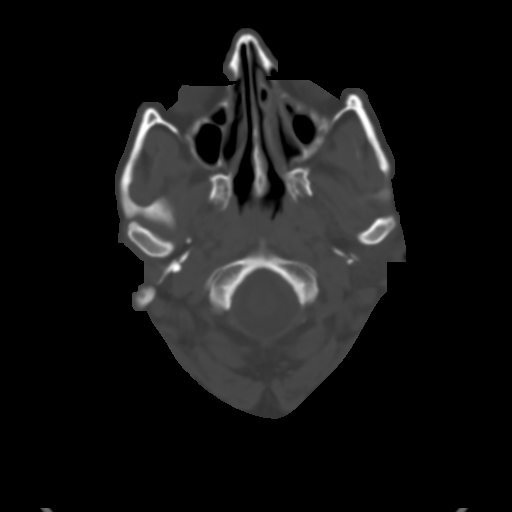
[im 15/29  brain]
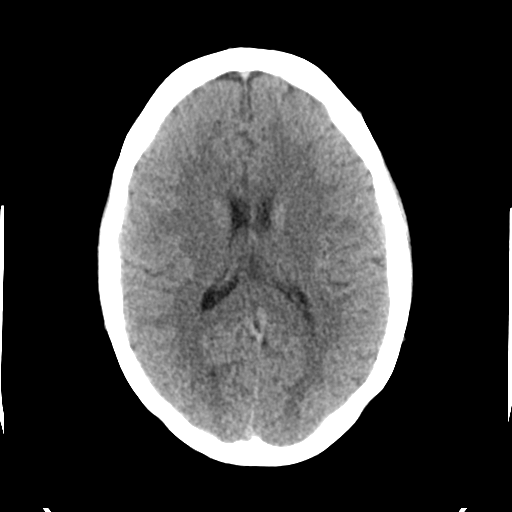
[im 29/29  brain]
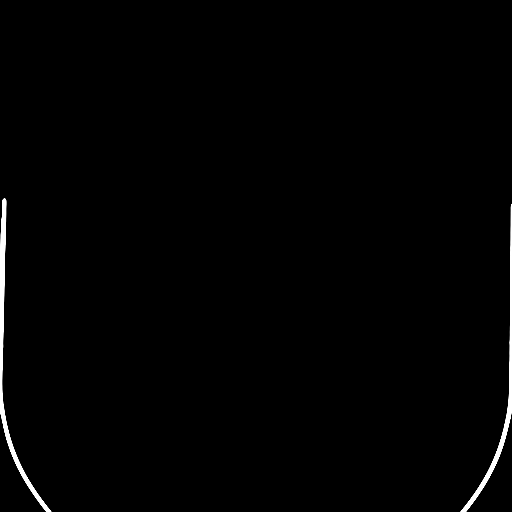

[Series 4: head bone · axial · 0.44mm/px · z∈[-78,+24]mm · 6 of 73 slices shown]
[im 11/73  bone]
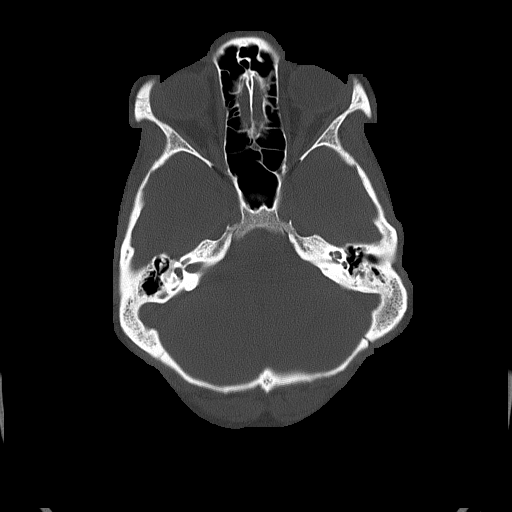
[im 21/73  bone]
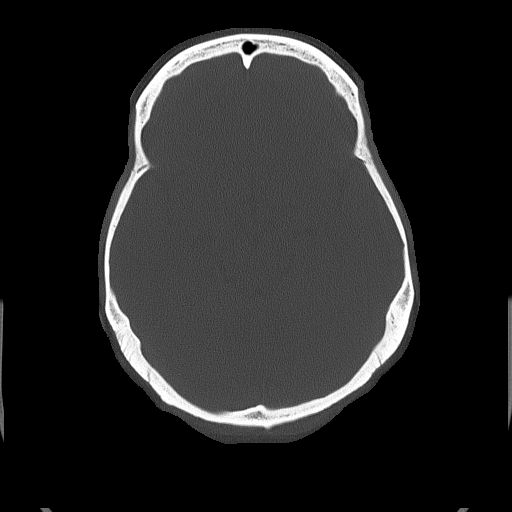
[im 31/73  bone]
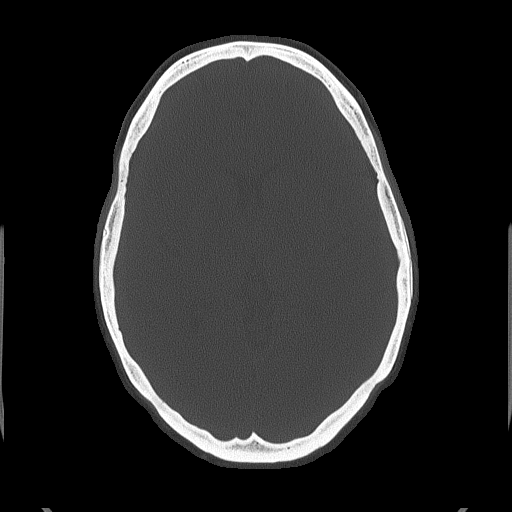
[im 42/73  bone]
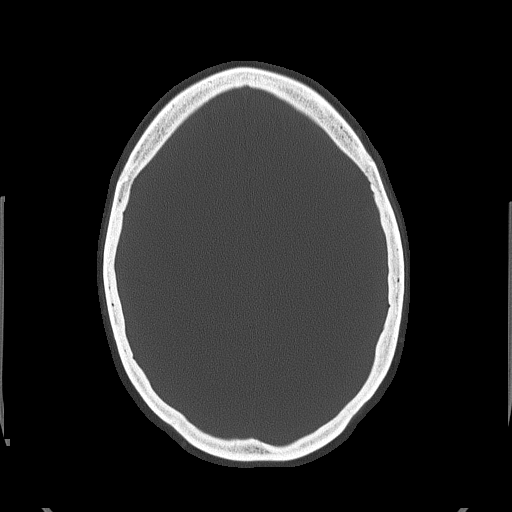
[im 52/73  bone]
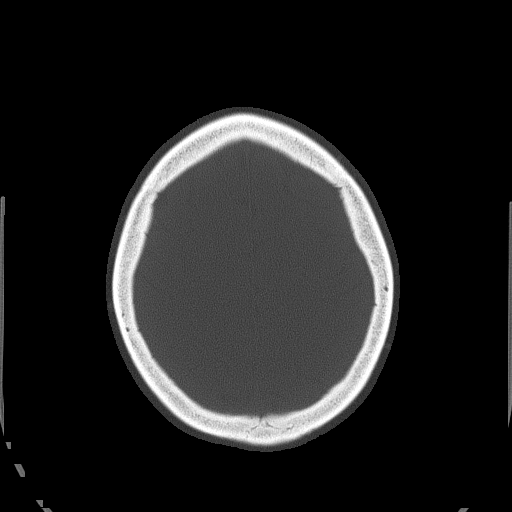
[im 62/73  bone]
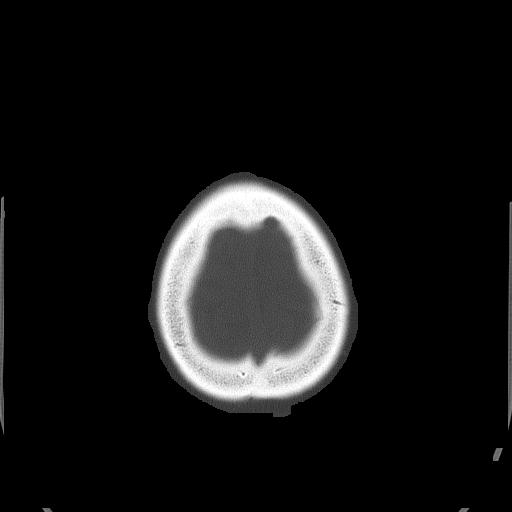

[Series 6: head without sag · sagittal · non-contrast · 0.27mm/px · 1 of 55 slices shown]
[im 28/55  brain]
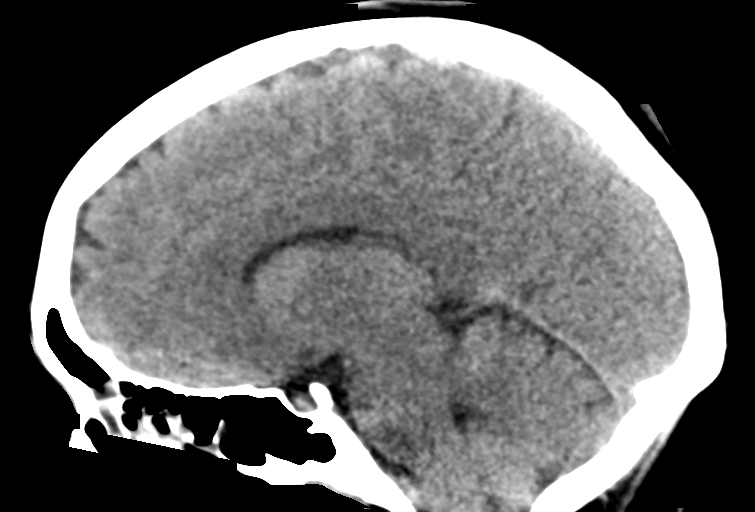

[Series 7: c_spine 2.0 st · axial · 0.34mm/px · z∈[-256,-216]mm · 3 of 88 slices shown]
[im 10/88  brain]
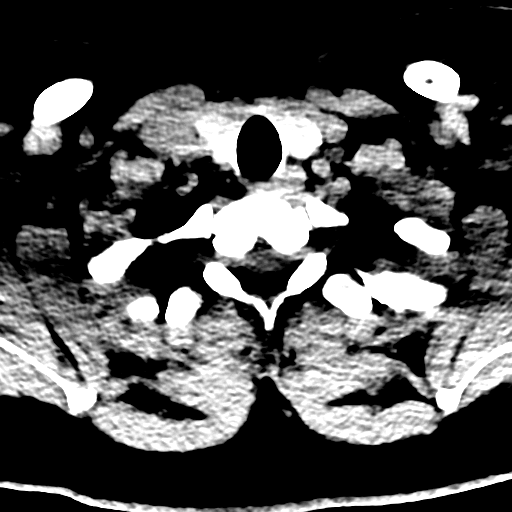
[im 20/88  brain]
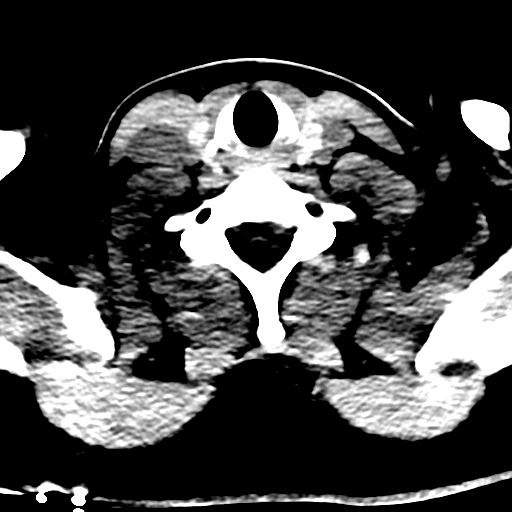
[im 30/88  brain]
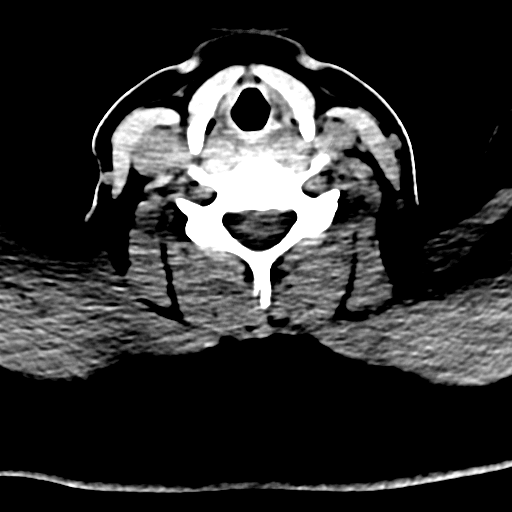

[Series 10: c_spine 2.0 cor bone · coronal · 0.26mm/px · 3 of 61 slices shown]
[im 18/61  brain]
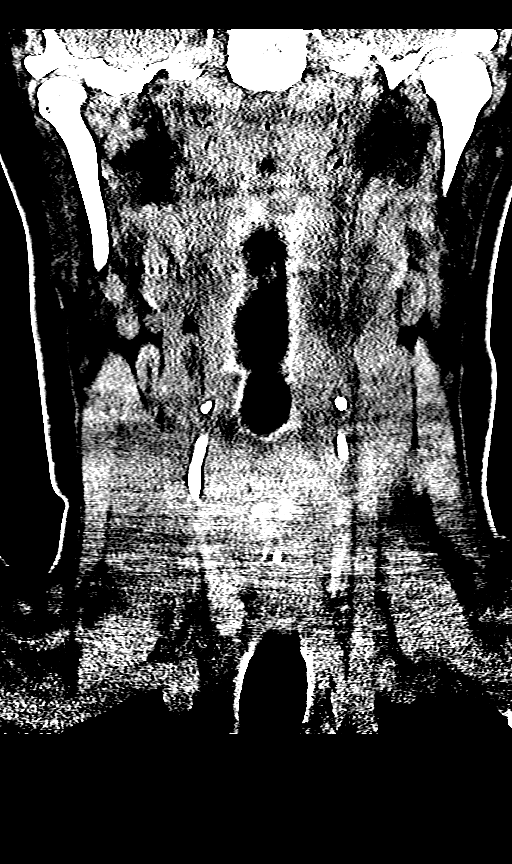
[im 26/61  brain]
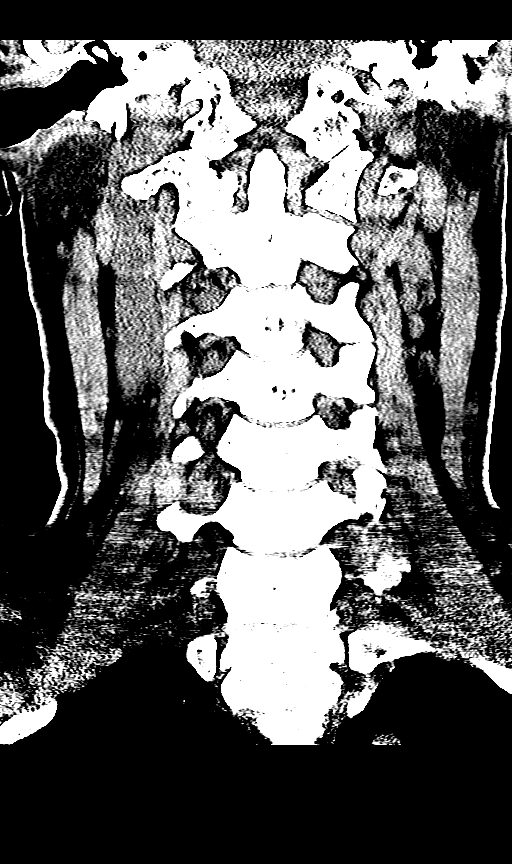
[im 35/61  brain]
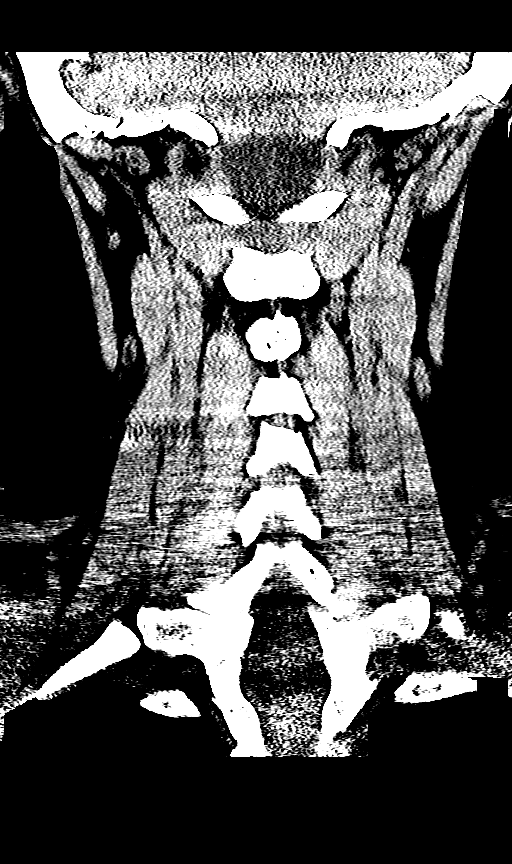

[16 of 47 positions shown; findings below may reference images not displayed]

FINDINGS: CT HEAD FINDINGS

BRAIN: No intraparenchymal hemorrhage, mass effect nor midline
shift. The ventricles and sulci are normal. No acute large vascular
territory infarcts. No abnormal extra-axial fluid collections. Basal
cisterns are patent.

VASCULAR: Unremarkable.

SKULL/SOFT TISSUES: No skull fracture. No significant soft tissue
swelling.

ORBITS/SINUSES: The included ocular globes and orbital contents are
normal.Moderate lobulated paranasal sinus mucosal thickening. Under
pneumatized mastoid air cells bilaterally without effusion.

OTHER: None.

CT CERVICAL SPINE FINDINGS

ALIGNMENT: Maintained lordosis. Vertebral bodies in alignment.

SKULL BASE AND VERTEBRAE: Cervical vertebral bodies and posterior
elements are intact. Longus coli insertional enthesopathy.
Intervertebral disc heights preserved. No destructive bony lesions.
C1-2 articulation maintained.

SOFT TISSUES AND SPINAL CANAL: Normal.

DISC LEVELS: No significant osseous canal stenosis or neural
foraminal narrowing.

UPPER CHEST: Lung apices are clear.

OTHER: None.
IMPRESSION: CT HEAD:

1. Normal noncontrast CT HEAD.
2. Moderate paranasal sinusitis.
CT CERVICAL SPINE:

1. Negative noncontrast CT cervical spine.

## 2019-10-02 IMAGING — CT CT ANGIO CHEST
3 of 7 series · 18 of 36 positions shown · IV contrast (Omni 300)
Comparison: Chest radiograph from 08/08/2005 was normal.

CLINICAL DATA: Multiple syncopal episodes over the past 1-2 weeks.
Ongoing shortness of breath. History of BILATERAL DVT.

EXAM:
CT ANGIOGRAPHY CHEST WITH CONTRAST
TECHNIQUE: Multidetector CT imaging of the chest was performed using the
standard protocol during bolus administration of intravenous
contrast. Multiplanar CT image reconstructions and MIPs were
obtained to evaluate the vascular anatomy.
CONTRAST:  100 mL Isovue 370.

[Series 6: pe lung · axial · 0.69mm/px · z∈[-434,-278]mm · 5 of 118 slices shown]
[im 20/118  mediastinal]
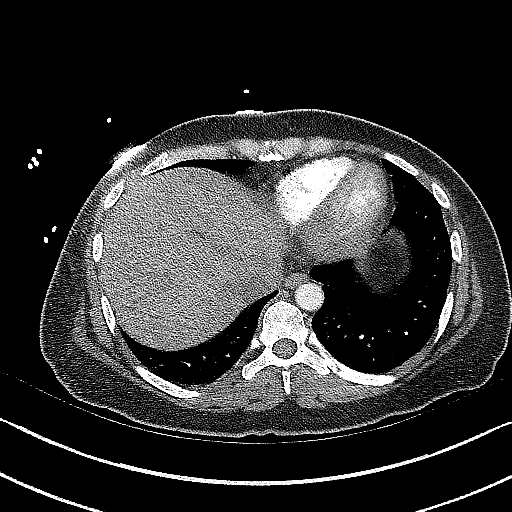
[im 40/118  mediastinal]
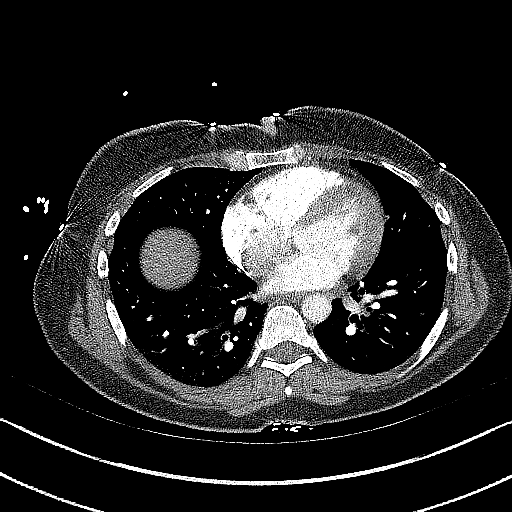
[im 59/118  mediastinal]
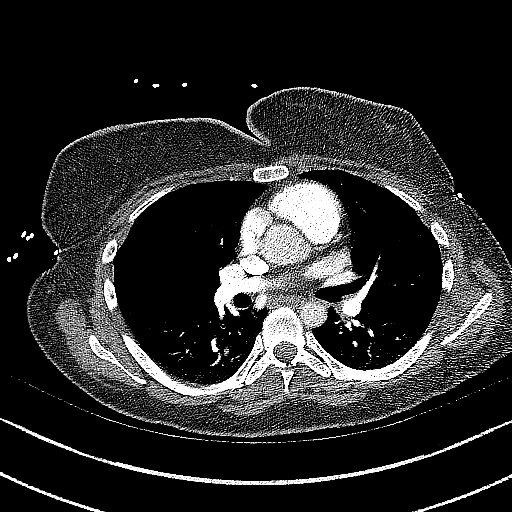
[im 79/118  mediastinal]
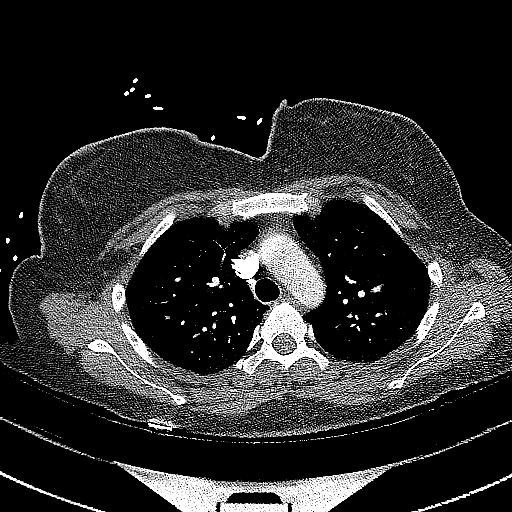
[im 98/118  mediastinal]
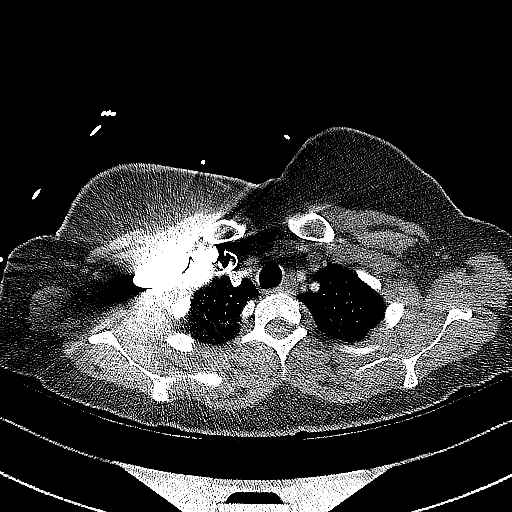

[Series 7: pe thins · axial · 0.69mm/px · z∈[-462,-258]mm · 12 of 243 slices shown]
[im 19/243  lung]
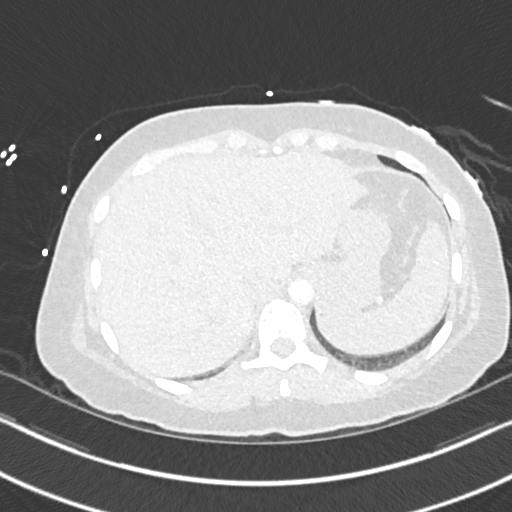
[im 38/243  mediastinal]
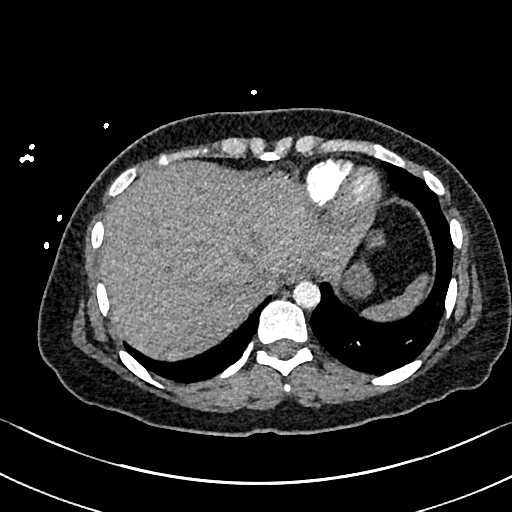
[im 56/243  lung]
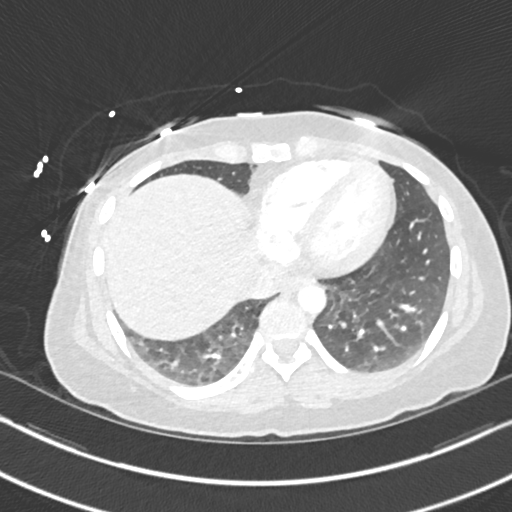
[im 75/243  mediastinal]
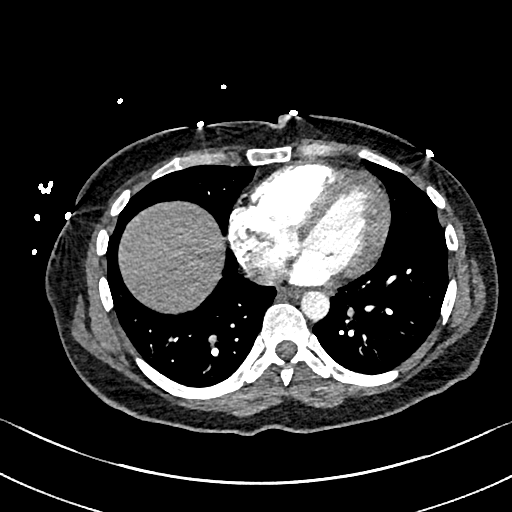
[im 94/243  lung]
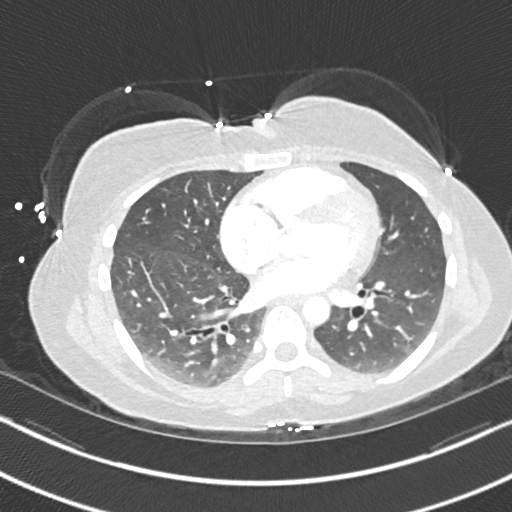
[im 112/243  mediastinal]
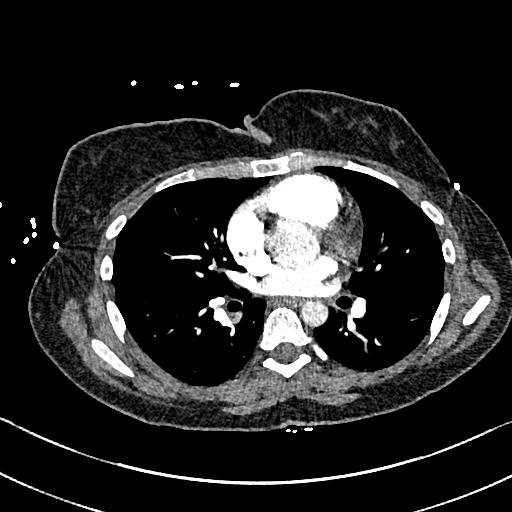
[im 131/243  lung]
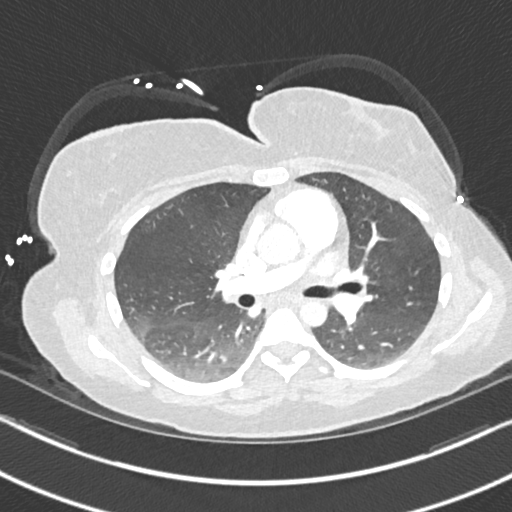
[im 149/243  mediastinal]
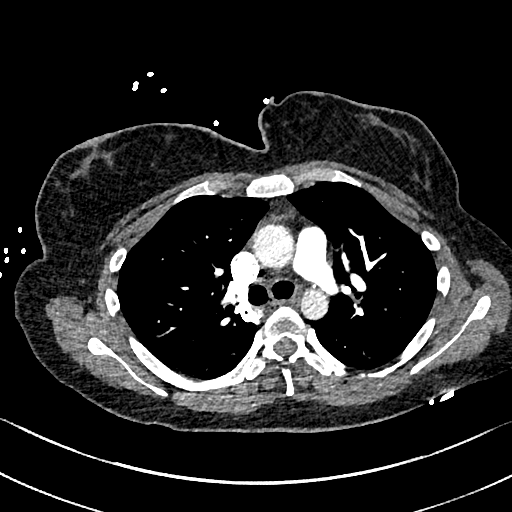
[im 168/243  lung]
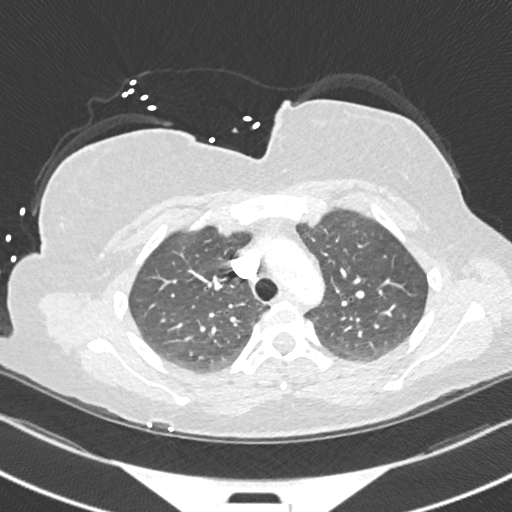
[im 187/243  mediastinal]
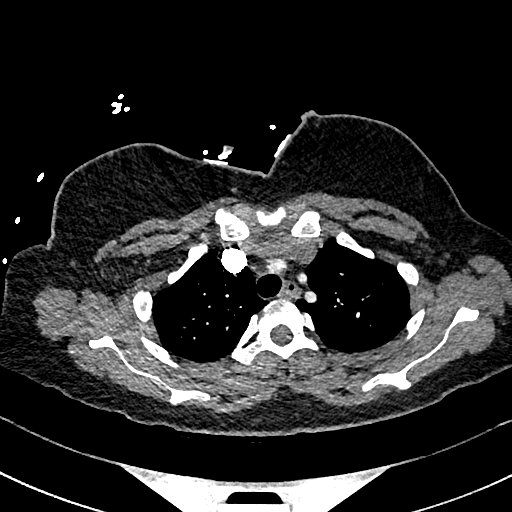
[im 205/243  lung]
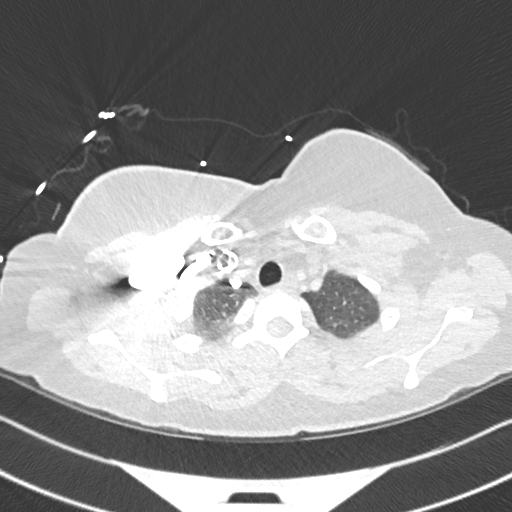
[im 224/243  mediastinal]
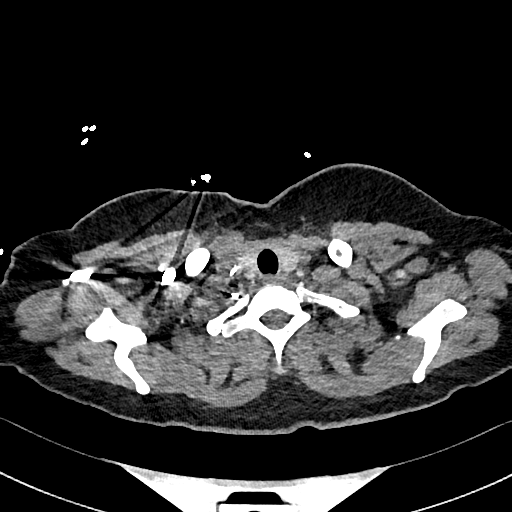

[Series 8: pe 2mm cor · coronal · 0.51mm/px · 1 of 151 slices shown]
[im 76/151  mediastinal]
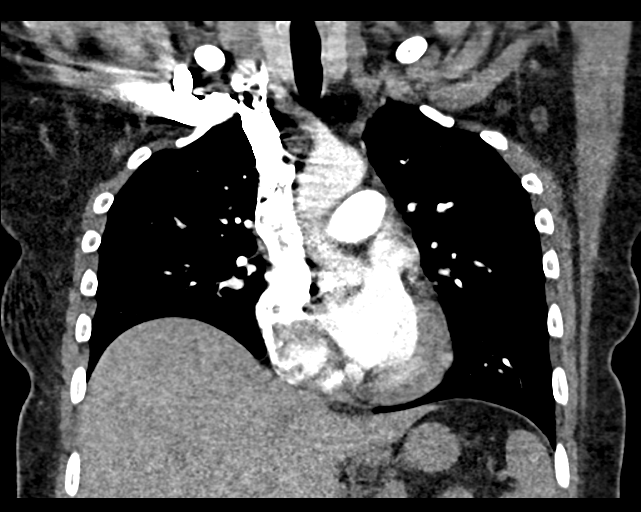

[18 of 36 positions shown; findings below may reference images not displayed]

FINDINGS: Cardiovascular: Satisfactory opacification of the pulmonary arteries
to the segmental level. No evidence of pulmonary embolism. Normal
heart size. No pericardial effusion.

Mediastinum/Nodes: No enlarged mediastinal, hilar, or axillary lymph
nodes. Thyroid gland, trachea, and esophagus demonstrate no
significant findings.

Lungs/Pleura: Lungs are clear. No pleural effusion or pneumothorax.

Upper Abdomen: Low attenuation liver consistent with steatosis.

Musculoskeletal: No chest wall abnormality. No acute or significant
osseous findings.

Review of the MIP images confirms the above findings.
IMPRESSION: Negative for pulmonary emboli. No acute process in the lungs is
observed.

Negative exam.

## 2019-12-14 ENCOUNTER — Encounter (HOSPITAL_COMMUNITY): Payer: Self-pay | Admitting: Emergency Medicine

## 2019-12-14 ENCOUNTER — Emergency Department (HOSPITAL_COMMUNITY)
Admission: EM | Admit: 2019-12-14 | Discharge: 2019-12-14 | Disposition: A | Discharge status: Home / Self Care | Payer: Medicaid Other | Attending: Emergency Medicine | Admitting: Emergency Medicine

## 2019-12-14 ENCOUNTER — Other Ambulatory Visit: Payer: Self-pay

## 2019-12-14 ENCOUNTER — Emergency Department (HOSPITAL_COMMUNITY): Payer: Medicaid Other

## 2019-12-14 DIAGNOSIS — F4321 Adjustment disorder with depressed mood: Secondary | ICD-10-CM | POA: Diagnosis not present

## 2019-12-14 DIAGNOSIS — M542 Cervicalgia: Secondary | ICD-10-CM | POA: Insufficient documentation

## 2019-12-14 DIAGNOSIS — F17210 Nicotine dependence, cigarettes, uncomplicated: Secondary | ICD-10-CM | POA: Diagnosis not present

## 2019-12-14 DIAGNOSIS — Z79899 Other long term (current) drug therapy: Secondary | ICD-10-CM | POA: Diagnosis not present

## 2019-12-14 MED ORDER — CYCLOBENZAPRINE HCL 10 MG PO TABS
10.0000 mg | ORAL_TABLET | Freq: Two times a day (BID) | ORAL | 0 refills | Status: DC | PRN
Start: 2019-12-14 — End: 2021-05-25

## 2019-12-14 MED ORDER — LORAZEPAM 1 MG PO TABS
1.0000 mg | ORAL_TABLET | Freq: Once | ORAL | Status: AC
  Administered 2019-12-14: 1 mg via ORAL
  Filled 2019-12-14: qty 1
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
## 2019-12-14 NOTE — Discharge Instructions (Addendum)
Please call your doctor for recheck next week. You may need further counseling and care given your recent loss

## 2019-12-23 ENCOUNTER — Other Ambulatory Visit: Payer: Self-pay

## 2019-12-23 ENCOUNTER — Emergency Department (HOSPITAL_COMMUNITY)
Admission: EM | Admit: 2019-12-23 | Discharge: 2019-12-23 | Disposition: A | Discharge status: Home / Self Care | Payer: Medicaid Other | Attending: Emergency Medicine | Admitting: Emergency Medicine

## 2019-12-23 ENCOUNTER — Emergency Department (HOSPITAL_COMMUNITY): Payer: Medicaid Other

## 2019-12-23 DIAGNOSIS — M79671 Pain in right foot: Secondary | ICD-10-CM | POA: Diagnosis present

## 2019-12-23 DIAGNOSIS — Z5321 Procedure and treatment not carried out due to patient leaving prior to being seen by health care provider: Secondary | ICD-10-CM | POA: Diagnosis not present
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
## 2020-07-12 ENCOUNTER — Emergency Department (HOSPITAL_COMMUNITY)
Admission: EM | Admit: 2020-07-12 | Discharge: 2020-07-12 | Disposition: A | Discharge status: Home / Self Care | Payer: Medicaid Other | Attending: Emergency Medicine | Admitting: Emergency Medicine

## 2020-07-12 ENCOUNTER — Other Ambulatory Visit: Payer: Self-pay

## 2020-07-12 ENCOUNTER — Emergency Department (HOSPITAL_COMMUNITY): Payer: Medicaid Other

## 2020-07-12 ENCOUNTER — Encounter (HOSPITAL_COMMUNITY): Payer: Self-pay | Admitting: Emergency Medicine

## 2020-07-12 DIAGNOSIS — R110 Nausea: Secondary | ICD-10-CM | POA: Insufficient documentation

## 2020-07-12 DIAGNOSIS — R197 Diarrhea, unspecified: Secondary | ICD-10-CM | POA: Insufficient documentation

## 2020-07-12 DIAGNOSIS — R300 Dysuria: Secondary | ICD-10-CM | POA: Insufficient documentation

## 2020-07-12 DIAGNOSIS — F17210 Nicotine dependence, cigarettes, uncomplicated: Secondary | ICD-10-CM | POA: Insufficient documentation

## 2020-07-12 DIAGNOSIS — Z8541 Personal history of malignant neoplasm of cervix uteri: Secondary | ICD-10-CM | POA: Diagnosis not present

## 2020-07-12 DIAGNOSIS — R1031 Right lower quadrant pain: Secondary | ICD-10-CM | POA: Diagnosis present

## 2020-07-12 DIAGNOSIS — R630 Anorexia: Secondary | ICD-10-CM | POA: Diagnosis not present

## 2020-07-12 LAB — URINALYSIS, ROUTINE W REFLEX MICROSCOPIC
Bacteria, UA: NONE SEEN
Bilirubin Urine: NEGATIVE
Glucose, UA: NEGATIVE mg/dL
Ketones, ur: NEGATIVE mg/dL
Nitrite: NEGATIVE
Protein, ur: NEGATIVE mg/dL
Specific Gravity, Urine: 1.008 (ref 1.005–1.030)
pH: 5 (ref 5.0–8.0)

## 2020-07-12 LAB — COMPREHENSIVE METABOLIC PANEL
ALT: 6 U/L (ref 0–44)
AST: 13 U/L — ABNORMAL LOW (ref 15–41)
Albumin: 3.8 g/dL (ref 3.5–5.0)
Alkaline Phosphatase: 59 U/L (ref 38–126)
Anion gap: 7 (ref 5–15)
BUN: 9 mg/dL (ref 6–20)
CO2: 23 mmol/L (ref 22–32)
Calcium: 9.3 mg/dL (ref 8.9–10.3)
Chloride: 108 mmol/L (ref 98–111)
Creatinine, Ser: 0.55 mg/dL (ref 0.44–1.00)
GFR, Estimated: >60 mL/min (ref >60)
Glucose, Bld: 88 mg/dL (ref 70–99)
Potassium: 3.7 mmol/L (ref 3.5–5.1)
Sodium: 138 mmol/L (ref 135–145)
Total Bilirubin: 0.6 mg/dL (ref 0.3–1.2)
Total Protein: 6.8 g/dL (ref 6.5–8.1)

## 2020-07-12 LAB — CBC
HCT: 39.1 % (ref 36.0–46.0)
Hemoglobin: 13.2 g/dL (ref 12.0–15.0)
MCH: 30.8 pg (ref 26.0–34.0)
MCHC: 33.8 g/dL (ref 30.0–36.0)
MCV: 91.4 fL (ref 80.0–100.0)
Platelets: 280 10*3/uL (ref 150–400)
RBC: 4.28 MIL/uL (ref 3.87–5.11)
RDW: 14.2 % (ref 11.5–15.5)
WBC: 10.8 10*3/uL — ABNORMAL HIGH (ref 4.0–10.5)
nRBC: 0 % (ref 0.0–0.2)

## 2020-07-12 LAB — LIPASE, BLOOD: Lipase: 24 U/L (ref 11–51)

## 2020-07-12 MED ORDER — FENTANYL CITRATE (PF) 100 MCG/2ML IJ SOLN
50.0000 ug | Freq: Once | INTRAMUSCULAR | Status: AC
  Administered 2020-07-12: 50 ug via INTRAVENOUS
  Filled 2020-07-12: qty 2

## 2020-07-12 MED ORDER — SODIUM CHLORIDE 0.9 % IV BOLUS
1000.0000 mL | Freq: Once | INTRAVENOUS | Status: AC
  Administered 2020-07-12: 1000 mL via INTRAVENOUS

## 2020-07-12 MED ORDER — ONDANSETRON HCL 4 MG/2ML IJ SOLN
4.0000 mg | Freq: Once | INTRAMUSCULAR | Status: AC
  Administered 2020-07-12: 4 mg via INTRAVENOUS
  Filled 2020-07-12: qty 2

## 2020-07-12 MED ORDER — KETOROLAC TROMETHAMINE 15 MG/ML IJ SOLN
15.0000 mg | Freq: Once | INTRAMUSCULAR | Status: AC
  Administered 2020-07-12: 15 mg via INTRAVENOUS
  Filled 2020-07-12: qty 1

## 2020-07-12 MED ORDER — IOHEXOL 300 MG/ML  SOLN
100.0000 mL | Freq: Once | INTRAMUSCULAR | Status: AC | PRN
  Administered 2020-07-12: 100 mL via INTRAVENOUS

## 2020-07-12 MED ORDER — ONDANSETRON 4 MG PO TBDP
4.0000 mg | ORAL_TABLET | Freq: Three times a day (TID) | ORAL | 0 refills | Status: DC | PRN
Start: 2020-07-12 — End: 2021-05-25

## 2020-07-12 NOTE — ED Provider Notes (Signed)
[facility] DEPT Provider Note   CSN: [id number] Arrival date & time: 07/12/20  0211     History Chief Complaint  Patient presents with  . Abdominal Pain    RLQ    Sonya King is a 40 y.o. female.  Hx hysterectomy, cholecystectomy.   The history is provided by the patient. No language interpreter was used.  Abdominal Pain Pain location:  RLQ Pain quality: sharp   Pain severity:  Moderate Onset quality:  Gradual Duration:  18 hours Timing:  Constant Progression:  Worsening Chronicity:  New Relieved by:  Nothing Ineffective treatments: ibuprofen. Associated symptoms: anorexia, diarrhea, dysuria and nausea   Associated symptoms: no fever, no hematuria, no vaginal bleeding, no vaginal discharge and no vomiting   Risk factors: obesity       Past Medical History:  Diagnosis Date  . Anxiety   . Anxiety   . Bipolar 1 disorder ([place])   . Cancer (HCC)    cervical  . Chronic abdominal pain   . Drug-seeking behavior   . Ruptured lumbar disc     Patient Active Problem List   Diagnosis Date Noted  . Cystitis, chronic 09/21/2016    Past Surgical History:  Procedure Laterality Date  . ABDOMINAL HYSTERECTOMY    . CHOLECYSTECTOMY    . TONSILLECTOMY       OB History    Gravida  4   Para  4   Term  4   Preterm  0   AB  0   Living  0     SAB  0   IAB  0   Ectopic  0   Multiple  0   Live Births  4           No family history on file.  Social History   Tobacco Use  . Smoking status: Current Every Day Smoker    Packs/day: 0.50    Types: Cigarettes  . Smokeless tobacco: Never Used  Substance Use Topics  . Alcohol use: No  . Drug use: No    Home Medications Prior to Admission medications   Medication Sig Start Date End Date Taking? Authorizing Provider  ondansetron (ZOFRAN ODT) 4 MG disintegrating tablet Take 1 tablet (4 mg total) by mouth every 8 (eight) hours as needed for nausea or vomiting. 07/12/20   Yes Antonietta Breach, PA-C  cephALEXin (KEFLEX) 500 MG capsule Take 2 capsules (1,000 mg total) by mouth 2 (two) times daily. Patient not taking: Reported on 12/14/2019 06/29/19   Lajean Saver, MD  clonazePAM (KLONOPIN) 1 MG tablet Take 3 mg by mouth daily. 06/21/19   [provider]  cyclobenzaprine (FLEXERIL) 10 MG tablet Take 1 tablet (10 mg total) by mouth 2 (two) times daily as needed for muscle spasms. 12/14/19   Pattricia Boss, MD  pantoprazole (PROTONIX) 40 MG tablet Take 1 tablet (40 mg total) by mouth daily. Patient not taking: Reported on 12/14/2019 06/29/19   Lajean Saver, MD  zolpidem (AMBIEN) 10 MG tablet Take 10 mg by mouth at bedtime.  06/21/19   [provider]    Allergies    Avocado and Benadryl [diphenhydramine hcl]  Review of Systems   Review of Systems  Constitutional: Negative for fever.  Gastrointestinal: Positive for abdominal pain, anorexia, diarrhea and nausea. Negative for vomiting.  Genitourinary: Positive for dysuria. Negative for hematuria, vaginal bleeding and vaginal discharge.  Ten systems reviewed and are negative for acute change, except as noted in the HPI.  Physical Exam Updated Vital Signs BP 122/70   Pulse 62   Temp 97.9 F (36.6 C) (Oral)   Resp 17   Ht 5\' 4"  (1.626 m)   Wt 83 kg   SpO2 97%   BMI 31.41 kg/m   Physical Exam Vitals and nursing note reviewed.  Constitutional:      General: She is not in acute distress.    Appearance: She is well-developed and well-nourished. She is not diaphoretic.     Comments: Nontoxic, though appears uncomfortable. Standing beside bed holding bedrail.   HENT:     Head: Normocephalic and atraumatic.  Eyes:     General: No scleral icterus.    Extraocular Movements: EOM normal.     Conjunctiva/sclera: Conjunctivae normal.  Cardiovascular:     Rate and Rhythm: Normal rate and regular rhythm.     Pulses: Normal pulses.  Pulmonary:     Effort: Pulmonary effort is normal. No respiratory  distress.     Breath sounds: No stridor. No wheezing or rales.     Comments: Respirations even and unlabored. Lungs CTAB. Abdominal:     Palpations: Abdomen is soft. There is no mass.     Tenderness: There is abdominal tenderness. There is no guarding.     Comments: TTP in the R mild and lower abdomen without guarding or peritoneal signs. +CVA TTP on the right.  Musculoskeletal:        General: Normal range of motion.     Cervical back: Normal range of motion.  Skin:    General: Skin is warm and dry.     Coloration: Skin is not pale.     Findings: No erythema or rash.  Neurological:     Mental Status: She is alert and oriented to person, place, and time.     Coordination: Coordination normal.  Psychiatric:        Mood and Affect: Mood and affect normal.        Behavior: Behavior normal.     ED Results / Procedures / Treatments   Labs (all labs ordered are listed, but only abnormal results are displayed) Labs Reviewed  COMPREHENSIVE METABOLIC PANEL - Abnormal; Notable for the following components:      Result Value   AST 13 (*)    All other components within normal limits  CBC - Abnormal; Notable for the following components:   WBC 10.8 (*)    All other components within normal limits  URINALYSIS, ROUTINE W REFLEX MICROSCOPIC - Abnormal; Notable for the following components:   Hgb urine dipstick SMALL (*)    Leukocytes,Ua MODERATE (*)    All other components within normal limits  LIPASE, BLOOD    EKG None  Radiology CT ABDOMEN PELVIS W CONTRAST  Result Date: 07/12/2020 CLINICAL DATA:  Right lower quadrant pain EXAM: CT ABDOMEN AND PELVIS WITH CONTRAST TECHNIQUE: Multidetector CT imaging of the abdomen and pelvis was performed using the standard protocol following bolus administration of intravenous contrast. CONTRAST:  130mL OMNIPAQUE IOHEXOL 300 MG/ML  SOLN COMPARISON:  06/28/2019 FINDINGS: Lower chest:  No contributory findings. Hepatobiliary: No focal liver  abnormality.Cholecystectomy which accounts for mild common bile duct dilatation which is chronic. Pancreas: Unremarkable. Spleen: Unremarkable. Adrenals/Urinary Tract: Negative adrenals. No hydronephrosis or stone. Unremarkable bladder. Stomach/Bowel: No obstruction. No appendicitis or other visible bowel inflammation. Formed stool seen throughout the colon without rectal impaction. Vascular/Lymphatic: No acute vascular abnormality. No mass or adenopathy. Reproductive:Hysterectomy Other: No ascites or pneumoperitoneum. Musculoskeletal: No acute abnormalities. IMPRESSION:  1. No acute finding, including appendicitis. 2. Generalized colonic stool. Electronically Signed   By: Monte Fantasia M.D.   On: 07/12/2020 04:11    Procedures Procedures   Medications Ordered in ED Medications  ketorolac (TORADOL) 15 MG/ML injection 15 mg (has no administration in time range)  fentaNYL (SUBLIMAZE) injection 50 mcg (50 mcg Intravenous Given 07/12/20 0315)  ondansetron (ZOFRAN) injection 4 mg (4 mg Intravenous Given 07/12/20 0315)  sodium chloride 0.9 % bolus 1,000 mL (1,000 mLs Intravenous New Bag/Given 07/12/20 0315)  iohexol (OMNIPAQUE) 300 MG/ML solution 100 mL (100 mLs Intravenous Contrast Given 07/12/20 0347)    ED Course  I have reviewed the triage vital signs and the nursing notes.  Pertinent labs & imaging results that were available during my care of the patient were reviewed by me and considered in my medical decision making (see chart for details).  Clinical Course as of 07/12/20 0456  Sat Jul 12, 2020  7035 CT negative for appendicitis or other acute intraabdominal process. Will PO challenge. [KH]  0416 Tolerating PO fluids. Requested a sandwich.  [KH]    Clinical Course User Index [KH] Antonietta Breach, PA-C   MDM Rules/Calculators/A&P                          40 y/o nontoxic appearing female with RLQ pain with associated nausea, diarrhea. Onset at 0900 yesterday. Suspect viral etiology given work  up, negative CT scan. She is feeling better after antiemetics, IVF, pain medication. Tolerating PO fluids and requesting a sandwich. Will d/c with Zofran course. Encouraged PCP follow up. Return precautions discussed and provided. Patient discharged in stable condition with no unaddressed concerns.   Final Clinical Impression(s) / ED Diagnoses Final diagnoses:  Right lower quadrant abdominal pain    Rx / DC Orders ED Discharge Orders         Ordered    ondansetron (ZOFRAN ODT) 4 MG disintegrating tablet  Every 8 hours PRN        07/12/20 0448           Antonietta Breach, PA-C 07/12/20 0456    Merryl Hacker, MD 07/12/20 [phone]

## 2020-07-12 NOTE — ED Triage Notes (Addendum)
Patient presents from home BIB EMS complaining of RLQ pain that began this morning and radiates into her right flank. Patient with hx of chronic abd (RLQ) and pelvic pain with associated diarrhea. Patient also complains of dysuria, hx hysterectomy.

## 2020-07-12 NOTE — ED Notes (Signed)
Patient transported to CT 

## 2020-09-09 NOTE — Congregational Nurse Program (Signed)
  Dept: [facility] Nurse Program Note  Date of Encounter: 09/09/2020  Past Medical History: Past Medical History:  Diagnosis Date  . Anxiety   . Anxiety   . Bipolar 1 disorder ([place])   . Cancer (HCC)    cervical  . Chronic abdominal pain   . Drug-seeking behavior   . Ruptured lumbar disc     Encounter Details:  CNP Questionnaire - 09/03/20 1400      Questionnaire   Do you give verbal consent to treat you today? Yes    Visit Setting Other    Location Patient Served At [facility]    Patient Status Homeless    Medical Provider Yes    Insurance Medicaid    Intervention Counsel;Educate;Refer;Support    Housing/Utilities No permanent housing    Transportation Need transportation assistance    Referrals Other;Medicaid         Initial visit with this mother of 3 ,2 girls and 1  boy ages 8,9,14. Mother needing assistance with locating a PC provider for children as the previous one she doesn't know what happened . Information given to  mother to complete application for Pediatric care at [facility] .,will need insurance card and application completed before an appointment can be scheduled States she suffers from depression and anxiety nurse recommended a support group at the [facility] .client receptive and information given.Has been at the center for 2 weeks ,was just coming out of a bad relationship.no car ,depends on public transportation ,moved from [place], mother passed  June 2021 ,has 1 sister ? Relationship issues Mother is to complete application ,call [facility] and get PCP for children changed , get ID's and nurse can assist with getting applications in . Mother in agreement. Will follow up next week

## 2020-09-10 NOTE — Congregational Nurse Program (Signed)
  Dept: [facility] Nurse Program Note  Date of Encounter: 09/10/2020  Past Medical History: Past Medical History:  Diagnosis Date  . Anxiety   . Anxiety   . Bipolar 1 disorder ([place])   . Cancer (HCC)    cervical  . Chronic abdominal pain   . Drug-seeking behavior   . Ruptured lumbar disc     Encounter Details:  CNP Questionnaire - 09/10/20 1715      Questionnaire   Do you give verbal consent to treat you today? Yes    Visit Setting Other    Location Patient Served At [facility]    Patient Status Homeless    Medical Provider Yes    Intervention Counsel;Educate;Refer;Support    Housing/Utilities No permanent housing    Transportation Need transportation assistance    Referrals PCP - other provider         Follow up with mother to complete applications for Childrens appointments. States she is adjusting well to [facility],things going okay. Will complete applications and turn them in herself.  Nurse will follow up laterand keep in contact.

## 2020-09-10 NOTE — Congregational Nurse Program (Signed)
  Dept: [facility] Nurse Program Note  Date of Encounter: 09/09/2020  Past Medical History: Past Medical History:  Diagnosis Date  . Anxiety   . Anxiety   . Bipolar 1 disorder ([place])   . Cancer (HCC)    cervical  . Chronic abdominal pain   . Drug-seeking behavior   . Ruptured lumbar disc     Encounter Details:  CNP Questionnaire - 09/03/20 1400      Questionnaire   Do you give verbal consent to treat you today? Yes    Visit Setting Other    Location Patient Served At [facility]    Patient Status Homeless    Medical Provider Yes    Insurance Medicaid    Intervention Counsel;Educate;Refer;Support    Housing/Utilities No permanent housing    Transportation Need transportation assistance    Referrals Other;Medicaid         Initial visit with this mother of 3 2 girls and a boy ages 35,9,14. Mother needing assistance with locating a PC providers for children as the previous one she doesn't know what happened . Information given o mother to complete application for Pediatric care at [facility] .Mother in agreement to complete and return next week for follow up. Mother talked little about self and her needs focusing on the needs of the children right now. Nurse was support and explained her role and dates on site. Follow up next week .

## 2020-09-16 NOTE — Congregational Nurse Program (Signed)
  Dept: [facility] Nurse Program Note  Date of Encounter: 09/16/2020  Past Medical History: Past Medical History:  Diagnosis Date  . Anxiety   . Anxiety   . Bipolar 1 disorder ([place])   . Cancer (HCC)    cervical  . Chronic abdominal pain   . Drug-seeking behavior   . Ruptured lumbar disc     Encounter Details:  CNP Questionnaire - 09/16/20 1332      Questionnaire   Do you give verbal consent to treat you today? Yes    Visit Setting Other    Location Patient Served At [facility]    Patient Status Homeless    Medical Provider Yes    Insurance Medicaid    Intervention Counsel;Educate;Refer;Support    Housing/Utilities No permanent housing    Transportation Need transportation assistance    Referrals PCP - other provider         brief  Encounter with client in hallway ,states applications  For children's care PCP completed and turned in ,received a call to schedule them on yesterday will work on that. States things are going well  Information placed in box again on resources and workshops at [employer] for  Client to consider . Follow as needed ,offer support .

## 2020-09-27 ENCOUNTER — Encounter (HOSPITAL_COMMUNITY): Payer: Self-pay

## 2020-09-27 ENCOUNTER — Emergency Department (HOSPITAL_COMMUNITY)
Admission: EM | Admit: 2020-09-27 | Discharge: 2020-09-27 | Disposition: A | Discharge status: Home / Self Care | Payer: Medicaid Other | Attending: Emergency Medicine | Admitting: Emergency Medicine

## 2020-09-27 ENCOUNTER — Other Ambulatory Visit: Payer: Self-pay

## 2020-09-27 ENCOUNTER — Emergency Department (HOSPITAL_COMMUNITY): Payer: Medicaid Other

## 2020-09-27 DIAGNOSIS — F17210 Nicotine dependence, cigarettes, uncomplicated: Secondary | ICD-10-CM | POA: Insufficient documentation

## 2020-09-27 DIAGNOSIS — Z8541 Personal history of malignant neoplasm of cervix uteri: Secondary | ICD-10-CM | POA: Diagnosis not present

## 2020-09-27 DIAGNOSIS — Y9289 Other specified places as the place of occurrence of the external cause: Secondary | ICD-10-CM | POA: Insufficient documentation

## 2020-09-27 DIAGNOSIS — W010XXA Fall on same level from slipping, tripping and stumbling without subsequent striking against object, initial encounter: Secondary | ICD-10-CM | POA: Insufficient documentation

## 2020-09-27 DIAGNOSIS — M25571 Pain in right ankle and joints of right foot: Secondary | ICD-10-CM | POA: Diagnosis not present

## 2020-09-27 MED ORDER — NAPROXEN 500 MG PO TABS
500.0000 mg | ORAL_TABLET | Freq: Two times a day (BID) | ORAL | 0 refills | Status: DC
Start: 2020-09-27 — End: 2020-09-28

## 2020-09-27 MED ORDER — KETOROLAC TROMETHAMINE 30 MG/ML IJ SOLN
30.0000 mg | Freq: Once | INTRAMUSCULAR | Status: AC
  Administered 2020-09-27: 30 mg via INTRAMUSCULAR
  Filled 2020-09-27: qty 1

## 2020-09-27 MED ORDER — OXYCODONE-ACETAMINOPHEN 5-325 MG PO TABS: 1.0000 | ORAL_TABLET | Freq: Once | ORAL | Status: DC

## 2020-09-27 NOTE — ED Provider Notes (Addendum)
[facility] DEPT Provider Note   CSN: [id number] Arrival date & time: 09/27/20  1918     History Chief Complaint  Patient presents with  . Ankle Injury    Sonya King is a 40 y.o. female.  40 y.o female with a PMH of anxiety, bipolar presents to the ED via EMS status post mechanical fall.  Patient reports she was collecting clothing yesterday at the [employer], when she suddenly had the bag of clothing fall forward off a cliff, she then fell stepping on her right foot.  She reports her right foot inverted in.  Causing pain along the lateral aspect and dorsum aspect of her foot.  This is exacerbated with ambulation.  She has taken some Tylenol to help with symptomatic control without much improvement in her symptoms.  Denies any prior injury to her right foot.  No other injuries or complaints.  The history is provided by the patient.  Ankle Injury This is a new problem. The current episode started yesterday. The problem occurs constantly. The problem has not changed since onset.Pertinent negatives include no chest pain, no abdominal pain, no headaches and no shortness of breath. The symptoms are aggravated by walking. Nothing relieves the symptoms. She has tried acetaminophen for the symptoms.       Past Medical History:  Diagnosis Date  . Anxiety   . Anxiety   . Bipolar 1 disorder ([place])   . Cancer (HCC)    cervical  . Chronic abdominal pain   . Drug-seeking behavior   . Ruptured lumbar disc     Patient Active Problem List   Diagnosis Date Noted  . Cystitis, chronic 09/21/2016    Past Surgical History:  Procedure Laterality Date  . ABDOMINAL HYSTERECTOMY    . CHOLECYSTECTOMY    . TONSILLECTOMY       OB History    Gravida  4   Para  4   Term  4   Preterm  0   AB  0   Living  0     SAB  0   IAB  0   Ectopic  0   Multiple  0   Live Births  4           No family history on file.  Social History    Tobacco Use  . Smoking status: Current Every Day Smoker    Packs/day: 0.50    Types: Cigarettes  . Smokeless tobacco: Never Used  Substance Use Topics  . Alcohol use: No  . Drug use: No    Home Medications Prior to Admission medications   Medication Sig Start Date End Date Taking? Authorizing Provider  naproxen (NAPROSYN) 500 MG tablet Take 1 tablet (500 mg total) by mouth 2 (two) times daily for 7 days. 09/27/20 10/04/20 Yes Beryl Hornberger, PA-C  cephALEXin (KEFLEX) 500 MG capsule Take 2 capsules (1,000 mg total) by mouth 2 (two) times daily. Patient not taking: Reported on 12/14/2019 06/29/19   Lajean Saver, MD  clonazePAM (KLONOPIN) 1 MG tablet Take 3 mg by mouth daily. 06/21/19   [provider]  cyclobenzaprine (FLEXERIL) 10 MG tablet Take 1 tablet (10 mg total) by mouth 2 (two) times daily as needed for muscle spasms. 12/14/19   Pattricia Boss, MD  ondansetron (ZOFRAN ODT) 4 MG disintegrating tablet Take 1 tablet (4 mg total) by mouth every 8 (eight) hours as needed for nausea or vomiting. 07/12/20   Antonietta Breach, PA-C  pantoprazole ([place])  40 MG tablet Take 1 tablet (40 mg total) by mouth daily. Patient not taking: Reported on 12/14/2019 06/29/19   Lajean Saver, MD  zolpidem (AMBIEN) 10 MG tablet Take 10 mg by mouth at bedtime.  06/21/19   [provider]    Allergies    Avocado and Benadryl [diphenhydramine hcl]  Review of Systems   Review of Systems  Constitutional: Negative for fever.  Respiratory: Negative for shortness of breath.   Cardiovascular: Negative for chest pain.  Gastrointestinal: Negative for abdominal pain, nausea and vomiting.  Genitourinary: Negative for flank pain.  Musculoskeletal: Positive for arthralgias.  Skin: Negative for pallor and wound.  Neurological: Negative for light-headedness and headaches.  All other systems reviewed and are negative.   Physical Exam Updated Vital Signs BP (!) 94/59   Pulse 81   Temp 98.7 F (37.1 C)  (Oral)   Resp 16   Ht 5\' 4"  (1.626 m)   Wt 83 kg   SpO2 95%   BMI 31.41 kg/m   Physical Exam Vitals and nursing note reviewed.  Constitutional:      Appearance: Normal appearance.  HENT:     Head: Normocephalic and atraumatic.     Nose: Nose normal.  Eyes:     Pupils: Pupils are equal, round, and reactive to light.  Cardiovascular:     Rate and Rhythm: Normal rate.  Pulmonary:     Effort: Pulmonary effort is normal.  Abdominal:     General: Abdomen is flat.  Musculoskeletal:        General: Swelling and tenderness present.     Cervical back: Normal range of motion and neck supple.     Right foot: Decreased range of motion. Normal capillary refill. Swelling, tenderness and bony tenderness present. No deformity or bunion. Normal pulse.     Comments: Pulses present, limited ROM due to pain, swelling noted to the dorsum aspect of the foot. Sensation is intact throughout.   Skin:    General: Skin is warm.  Neurological:     Mental Status: She is alert and oriented to person, place, and time.     ED Results / Procedures / Treatments   Labs (all labs ordered are listed, but only abnormal results are displayed) Labs Reviewed - No data to display  EKG None  Radiology DG Ankle Complete Right  Result Date: 09/27/2020 CLINICAL DATA:  Right ankle pain after slip and fall injury yesterday. EXAM: RIGHT ANKLE - COMPLETE 3+ VIEW; RIGHT FOOT COMPLETE - 3+ VIEW COMPARISON:  Right foot 12/23/2019 FINDINGS: Three views of the right foot and three views of the right ankle are obtained. No evidence of acute fracture or dislocation of the right ankle. In the base of the right fifth metatarsal bone, there is a focal cortical irregularity which is best seen on the ankle views. There is overlying soft tissue swelling suggesting that this is likely an avulsion fracture. Otherwise, no acute displaced fractures are identified. No focal bone lesion or bone destruction. Joint spaces are normal.  IMPRESSION: Probable avulsion fracture at the base of the right fifth metatarsal bone with overlying soft tissue swelling. Electronically Signed   By: Lucienne Capers M.D.   On: 09/27/2020 20:15   DG Foot Complete Right  Result Date: 09/27/2020 CLINICAL DATA:  Right ankle pain after slip and fall injury yesterday. EXAM: RIGHT ANKLE - COMPLETE 3+ VIEW; RIGHT FOOT COMPLETE - 3+ VIEW COMPARISON:  Right foot 12/23/2019 FINDINGS: Three views of the right foot and three  views of the right ankle are obtained. No evidence of acute fracture or dislocation of the right ankle. In the base of the right fifth metatarsal bone, there is a focal cortical irregularity which is best seen on the ankle views. There is overlying soft tissue swelling suggesting that this is likely an avulsion fracture. Otherwise, no acute displaced fractures are identified. No focal bone lesion or bone destruction. Joint spaces are normal. IMPRESSION: Probable avulsion fracture at the base of the right fifth metatarsal bone with overlying soft tissue swelling. Electronically Signed   By: Lucienne Capers M.D.   On: 09/27/2020 20:15    Procedures Procedures   Medications Ordered in ED Medications  ketorolac (TORADOL) 30 MG/ML injection 30 mg (30 mg Intramuscular Given 09/27/20 2055)    ED Course  I have reviewed the triage vital signs and the nursing notes.  Pertinent labs & imaging results that were available during my care of the patient were reviewed by me and considered in my medical decision making (see chart for details).    MDM Rules/Calculators/A&P                          Patient presents to the ED status post mechanical fall yesterday after collecting lows at [employer].  Reports falling off a "cliff ", states there is pain to the dorsum aspect along with lateral aspect of her right foot, swelling present on my exam.  During evaluation patient has limited range of motion with foot extension and flexion due to pain,  dorsum aspect is swollen as well.  Some discoloration and bruising noted to the area.  Pain with palpation of the lateral malleolus.  Prior surgical intervention of her right ankle.  X-rays of her right ankle have been ordered. X-ray of the right foot showed: Probable avulsion fracture at the base of the right fifth metatarsal  bone with overlying soft tissue swelling.   Patient was placed on a postop shoe.  We discussed follow-up with orthopedics.  She is currently on Suboxone according to [place], was given Toradol in the ED.  9:06 PM patient was advised that she will not be receiving narcotics on today's visit as she is currently on Suboxone, she reports this is "my last dose of Suboxone", I stated to patient that unfortunately unable to grant for narcotic prescription but she will go home on naproxen to help with symptomatic control. Patient hemodynamically stable for discharge.    9:10 PMWhile printing patient discharge papers, I was informed by nursing staff that patient had eloped from the emergency department without her prescription or her discharge papers.  Patient has eloped.    Portions of this note were generated with [employer]. Dictation errors may occur despite best attempts at proofreading.  Final Clinical Impression(s) / ED Diagnoses Final diagnoses:  Acute right ankle pain    Rx / DC Orders ED Discharge Orders         Ordered    naproxen (NAPROSYN) 500 MG tablet  2 times daily        09/27/20 2108           Janeece Fitting, PA-C 09/27/20 2109    Janeece Fitting, PA-C 09/27/20 2110    Arnaldo Natal, MD 09/27/20 [phone]

## 2020-09-27 NOTE — Discharge Instructions (Addendum)
X-ray of your foot showed a avulsion fracture of the right fifth metatarsal.  We placed your foot in a postop shoe.  The number to Dr. Edmonia Lynch is attached to your chart, please schedule an appointment in order to obtain further follow-up for your foot fracture.  I have prescribed a short course of anti-inflammatories to help with your pain, please take these with food as prescribed.

## 2020-09-27 NOTE — ED Triage Notes (Signed)
Pt to ED by EMS from [facility] with c/o R ankle pain. Pt stepped off a curb yesterday and her shopping cart rolled back over her foot. Swelling and redness noted. Arrives A+O, VSS, NADN.

## 2020-09-28 ENCOUNTER — Ambulatory Visit (INDEPENDENT_AMBULATORY_CARE_PROVIDER_SITE_OTHER): Payer: Medicaid Other

## 2020-09-28 ENCOUNTER — Encounter (HOSPITAL_COMMUNITY): Payer: Self-pay

## 2020-09-28 ENCOUNTER — Other Ambulatory Visit: Payer: Self-pay

## 2020-09-28 ENCOUNTER — Ambulatory Visit (HOSPITAL_COMMUNITY)
Admission: EM | Admit: 2020-09-28 | Discharge: 2020-09-28 | Disposition: A | Discharge status: Home / Self Care | Payer: Medicaid Other | Attending: Physician Assistant | Admitting: Physician Assistant

## 2020-09-28 DIAGNOSIS — W19XXXA Unspecified fall, initial encounter: Secondary | ICD-10-CM

## 2020-09-28 DIAGNOSIS — M25512 Pain in left shoulder: Secondary | ICD-10-CM

## 2020-09-28 DIAGNOSIS — M542 Cervicalgia: Secondary | ICD-10-CM | POA: Diagnosis not present

## 2020-09-28 MED ORDER — NAPROXEN 500 MG PO TABS: 500.0000 mg | ORAL_TABLET | Freq: Two times a day (BID) | ORAL | 0 refills | Status: AC

## 2020-09-28 NOTE — Discharge Instructions (Signed)
Take Naprosyn twice daily.  Do not take additional NSAIDs including aspirin, ibuprofen/Advil, naproxen/Aleve with this.  Your x-rays were normal which is great news.  Follow-up with podiatry for your foot.

## 2020-09-28 NOTE — ED Triage Notes (Signed)
Pt in with c/o right foot injury and left shoulder pain that happened 2 days ago  Pt states she fell off a curb and fractured her right foot and the shopping cart fell on her back  Pt has been taking tylenol and ibuprofen with no relief

## 2020-09-28 NOTE — ED Provider Notes (Signed)
[facility]    CSN: [id number] Arrival date & time: 09/28/20  1417      History   Chief Complaint Chief Complaint  Patient presents with  . Fall  . Foot Injury  . Back Pain  . Shoulder Injury    HPI Sonya King is a 40 y.o. female.   Patient presents today with several day history of left shoulder and cervical neck pain following injury.  Patient reports that she was pushing a cart full of clothing when she fell off a large curb injuring her right foot and landing on her left side with a cart falling on top of her.  She was seen at the [facility] emergency room yesterday (09/27/2020) at which point x-ray showed possible avulsion fraction of fifth metatarsal patient was placed in postop shoe.  She reports that neck and shoulder pain were not addressed.  She was given Toradol during her visit but not prescribe narcotics she has a current prescription for Suboxone.  Patient reports pain has continued and is interfering with her ability perform daily activities.  Pain is rated 10 on a 0-10 pain scale, localized to left posterior shoulder with radiation into neck and throughout back, described as aching periodic sharp pains, worse with palpation or certain activities, no alleviating factors identified.  She reports decreased range of motion particularly with internal and external rotation and overhead flexion secondary to pain.  She is right-handed.  Reports she did not hit her head or have any loss of consciousness with this fall.     Past Medical History:  Diagnosis Date  . Anxiety   . Anxiety   . Bipolar 1 disorder ([place])   . Cancer (HCC)    cervical  . Chronic abdominal pain   . Drug-seeking behavior   . Ruptured lumbar disc     Patient Active Problem List   Diagnosis Date Noted  . Cystitis, chronic 09/21/2016    Past Surgical History:  Procedure Laterality Date  . ABDOMINAL HYSTERECTOMY    . CHOLECYSTECTOMY    . TONSILLECTOMY      OB History     Gravida  4   Para  4   Term  4   Preterm  0   AB  0   Living  0     SAB  0   IAB  0   Ectopic  0   Multiple  0   Live Births  4            Home Medications    Prior to Admission medications   Medication Sig Start Date End Date Taking? Authorizing Provider  cephALEXin (KEFLEX) 500 MG capsule Take 2 capsules (1,000 mg total) by mouth 2 (two) times daily. Patient not taking: Reported on 12/14/2019 06/29/19   Lajean Saver, MD  clonazePAM (KLONOPIN) 1 MG tablet Take 3 mg by mouth daily. 06/21/19   [provider]  cyclobenzaprine (FLEXERIL) 10 MG tablet Take 1 tablet (10 mg total) by mouth 2 (two) times daily as needed for muscle spasms. 12/14/19   Pattricia Boss, MD  naproxen (NAPROSYN) 500 MG tablet Take 1 tablet (500 mg total) by mouth 2 (two) times daily for 7 days. 09/28/20 10/05/20  Garnell Begeman, Derry Skill, PA-C  ondansetron (ZOFRAN ODT) 4 MG disintegrating tablet Take 1 tablet (4 mg total) by mouth every 8 (eight) hours as needed for nausea or vomiting. 07/12/20   Antonietta Breach, PA-C  pantoprazole (PROTONIX) 40 MG tablet Take 1 tablet (  40 mg total) by mouth daily. Patient not taking: Reported on 12/14/2019 06/29/19   Lajean Saver, MD  zolpidem (AMBIEN) 10 MG tablet Take 10 mg by mouth at bedtime.  06/21/19   [provider]    Family History History reviewed. No pertinent family history.  Social History Social History   Tobacco Use  . Smoking status: Current Every Day Smoker    Packs/day: 0.50    Types: Cigarettes  . Smokeless tobacco: Never Used  Substance Use Topics  . Alcohol use: No  . Drug use: No     Allergies   Avocado and Benadryl [diphenhydramine hcl]   Review of Systems Review of Systems  Constitutional: Positive for activity change. Negative for appetite change, fatigue and fever.  Eyes: Negative for photophobia and visual disturbance.  Respiratory: Negative for cough and shortness of breath.   Cardiovascular: Negative for chest pain.   Gastrointestinal: Negative for abdominal pain, diarrhea, nausea and vomiting.  Musculoskeletal: Positive for arthralgias, back pain, myalgias and neck pain.  Neurological: Negative for dizziness, light-headedness and headaches.     Physical Exam Triage Vital Signs ED Triage Vitals  Enc Vitals Group     BP 09/28/20 1449 95/65     Pulse Rate 09/28/20 1449 80     Resp 09/28/20 1449 17     Temp 09/28/20 1449 98.2 F (36.8 C)     Temp src --      SpO2 09/28/20 1449 95 %     Weight --      Height --      Head Circumference --      Peak Flow --      Pain Score 09/28/20 1447 10     Pain Loc --      Pain Edu? --      Excl. in [place]? --    No data found.  Updated Vital Signs BP 95/65   Pulse 80   Temp 98.2 F (36.8 C)   Resp 17   SpO2 95%   Visual Acuity Right Eye Distance:   Left Eye Distance:   Bilateral Distance:    Right Eye Near:   Left Eye Near:    Bilateral Near:     Physical Exam Vitals reviewed.  Constitutional:      General: She is awake. She is not in acute distress.    Appearance: Normal appearance. She is not ill-appearing.     Comments: Pleasant female appears stated age in no acute distress  HENT:     Head: Normocephalic and atraumatic.  Cardiovascular:     Rate and Rhythm: Normal rate and regular rhythm.     Heart sounds: No murmur heard.   Pulmonary:     Effort: Pulmonary effort is normal.     Breath sounds: Normal breath sounds. No wheezing, rhonchi or rales.     Comments: Clear to auscultation bilaterally Abdominal:     Palpations: Abdomen is soft.     Tenderness: There is no abdominal tenderness.  Musculoskeletal:     Left shoulder: Tenderness present. No swelling, deformity or bony tenderness. Decreased range of motion. Normal strength.     Cervical back: Tenderness and bony tenderness present. Pain with movement present.     Thoracic back: Tenderness present. No spasms or bony tenderness.     Lumbar back: Tenderness present. No bony  tenderness.     Comments: Left shoulder: Tenderness palpation over scapular spine and AC joint.  No deformity noted.  Decreased range of motion  with internal and external rotation and overhead flexion.  Negative drop arm and empty can.  Patient unable to perform Apley scratch.  Neck: Pain on percussion of vertebrae.  Tenderness palpation along trapezius bilaterally.  Feet:     Comments: Right foot in postop boot. Psychiatric:        Behavior: Behavior is cooperative.      UC Treatments / Results  Labs (all labs ordered are listed, but only abnormal results are displayed) Labs Reviewed - No data to display  EKG   Radiology DG Cervical Spine 2-3 Views  Result Date: 09/28/2020 CLINICAL DATA:  Fall with neck pain. EXAM: CERVICAL SPINE - 2-3 VIEW COMPARISON:  Cervical spine radiographs dated 12/14/2019. FINDINGS: There is no evidence of cervical spine fracture or prevertebral soft tissue swelling. Alignment is normal. No other significant bone abnormalities are identified. IMPRESSION: Negative cervical spine radiographs. Electronically Signed   By: Zerita Boers M.D.   On: 09/28/2020 16:03   DG Ankle Complete Right  Result Date: 09/27/2020 CLINICAL DATA:  Right ankle pain after slip and fall injury yesterday. EXAM: RIGHT ANKLE - COMPLETE 3+ VIEW; RIGHT FOOT COMPLETE - 3+ VIEW COMPARISON:  Right foot 12/23/2019 FINDINGS: Three views of the right foot and three views of the right ankle are obtained. No evidence of acute fracture or dislocation of the right ankle. In the base of the right fifth metatarsal bone, there is a focal cortical irregularity which is best seen on the ankle views. There is overlying soft tissue swelling suggesting that this is likely an avulsion fracture. Otherwise, no acute displaced fractures are identified. No focal bone lesion or bone destruction. Joint spaces are normal. IMPRESSION: Probable avulsion fracture at the base of the right fifth metatarsal bone with  overlying soft tissue swelling. Electronically Signed   By: Lucienne Capers M.D.   On: 09/27/2020 20:15   DG Shoulder Left  Result Date: 09/28/2020 CLINICAL DATA:  Fall with shoulder and neck pain EXAM: LEFT SHOULDER - 2+ VIEW COMPARISON:  Chest CT dated 05/08/2017. FINDINGS: There is no evidence of fracture or dislocation. There is no evidence of arthropathy or other focal bone abnormality. Soft tissues are unremarkable. IMPRESSION: Negative. Electronically Signed   By: Zerita Boers M.D.   On: 09/28/2020 16:01   DG Foot Complete Right  Result Date: 09/27/2020 CLINICAL DATA:  Right ankle pain after slip and fall injury yesterday. EXAM: RIGHT ANKLE - COMPLETE 3+ VIEW; RIGHT FOOT COMPLETE - 3+ VIEW COMPARISON:  Right foot 12/23/2019 FINDINGS: Three views of the right foot and three views of the right ankle are obtained. No evidence of acute fracture or dislocation of the right ankle. In the base of the right fifth metatarsal bone, there is a focal cortical irregularity which is best seen on the ankle views. There is overlying soft tissue swelling suggesting that this is likely an avulsion fracture. Otherwise, no acute displaced fractures are identified. No focal bone lesion or bone destruction. Joint spaces are normal. IMPRESSION: Probable avulsion fracture at the base of the right fifth metatarsal bone with overlying soft tissue swelling. Electronically Signed   By: Lucienne Capers M.D.   On: 09/27/2020 20:15    Procedures Procedures (including critical care time)  Medications Ordered in UC Medications - No data to display  Initial Impression / Assessment and Plan / UC Course  I have reviewed the triage vital signs and the nursing notes.  Pertinent labs & imaging results that were available during my care of the  patient were reviewed by me and considered in my medical decision making (see chart for details).      X-rays were normal.  Patient was provided a refill of Naprosyn to have on  hand as she did not pick this up from ER provider since she left without discharge papers.  She did request additional medication but discussed we are unable to prescribe opioids in urgent care particularly given she has active prescription for Suboxone.  She was provided a work excuse note.  Encouraged her to remain in postop boot as previously recommended.  She was provided contact information for podiatry and encouraged to follow-up on avulsion fracture previously noted.  Strict return precautions given to which patient expressed understanding.  Final Clinical Impressions(s) / UC Diagnoses   Final diagnoses:  Acute pain of left shoulder  Neck pain  Fall, initial encounter     Discharge Instructions     Take Naprosyn twice daily.  Do not take additional NSAIDs including aspirin, ibuprofen/Advil, naproxen/Aleve with this.  Your x-rays were normal which is great news.  Follow-up with podiatry for your foot.    ED Prescriptions    Medication Sig Dispense Auth. Provider   naproxen (NAPROSYN) 500 MG tablet Take 1 tablet (500 mg total) by mouth 2 (two) times daily for 7 days. 14 tablet Zayvien Canning, Derry Skill, PA-C     PDMP not reviewed this encounter.   Terrilee Croak, PA-C 09/28/20 1639

## 2020-10-09 ENCOUNTER — Other Ambulatory Visit: Payer: Self-pay

## 2020-10-09 ENCOUNTER — Ambulatory Visit: Payer: Medicaid Other | Admitting: Physician Assistant

## 2020-10-09 VITALS — BP 103/73 | HR 78 | Temp 98.2°F | Resp 18 | Ht 64.0 in | Wt 172.0 lb

## 2020-10-09 DIAGNOSIS — J039 Acute tonsillitis, unspecified: Secondary | ICD-10-CM | POA: Diagnosis not present

## 2020-10-09 DIAGNOSIS — J3489 Other specified disorders of nose and nasal sinuses: Secondary | ICD-10-CM

## 2020-10-09 LAB — POC COVID19 BINAXNOW: SARS Coronavirus 2 Ag: NEGATIVE

## 2020-10-09 LAB — POCT RAPID STREP A (OFFICE): Rapid Strep A Screen: POSITIVE — AB

## 2020-10-09 MED ORDER — AMOXICILLIN-POT CLAVULANATE 875-125 MG PO TABS: 1.0000 | ORAL_TABLET | Freq: Two times a day (BID) | ORAL | 0 refills | Status: DC

## 2020-10-09 MED ORDER — FLUTICASONE PROPIONATE 50 MCG/ACT NA SUSP: 2.0000 | Freq: Every day | NASAL | 6 refills | Status: DC

## 2020-10-09 NOTE — Patient Instructions (Signed)
Your rapid Covid test was negative.  You will take Augmentin twice a day for the next 10 days to help with your tonsillitis.  I have prescribed Flonase to help with your sinus pressure.  I encourage you to stay well-hydrated, get plenty of rest.  You can continue to use over-the-counter pain medication as needed.  I hope that you feel better soon, please let us know if there is anything else we can do for you  Kennieth Rad, PA-C Physician Assistant [facility] [email or web address]    Strep Throat, Adult Strep throat is an infection in the throat that is caused by bacteria. It is common during the cold months of the year. It mostly affects children who are 51-54 years old. However, people of all ages can get it at any time of the year. This infection spreads from person to person (is contagious) through coughing, sneezing, or having close contact. Your health care provider may use other names to describe the infection. It can be called tonsillitis (if there is swelling of the tonsils), or pharyngitis (if there is swelling at the back of the throat). What are the causes? This condition is caused by the Streptococcus pyogenes bacteria. What increases the risk? You are more likely to develop this condition if:  You care for school-age children, or are around school-age children. Children are more likely to get strep throat and may spread it to others.  You spend time in crowded places where the infection can spread easily.  You have close contact with someone who has strep throat. What are the signs or symptoms? Symptoms of this condition include:  Fever or chills.  Redness, swelling, or pain in the tonsils or throat.  Pain or difficulty when swallowing.  White or yellow spots on the tonsils or throat.  Tender glands in the neck and under the jaw.  Bad smelling breath.  Red rash all over the body. This is rare. How is this  diagnosed? This condition is diagnosed by tests that check for the presence and the amount of bacteria that cause strep throat. They are:  Rapid strep test. Your throat is swabbed and checked for the presence of bacteria. Results are usually ready in minutes.  Throat culture test. Your throat is swabbed. The sample is placed in a cup that allows infections to grow. Results are usually ready in 1 or 2 days.   How is this treated? This condition may be treated with:  Medicines that kill germs (antibiotics).  Medicines that relieve pain or fever. These include: ? Ibuprofen or acetaminophen. ? Aspirin, only for patients who are over the age of 43. ? Throat lozenges. ? Throat sprays. Follow these instructions at home: Medicines  Take over-the-counter and prescription medicines only as told by your health care provider.  Take your antibiotic medicine as told by your health care provider. Do not stop taking the antibiotic even if you start to feel better.   Eating and drinking  If you have trouble swallowing, try eating soft foods until your sore throat feels better.  Drink enough fluid to keep your urine pale yellow.  To help relieve pain, you may have: ? Warm fluids, such as soup and tea. ? Cold fluids, such as frozen desserts or popsicles.   General instructions  Gargle with a salt-water mixture 3-4 times a day or as needed. To make a salt-water mixture, completely dissolve -1 tsp (3-6 g) of salt in 1 cup (237 mL) of warm water.  Get plenty of rest.  Stay home from work or school until you have been taking antibiotics for 24 hours.  Avoid smoking or being around people who smoke.  Keep all follow-up visits as told by your health care provider. This is important. How is this prevented?  Do not share food, drinking cups, or personal items that could cause the infection to spread to other people.  Wash your hands well with soap and water, and make sure that all people in your  house wash their hands well.  Have family members tested if they have a sore throat or fever. They may need an antibiotic if they have strep throat.   Contact a health care provider if:  The glands in your neck continue to get bigger.  You develop a rash, cough, or earache.  You cough up a thick mucus that is green, yellow-brown, or bloody.  You have pain or discomfort that does not get better with medicine.  Your symptoms seem to be getting worse and not better.  You have a fever. Get help right away if:  You have new symptoms, such as vomiting, severe headache, stiff or painful neck, chest pain, or shortness of breath.  You have severe throat pain, drooling, or changes in your voice.  You have swelling of the neck, or the skin on the neck becomes red and tender.  You have signs of dehydration, such as tiredness (fatigue), dry mouth, and decreased urination.  You become increasingly sleepy, or you cannot wake up completely.  Your joints become red or painful. Summary  Strep throat is an infection in the throat that is caused by the Streptococcus pyogenes bacteria. This infection is spread from person to person (is contagious) through coughing, sneezing, or having close contact.  Take your medicines, including antibiotics, as told by your health care provider. Do not stop taking the antibiotic even if you start to feel better.  To prevent the spread of germs, wash your hands well with soap and water. Have others do the same. Do not share food, drinking cups, or personal items.  Get help right away if you have new symptoms, such as vomiting, severe headache, stiff or painful neck, chest pain, or shortness of breath. This information is not intended to replace advice given to you by your health care provider. Make sure you discuss any questions you have with your health care provider. Document Revised: 08/11/2018 Document Reviewed: 08/11/2018 Elsevier Patient Education  [place].

## 2020-10-09 NOTE — Progress Notes (Signed)
Patient reports a cough and HA beginning a week ago with now having sore throat and pain in her ears. Patient has taken suboxene and motrin for the HA.

## 2020-10-09 NOTE — Progress Notes (Signed)
Established Patient Office Visit  Subjective:  Patient ID: Sonya King, female    DOB: [birth year]  Age: 40 y.o. MRN: [id number]  CC:  Chief Complaint  Patient presents with  . URI    Post 1 week  . Sore Throat    HPI Sonya King states that she has been having sinus pressure headaches, sore throat and pain in both ears for the past week. Has tried ibuprofen with some relief. No cough ; no sinus drainage.  Is eating and drinking okay but does endorse discomfort with swallowing.  Denies sick contacts   Past Medical History:  Diagnosis Date  . Anxiety   . Anxiety   . Bipolar 1 disorder ([place])   . Cancer (HCC)    cervical  . Chronic abdominal pain   . Drug-seeking behavior   . Ruptured lumbar disc     Past Surgical History:  Procedure Laterality Date  . ABDOMINAL HYSTERECTOMY    . CHOLECYSTECTOMY    . TONSILLECTOMY      History reviewed. No pertinent family history.  Social History   Socioeconomic History  . Marital status: Single    Spouse name: Not on file  . Number of children: Not on file  . Years of education: Not on file  . Highest education level: Not on file  Occupational History  . Not on file  Tobacco Use  . Smoking status: Current Every Day Smoker    Packs/day: 0.50    Types: Cigarettes  . Smokeless tobacco: Never Used  Substance and Sexual Activity  . Alcohol use: No  . Drug use: No  . Sexual activity: Not on file  Other Topics Concern  . Not on file  Social History Narrative  . Not on file   Social Determinants of Health   Financial Resource Strain: Not on file  Food Insecurity: Not on file  Transportation Needs: Not on file  Physical Activity: Not on file  Stress: Not on file  Social Connections: Not on file  Intimate Partner Violence: Not on file    Outpatient Medications Prior to Visit  Medication Sig Dispense Refill  . clonazePAM (KLONOPIN) 1 MG tablet Take 3 mg by mouth daily.    . clonazePAM (KLONOPIN) 2 MG  tablet Take 2 mg by mouth 4 (four) times daily as needed.    . cyclobenzaprine (FLEXERIL) 10 MG tablet Take 1 tablet (10 mg total) by mouth 2 (two) times daily as needed for muscle spasms. 20 tablet 0  . DULoxetine (CYMBALTA) 30 MG capsule Take 90 mg by mouth daily.    . ondansetron (ZOFRAN ODT) 4 MG disintegrating tablet Take 1 tablet (4 mg total) by mouth every 8 (eight) hours as needed for nausea or vomiting. 10 tablet 0  . pantoprazole (PROTONIX) 40 MG tablet Take 1 tablet (40 mg total) by mouth daily. (Patient not taking: Reported on 12/14/2019) 30 tablet 0  . SUBOXONE 8-2 MG FILM Place under the tongue 3 (three) times daily.    Marland Kitchen zolpidem (AMBIEN) 10 MG tablet Take 10 mg by mouth at bedtime.     . cephALEXin (KEFLEX) 500 MG capsule Take 2 capsules (1,000 mg total) by mouth 2 (two) times daily. (Patient not taking: Reported on 12/14/2019) 20 capsule 0   No facility-administered medications prior to visit.    Allergies  Allergen Reactions  . Avocado Itching and Swelling    Throat swelling   . Benadryl [Diphenhydramine Hcl] Other (See Comments)    Makes  her legs kick    ROS Review of Systems  Constitutional: Negative for chills and fever.  HENT: Positive for ear pain, sinus pressure, sore throat and trouble swallowing. Negative for congestion, postnasal drip and rhinorrhea.   Eyes: Negative.   Respiratory: Negative for shortness of breath.   Cardiovascular: Negative for chest pain.  Gastrointestinal: Negative for abdominal pain, nausea and vomiting.  Endocrine: Negative.   Genitourinary: Negative.   Musculoskeletal: Negative for myalgias.  Skin: Negative.   Allergic/Immunologic: Negative.   Neurological: Positive for headaches.  Hematological: Negative.   Psychiatric/Behavioral: Negative.       Objective:    Physical Exam Vitals and nursing note reviewed.  Constitutional:      Appearance: Normal appearance.  HENT:     Head: Normocephalic.     Right Ear: Tympanic  membrane, ear canal and external ear normal.     Left Ear: Tympanic membrane, ear canal and external ear normal.     Nose:     Right Turbinates: Swollen.     Left Turbinates: Swollen.     Right Sinus: Maxillary sinus tenderness and frontal sinus tenderness present.     Left Sinus: Maxillary sinus tenderness and frontal sinus tenderness present.     Mouth/Throat:     Lips: Pink.     Mouth: Mucous membranes are moist.     Tonsils: Tonsillar exudate present. 1+ on the right. 1+ on the left.  Eyes:     Extraocular Movements: Extraocular movements intact.     Conjunctiva/sclera: Conjunctivae normal.     Pupils: Pupils are equal, round, and reactive to light.  Cardiovascular:     Rate and Rhythm: Normal rate and regular rhythm.     Pulses: Normal pulses.     Heart sounds: Normal heart sounds.  Pulmonary:     Effort: Pulmonary effort is normal.     Breath sounds: Normal breath sounds. No wheezing.  Musculoskeletal:        General: Normal range of motion.     Cervical back: Normal range of motion.  Lymphadenopathy:     Cervical: Cervical adenopathy present.  Skin:    General: Skin is warm and dry.  Neurological:     General: No focal deficit present.     Mental Status: She is alert and oriented to person, place, and time.  Psychiatric:        Mood and Affect: Mood normal.        Behavior: Behavior normal.        Thought Content: Thought content normal.        Judgment: Judgment normal.     BP 103/73 (BP Location: Left Arm, Patient Position: Sitting, Cuff Size: Normal)   Pulse 78   Temp 98.2 F (36.8 C) (Oral)   Resp 18   Ht 5\' 4"  (1.626 m)   Wt 172 lb (78 kg)   SpO2 100%   BMI 29.52 kg/m  Wt Readings from Last 3 Encounters:  10/09/20 172 lb (78 kg)  09/27/20 182 lb 15.7 oz (83 kg)  07/12/20 183 lb (83 kg)     Health Maintenance Due  Topic Date Due  . Hepatitis C Screening  Never done  . COVID-19 Vaccine (1) Never done  . HIV Screening  Never done  . TETANUS/TDAP   Never done  . PAP SMEAR-Modifier  Never done    There are no preventive care reminders to display for this patient.  No results found for: TSH Lab Results  Component Value  Date   WBC 10.8 (H) 07/12/2020   HGB 13.2 07/12/2020   HCT 39.1 07/12/2020   MCV 91.4 07/12/2020   PLT 280 07/12/2020   Lab Results  Component Value Date   NA 138 07/12/2020   K 3.7 07/12/2020   CO2 23 07/12/2020   GLUCOSE 88 07/12/2020   BUN 9 07/12/2020   CREATININE 0.55 07/12/2020   BILITOT 0.6 07/12/2020   ALKPHOS 59 07/12/2020   AST 13 (L) 07/12/2020   ALT 6 07/12/2020   PROT 6.8 07/12/2020   ALBUMIN 3.8 07/12/2020   CALCIUM 9.3 07/12/2020   ANIONGAP 7 07/12/2020   No results found for: CHOL No results found for: HDL No results found for: LDLCALC No results found for: TRIG No results found for: CHOLHDL No results found for: HGBA1C    Assessment & Plan:   Problem List Items Addressed This Visit      Respiratory   Tonsillitis with exudate - Primary   Relevant Medications   amoxicillin-clavulanate (AUGMENTIN) 875-125 MG tablet   Other Relevant Orders   POC COVID-19 (Completed)   Rapid Strep A (Completed)     Other   Sinus pressure   Relevant Medications   fluticasone (FLONASE) 50 MCG/ACT nasal spray    1. Tonsillitis with exudate Rapid COVID test negative, rapid strep test positive.  Trial Augmentin, Flonase.  Patient education given on increasing hydration, rest, over-the-counter pain medications as needed.  Red flags given for prompt reevaluation. - POC COVID-19 - Rapid Strep A - amoxicillin-clavulanate (AUGMENTIN) 875-125 MG tablet; Take 1 tablet by mouth 2 (two) times daily.  Dispense: 20 tablet; Refill: 0  2. Sinus pressure  - fluticasone (FLONASE) 50 MCG/ACT nasal spray; Place 2 sprays into both nostrils daily.  Dispense: 16 g; Refill: 6    I have reviewed the patient's medical history (PMH, PSH, Social History, Family History, Medications, and allergies) , and have been  updated if relevant. I spent 23 minutes reviewing chart and  face to face time with patient.     Meds ordered this encounter  Medications  . amoxicillin-clavulanate (AUGMENTIN) 875-125 MG tablet    Sig: Take 1 tablet by mouth 2 (two) times daily.    Dispense:  20 tablet    Refill:  0    Order Specific Question:   Supervising Provider    Answer:   Asencion Noble E [[id number]]  . fluticasone (FLONASE) 50 MCG/ACT nasal spray    Sig: Place 2 sprays into both nostrils daily.    Dispense:  16 g    Refill:  6    Order Specific Question:   Supervising Provider    Answer:   Elsie Stain [[id number]]    Follow-up: Return if symptoms worsen or fail to improve.    Loraine Grip Mayers, PA-C

## 2020-10-29 ENCOUNTER — Other Ambulatory Visit: Payer: Self-pay

## 2020-10-29 ENCOUNTER — Ambulatory Visit
Admission: EM | Admit: 2020-10-29 | Discharge: 2020-10-29 | Disposition: A | Discharge status: Home / Self Care | Payer: Medicaid Other | Attending: Emergency Medicine | Admitting: Emergency Medicine

## 2020-10-29 ENCOUNTER — Encounter: Payer: Self-pay | Admitting: Emergency Medicine

## 2020-10-29 DIAGNOSIS — K047 Periapical abscess without sinus: Secondary | ICD-10-CM

## 2020-10-29 DIAGNOSIS — K0889 Other specified disorders of teeth and supporting structures: Secondary | ICD-10-CM

## 2020-10-29 MED ORDER — MELOXICAM 15 MG PO TABS: 15.0000 mg | ORAL_TABLET | Freq: Every day | ORAL | 0 refills | Status: AC

## 2020-10-29 MED ORDER — LIDOCAINE VISCOUS HCL 2 % MT SOLN
10.0000 mL | Freq: Four times a day (QID) | OROMUCOSAL | 0 refills | Status: DC | PRN
Start: 2020-10-29 — End: 2021-05-25

## 2020-10-29 MED ORDER — AMOXICILLIN-POT CLAVULANATE 875-125 MG PO TABS: 1.0000 | ORAL_TABLET | Freq: Two times a day (BID) | ORAL | 0 refills | Status: AC

## 2020-10-29 MED ORDER — CHLORHEXIDINE GLUCONATE 0.12 % MT SOLN
OROMUCOSAL | 0 refills | Status: DC
Start: 2020-10-29 — End: 2021-05-25

## 2020-10-29 MED ORDER — MELOXICAM 15 MG PO TABS
15.0000 mg | ORAL_TABLET | Freq: Every day | ORAL | 0 refills | Status: DC
Start: 2020-10-29 — End: 2020-10-29

## 2020-10-29 NOTE — ED Provider Notes (Signed)
HPI  SUBJECTIVE:  Sonya King is a 40 y.o. female who presents with upper dental pain.  She is status post removal of multiple teeth.  She reports gum swelling, bloody, purulent drainage, facial swelling, mild trismus.  No fevers.  She has been taking ibuprofen 400 twice daily and 800 at night alternating with Tylenol 500 mg twice daily and at thousand at night and Goody's powder with no relief in her symptoms.  Symptoms worse with eating, drinking, talking.  Upper she states that she told her dentist that she was unable to take Tylenol and ibuprofen because she has been taking it for a long time and states that she was then "dropped as a client "and was sent home with no pain medications or antibiotics. She is supposed to have the sutures removed in 2 days.  She was recently on amoxicillin for strep throat.  No antipyretic in the past 6 hours.  She takes Suboxone for chronic pain, has not taken any in 2 weeks per her dentist's instructions-states that she was instructed to stop taking it 2 weeks prior to the procedure.  She has a history of anxiety.  No history of diabetes, hypertension.  LMP: Status post hysterectomy.  PMD: Dr. Nancy Fetter at [facility].   Past Medical History:  Diagnosis Date  . Anxiety   . Anxiety   . Bipolar 1 disorder ([place])   . Cancer (HCC)    cervical  . Chronic abdominal pain   . Drug-seeking behavior   . Ruptured lumbar disc     Past Surgical History:  Procedure Laterality Date  . ABDOMINAL HYSTERECTOMY    . CHOLECYSTECTOMY    . TONSILLECTOMY      History reviewed. No pertinent family history.  Social History   Tobacco Use  . Smoking status: Current Every Day Smoker    Packs/day: 0.50    Types: Cigarettes  . Smokeless tobacco: Never Used  Substance Use Topics  . Alcohol use: No  . Drug use: No    No current facility-administered medications for this encounter.  Current Outpatient Medications:  .  amoxicillin-clavulanate (AUGMENTIN) 875-125 MG  tablet, Take 1 tablet by mouth 2 (two) times daily for 7 days., Disp: 14 tablet, Rfl: 0 .  chlorhexidine (PERIDEX) 0.12 % solution, 15 mL swish and spit bid, Disp: 480 mL, Rfl: 0 .  lidocaine (XYLOCAINE) 2 % solution, Use as directed 10 mLs in the mouth or throat every 6 (six) hours as needed for mouth pain. Hold in mouth and spit. Do not swallow., Disp: 100 mL, Rfl: 0 .  clonazePAM (KLONOPIN) 1 MG tablet, Take 3 mg by mouth daily., Disp: , Rfl:  .  clonazePAM (KLONOPIN) 2 MG tablet, Take 2 mg by mouth 4 (four) times daily as needed., Disp: , Rfl:  .  cyclobenzaprine (FLEXERIL) 10 MG tablet, Take 1 tablet (10 mg total) by mouth 2 (two) times daily as needed for muscle spasms., Disp: 20 tablet, Rfl: 0 .  DULoxetine (CYMBALTA) 30 MG capsule, Take 90 mg by mouth daily., Disp: , Rfl:  .  fluticasone (FLONASE) 50 MCG/ACT nasal spray, Place 2 sprays into both nostrils daily., Disp: 16 g, Rfl: 6 .  ondansetron (ZOFRAN ODT) 4 MG disintegrating tablet, Take 1 tablet (4 mg total) by mouth every 8 (eight) hours as needed for nausea or vomiting., Disp: 10 tablet, Rfl: 0 .  pantoprazole (PROTONIX) 40 MG tablet, Take 1 tablet (40 mg total) by mouth daily. (Patient not taking: Reported on 12/14/2019),  Disp: 30 tablet, Rfl: 0 .  SUBOXONE 8-2 MG FILM, Place under the tongue 3 (three) times daily., Disp: , Rfl:  .  zolpidem (AMBIEN) 10 MG tablet, Take 10 mg by mouth at bedtime. , Disp: , Rfl:   Allergies  Allergen Reactions  . Avocado Itching and Swelling    Throat swelling   . Benadryl [Diphenhydramine Hcl] Other (See Comments)    Makes her legs kick     ROS  As noted in HPI.   Physical Exam  BP 122/73 (BP Location: Left Arm)   Pulse 86   Temp 97.9 F (36.6 C) (Oral)   Resp 18   SpO2 95%   Constitutional: Well developed, well nourished, appears uncomfortable Eyes:  EOMI, conjunctiva normal bilaterally HENT: Normocephalic, atraumatic,mucus membranes moist.  Upper teeth surgically absent.  Positive  purulence.  Stitches intact.  Swollen gums. Neck: No cervical adenopathy  respiratory: Normal inspiratory effort Cardiovascular: Normal rate GI: nondistended skin: No rash, skin intact Musculoskeletal: no deformities Neurologic: Alert & oriented x 3, no focal neuro deficits Psychiatric: Speech and behavior appropriate   ED Course   Medications - No data to display  No orders of the defined types were placed in this encounter.   No results found for this or any previous visit (from the past 24 hour(s)). No results found.  ED Clinical Impression  1. Pain, dental   2. Dental infection      ED Assessment/Plan  [place] Narcotic database reviewed for this patient, patient on Suboxone, Klonopin and Ambien.  Will restart patient on Augmentin as it appears that she has a infection.  Recommended salt water rinses, will prescribe Peridex or she may use Listerine to help keep the suture site clean.  Will try some viscous lidocaine and Mobic.  I suggested that she take Tylenol with Mobic, however she states that she will not take it as she has been taking it frequently for the past 7 or 8 months,  is reasonable.  Advised her to restart the Suboxone as well.   will provide dental list for ongoing care.  She is to contact her primary care provider if the Mobic and viscous lidocaine do not help and he/she can consider narcotics at that time.  Discussed MDM, treatment plan, and plan for follow-up with patient. patient agrees with plan.   Meds ordered this encounter  Medications  . chlorhexidine (PERIDEX) 0.12 % solution    Sig: 15 mL swish and spit bid    Dispense:  480 mL    Refill:  0  . lidocaine (XYLOCAINE) 2 % solution    Sig: Use as directed 10 mLs in the mouth or throat every 6 (six) hours as needed for mouth pain. Hold in mouth and spit. Do not swallow.    Dispense:  100 mL    Refill:  0  . amoxicillin-clavulanate (AUGMENTIN) 875-125 MG tablet    Sig: Take 1 tablet by mouth 2 (two)  times daily for 7 days.    Dispense:  14 tablet    Refill:  0      *This clinic note was created using [employer]. Therefore, there may be occasional mistakes despite careful proofreading.  ?    Melynda Ripple, MD 10/30/20 [phone]

## 2020-10-29 NOTE — Discharge Instructions (Addendum)
The Augmentin is for an infection.  You may use salt water rinses, Peridex, or Listerine rinses to help keep this clean and wash away infection.  Try the Mobic instead of the ibuprofen.  I would suggest taking 1000 mg of Tylenol with it, but I understand your concerns.  Try the viscous lidocaine, that should numb the pain and will let you be able to work without being overly medicated.  If this does not work, then please contact your primary care provider.  Follow-up with a dentist of your choice ASAP.

## 2020-10-29 NOTE — ED Triage Notes (Signed)
Pt here for upper dental pain after having all upper teeth extracted 2 days ago; pt sts OTC meds not helping pain

## 2020-11-05 NOTE — Congregational Nurse Program (Signed)
  Dept: Winter Nurse Program Note  Date of Encounter: 11/05/2020  Past Medical History: Past Medical History:  Diagnosis Date  . Anxiety   . Anxiety   . Bipolar 1 disorder ([place])   . Cancer (HCC)    cervical  . Chronic abdominal pain   . Drug-seeking behavior   . Ruptured lumbar disc     Encounter Details:  CNP Questionnaire - 10/29/20 1400      Questionnaire   Do you give verbal consent to treat you today? Yes    Visit Setting Other    Location Patient Served At [facility]    Patient Status Homeless    Medical Provider Yes    Insurance Medicaid    Intervention Counsel;Educate;Refer;Support    Housing/Utilities No permanent housing    Referrals Dental;Urgent Care          client was seen on 10-29-2020 in pain due extractions of upper teeth on 10-28-2020. States she cant eat or drink too much pain ,face red ,obviously client is in pain and states tylenol wont help , no warm salt water rinses ,states lots of blood last night ,not as much today. Nurse did recommend  Urgent care since it was close to closing for her doctors office has no money for co pays . 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 Talked with the mom about the nurses role again and how I can assist her with managing some health care issues . States children are established with health care now .appointments made and kept. Dental work done by [employer] . Mentioned she was dropped from care once she mentioned she needed pain medications . ??? offered support ,right now needs to be seen for dental pain,dehydration to prevent infection. To urgent care. Follow weekly. Encouraged client to return the following week once she feels better.

## 2021-02-22 ENCOUNTER — Encounter (HOSPITAL_COMMUNITY): Payer: Self-pay

## 2021-02-22 ENCOUNTER — Emergency Department (HOSPITAL_COMMUNITY): Payer: Medicaid Other

## 2021-02-22 ENCOUNTER — Emergency Department (HOSPITAL_COMMUNITY)
Admission: EM | Admit: 2021-02-22 | Discharge: 2021-02-22 | Disposition: A | Discharge status: Home / Self Care | Payer: Medicaid Other | Attending: Emergency Medicine | Admitting: Emergency Medicine

## 2021-02-22 ENCOUNTER — Other Ambulatory Visit: Payer: Self-pay

## 2021-02-22 DIAGNOSIS — Z8541 Personal history of malignant neoplasm of cervix uteri: Secondary | ICD-10-CM | POA: Diagnosis not present

## 2021-02-22 DIAGNOSIS — S8012XA Contusion of left lower leg, initial encounter: Secondary | ICD-10-CM | POA: Diagnosis not present

## 2021-02-22 DIAGNOSIS — M545 Low back pain, unspecified: Secondary | ICD-10-CM | POA: Diagnosis not present

## 2021-02-22 DIAGNOSIS — S0081XA Abrasion of other part of head, initial encounter: Secondary | ICD-10-CM | POA: Diagnosis not present

## 2021-02-22 DIAGNOSIS — S0990XA Unspecified injury of head, initial encounter: Secondary | ICD-10-CM | POA: Diagnosis present

## 2021-02-22 DIAGNOSIS — S7011XA Contusion of right thigh, initial encounter: Secondary | ICD-10-CM | POA: Insufficient documentation

## 2021-02-22 DIAGNOSIS — F17210 Nicotine dependence, cigarettes, uncomplicated: Secondary | ICD-10-CM | POA: Diagnosis not present

## 2021-02-22 DIAGNOSIS — W108XXA Fall (on) (from) other stairs and steps, initial encounter: Secondary | ICD-10-CM | POA: Insufficient documentation

## 2021-02-22 DIAGNOSIS — W19XXXA Unspecified fall, initial encounter: Secondary | ICD-10-CM

## 2021-02-22 DIAGNOSIS — S8011XA Contusion of right lower leg, initial encounter: Secondary | ICD-10-CM | POA: Insufficient documentation

## 2021-02-22 LAB — I-STAT BETA HCG BLOOD, ED (MC, WL, AP ONLY): I-stat hCG, quantitative: <5 m[IU]/mL (ref <5)

## 2021-02-22 MED ORDER — HYDROCODONE-ACETAMINOPHEN 5-325 MG PO TABS
2.0000 | ORAL_TABLET | Freq: Once | ORAL | Status: AC
  Administered 2021-02-22: 2 via ORAL
  Filled 2021-02-22: qty 2

## 2021-02-22 NOTE — ED Triage Notes (Signed)
Pt came in with c/o back and leg pain after falling down 10 stairs yesterday. Pt has small abrasion to forehead. Pt states that she lost consciousness.

## 2021-02-22 NOTE — ED Provider Notes (Signed)
[facility] DEPT Provider Note   CSN: [id number] Arrival date & time: 02/22/21  0124     History Chief Complaint  Patient presents with   Lytle Michaels    Sonya King is a 40 y.o. female.  Patient presents the emergency department with a chief complaint of back pain.  She states that yesterday afternoon she fell down a flight of stairs.  She states that this happened because she has lupus and was out of her pain medication, and feels like her legs gave out because of pain.  She sustained bruising to her legs, complains of pain in the low back, and hit her forehead.  She states that she passed out.  She reports an episode of vomiting.  Denies any seizure.  Denies any successful treatments prior to arrival.  States she is out of her pain medication.  The history is provided by the patient. No language interpreter was used.      Past Medical History:  Diagnosis Date   Anxiety    Anxiety    Bipolar 1 disorder ([place])    Cancer ([place])    cervical   Chronic abdominal pain    Drug-seeking behavior    Ruptured lumbar disc     Patient Active Problem List   Diagnosis Date Noted   Tonsillitis with exudate 10/09/2020   Sinus pressure 10/09/2020   Cystitis, chronic 09/21/2016    Past Surgical History:  Procedure Laterality Date   ABDOMINAL HYSTERECTOMY     CHOLECYSTECTOMY     TONSILLECTOMY       OB History     Gravida  4   Para  4   Term  4   Preterm  0   AB  0   Living  0      SAB  0   IAB  0   Ectopic  0   Multiple  0   Live Births  4           History reviewed. No pertinent family history.  Social History   Tobacco Use   Smoking status: Every Day    Packs/day: 0.50    Types: Cigarettes   Smokeless tobacco: Never  Substance Use Topics   Alcohol use: No   Drug use: No    Home Medications Prior to Admission medications   Medication Sig Start Date End Date Taking? Authorizing Provider  chlorhexidine (PERIDEX)  0.12 % solution 15 mL swish and spit bid 10/29/20   Melynda Ripple, MD  clonazePAM (KLONOPIN) 1 MG tablet Take 3 mg by mouth daily. 06/21/19   [provider]  clonazePAM (KLONOPIN) 2 MG tablet Take 2 mg by mouth 4 (four) times daily as needed. 09/25/20   [provider]  cyclobenzaprine (FLEXERIL) 10 MG tablet Take 1 tablet (10 mg total) by mouth 2 (two) times daily as needed for muscle spasms. 12/14/19   Pattricia Boss, MD  DULoxetine (CYMBALTA) 30 MG capsule Take 90 mg by mouth daily. 07/07/20   [provider]  fluticasone (FLONASE) 50 MCG/ACT nasal spray Place 2 sprays into both nostrils daily. 10/09/20   Mayers, Cari S, PA-C  lidocaine (XYLOCAINE) 2 % solution Use as directed 10 mLs in the mouth or throat every 6 (six) hours as needed for mouth pain. Hold in mouth and spit. Do not swallow. 10/29/20   Melynda Ripple, MD  ondansetron (ZOFRAN ODT) 4 MG disintegrating tablet Take 1 tablet (4 mg total) by mouth every 8 (eight) hours as  needed for nausea or vomiting. 07/12/20   Antonietta Breach, PA-C  pantoprazole (PROTONIX) 40 MG tablet Take 1 tablet (40 mg total) by mouth daily. Patient not taking: Reported on 12/14/2019 06/29/19   Lajean Saver, MD  SUBOXONE 8-2 MG FILM Place under the tongue 3 (three) times daily. 10/06/20   [provider]  zolpidem (AMBIEN) 10 MG tablet Take 10 mg by mouth at bedtime.  06/21/19   [provider]    Allergies    Avocado and Benadryl [diphenhydramine hcl]  Review of Systems   Review of Systems  All other systems reviewed and are negative.  Physical Exam Updated Vital Signs BP (!) 123/97   Pulse 85   Temp 98.3 F (36.8 C)   Resp 18   Ht '5\' 4"'$  (1.626 m)   Wt 78 kg   SpO2 97%   BMI 29.52 kg/m   Physical Exam Vitals and nursing note reviewed.  Constitutional:      General: She is not in acute distress.    Appearance: She is well-developed.  HENT:     Head: Normocephalic and atraumatic.     Comments: Minor abrasion  to forehead, no laceration, no skull deformity Eyes:     Conjunctiva/sclera: Conjunctivae normal.  Cardiovascular:     Rate and Rhythm: Normal rate and regular rhythm.     Heart sounds: No murmur heard. Pulmonary:     Effort: Pulmonary effort is normal. No respiratory distress.     Breath sounds: Normal breath sounds.  Abdominal:     Palpations: Abdomen is soft.     Tenderness: There is no abdominal tenderness.  Musculoskeletal:     Cervical back: Neck supple.     Comments: Moves all extremities Some lumbar paraspinal muscle tenderness No bony step-off or deformity of the spine  Skin:    General: Skin is warm and dry.     Comments: Contusion to right lateral thigh  Neurological:     Mental Status: She is alert and oriented to person, place, and time.  Psychiatric:        Mood and Affect: Mood normal.        Behavior: Behavior normal.    ED Results / Procedures / Treatments   Labs (all labs ordered are listed, but only abnormal results are displayed) Labs Reviewed - No data to display  EKG None  Radiology No results found.  Procedures Procedures   Medications Ordered in ED Medications - No data to display  ED Course  I have reviewed the triage vital signs and the nursing notes.  Pertinent labs & imaging results that were available during my care of the patient were reviewed by me and considered in my medical decision making (see chart for details).    MDM Rules/Calculators/A&P                           Patient with fall down a flight of stairs yesterday.  She hit her head and passed out.  Had an episode of vomiting.  Has not had any seizure activity.  States that the soreness is increased over the past day.  CT cervical spine and head are negative for fracture or acute traumatic process.  Lumbar plain films are negative for trauma as well.  Patient is ambulatory.  She is reassured after her imaging studies.  She appears stable for discharge. Final Clinical  Impression(s) / ED Diagnoses Final diagnoses:  Fall, initial encounter  Injury  of head, initial encounter  Contusion of right thigh, initial encounter    Rx / DC Orders ED Discharge Orders     None        Montine Circle, PA-C 02/22/21 0627    Shanon Rosser, MD 02/22/21 [phone]

## 2021-04-30 ENCOUNTER — Emergency Department (HOSPITAL_COMMUNITY)
Admission: EM | Admit: 2021-04-30 | Discharge: 2021-04-30 | Disposition: A | Discharge status: Home / Self Care | Payer: Medicaid Other | Source: Home / Self Care | Attending: Emergency Medicine | Admitting: Emergency Medicine

## 2021-04-30 ENCOUNTER — Emergency Department (HOSPITAL_COMMUNITY)
Admission: EM | Admit: 2021-04-30 | Discharge: 2021-04-30 | Disposition: A | Discharge status: Home / Self Care | Payer: Medicaid Other | Attending: Emergency Medicine | Admitting: Emergency Medicine

## 2021-04-30 ENCOUNTER — Encounter (HOSPITAL_COMMUNITY): Payer: Self-pay

## 2021-04-30 ENCOUNTER — Other Ambulatory Visit: Payer: Self-pay

## 2021-04-30 DIAGNOSIS — Z79899 Other long term (current) drug therapy: Secondary | ICD-10-CM

## 2021-04-30 DIAGNOSIS — F1193 Opioid use, unspecified with withdrawal: Secondary | ICD-10-CM

## 2021-04-30 DIAGNOSIS — Z7289 Other problems related to lifestyle: Secondary | ICD-10-CM | POA: Insufficient documentation

## 2021-04-30 DIAGNOSIS — Z5321 Procedure and treatment not carried out due to patient leaving prior to being seen by health care provider: Secondary | ICD-10-CM | POA: Insufficient documentation

## 2021-04-30 DIAGNOSIS — F1123 Opioid dependence with withdrawal: Secondary | ICD-10-CM | POA: Insufficient documentation

## 2021-04-30 DIAGNOSIS — F17210 Nicotine dependence, cigarettes, uncomplicated: Secondary | ICD-10-CM | POA: Insufficient documentation

## 2021-04-30 DIAGNOSIS — Z85828 Personal history of other malignant neoplasm of skin: Secondary | ICD-10-CM | POA: Insufficient documentation

## 2021-04-30 HISTORY — DX: Systemic lupus erythematosus, unspecified: M32.9

## 2021-04-30 HISTORY — DX: Reserved for concepts with insufficient information to code with codable children: IMO0002

## 2021-04-30 LAB — CBC WITH DIFFERENTIAL/PLATELET
Abs Immature Granulocytes: 0.02 10*3/uL (ref 0.00–0.07)
Basophils Absolute: 0 10*3/uL (ref 0.0–0.1)
Basophils Relative: 1 %
Eosinophils Absolute: 0.1 10*3/uL (ref 0.0–0.5)
Eosinophils Relative: 2 %
HCT: 38.1 % (ref 36.0–46.0)
Hemoglobin: 13.1 g/dL (ref 12.0–15.0)
Immature Granulocytes: 0 %
Lymphocytes Relative: 30 %
Lymphs Abs: 2 10*3/uL (ref 0.7–4.0)
MCH: 31.7 pg (ref 26.0–34.0)
MCHC: 34.4 g/dL (ref 30.0–36.0)
MCV: 92.3 fL (ref 80.0–100.0)
Monocytes Absolute: 0.6 10*3/uL (ref 0.1–1.0)
Monocytes Relative: 8 %
Neutro Abs: 4.1 10*3/uL (ref 1.7–7.7)
Neutrophils Relative %: 59 %
Platelets: 228 10*3/uL (ref 150–400)
RBC: 4.13 MIL/uL (ref 3.87–5.11)
RDW: 13.2 % (ref 11.5–15.5)
WBC: 6.9 10*3/uL (ref 4.0–10.5)
nRBC: 0 % (ref 0.0–0.2)

## 2021-04-30 LAB — BASIC METABOLIC PANEL
Anion gap: 9 (ref 5–15)
BUN: 14 mg/dL (ref 6–20)
CO2: 24 mmol/L (ref 22–32)
Calcium: 9.3 mg/dL (ref 8.9–10.3)
Chloride: 104 mmol/L (ref 98–111)
Creatinine, Ser: 0.53 mg/dL (ref 0.44–1.00)
GFR, Estimated: >60 mL/min (ref >60)
Glucose, Bld: 106 mg/dL — ABNORMAL HIGH (ref 70–99)
Potassium: 3.6 mmol/L (ref 3.5–5.1)
Sodium: 137 mmol/L (ref 135–145)

## 2021-04-30 MED ORDER — HYDROXYZINE HCL 25 MG PO TABS: 25.0000 mg | ORAL_TABLET | Freq: Four times a day (QID) | ORAL | 0 refills | Status: DC

## 2021-04-30 MED ORDER — KETOROLAC TROMETHAMINE 15 MG/ML IJ SOLN
15.0000 mg | Freq: Once | INTRAMUSCULAR | Status: AC
  Administered 2021-04-30: 15 mg via INTRAMUSCULAR
  Filled 2021-04-30: qty 1

## 2021-04-30 MED ORDER — ONDANSETRON HCL 4 MG PO TABS: 4.0000 mg | ORAL_TABLET | Freq: Four times a day (QID) | ORAL | 0 refills | Status: DC

## 2021-04-30 MED ORDER — ONDANSETRON 4 MG PO TBDP
4.0000 mg | ORAL_TABLET | Freq: Once | ORAL | Status: AC
  Administered 2021-04-30: 4 mg via ORAL
  Filled 2021-04-30: qty 1

## 2021-04-30 NOTE — Discharge Instructions (Signed)
Your kidney function is perfect.  Please continue to follow-up with methadone clinic for long-term treatment.  Prescriptions for nausea and sleeping medication are at the pharmacy.

## 2021-04-30 NOTE — ED Triage Notes (Addendum)
Patient reports she has been taking methadone for 3 months, hasn't had any in 3 days because she lives 3 hours away from clinic. Patient reports pain 10/10 all over body. Requesting methadone, patient reports she had one seizure last night and two today.

## 2021-04-30 NOTE — ED Provider Notes (Addendum)
[facility] [facility] DEPT Provider Note   CSN: [id number] Arrival date & time: 04/30/21  7106     History Chief Complaint  Patient presents with   Withdrawal    Sonya King is a 40 y.o. 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  She reports that she goes to the methadone clinic every day however they are currently weaning down her dose so that they can do lab work on Monday.  She said that the clinic sent her here to get more methadone since she told them she was withdrawing.  Withdrawal symptoms include nausea, shaking, subjective seizures and pain all over.  Believes she received 15 of methadone this morning.  Has a past medical history of lupus which she reports is how she began taking narcotics.   Past Medical History:  Diagnosis Date   Anxiety    Anxiety    Bipolar 1 disorder ([place])    Chronic abdominal pain    Drug-seeking behavior    Lupus ([place])    Ruptured lumbar disc     Patient Active Problem List   Diagnosis Date Noted   Tonsillitis with exudate 10/09/2020   Sinus pressure 10/09/2020   Cystitis, chronic 09/21/2016    Past Surgical History:  Procedure Laterality Date   ABDOMINAL HYSTERECTOMY     CHOLECYSTECTOMY     TONSILLECTOMY       OB History     Gravida  4   Para  4   Term  4   Preterm  0   AB  0   Living  0      SAB  0   IAB  0   Ectopic  0   Multiple  0   Live Births  4           History reviewed. No pertinent family history.  Social History   Tobacco Use   Smoking status: Every Day    Packs/day: 0.50    Types: Cigarettes   Smokeless tobacco: Never  Vaping Use   Vaping Use: Never used  Substance Use Topics   Alcohol use: No   Drug use: No    Home Medications Prior to Admission medications   Medication Sig Start Date End Date Taking? Authorizing Provider  chlorhexidine (PERIDEX) 0.12 % solution 15 mL swish  and spit bid 10/29/20   Melynda Ripple, MD  clonazePAM (KLONOPIN) 1 MG tablet Take 3 mg by mouth daily. 06/21/19   [provider]  clonazePAM (KLONOPIN) 2 MG tablet Take 2 mg by mouth 4 (four) times daily as needed. 09/25/20   [provider]  cyclobenzaprine (FLEXERIL) 10 MG tablet Take 1 tablet (10 mg total) by mouth 2 (two) times daily as needed for muscle spasms. 12/14/19   Pattricia Boss, MD  DULoxetine (CYMBALTA) 30 MG capsule Take 90 mg by mouth daily. 07/07/20   [provider]  fluticasone (FLONASE) 50 MCG/ACT nasal spray Place 2 sprays into both nostrils daily. 10/09/20   Mayers, Cari S, PA-C  hydrOXYzine (ATARAX/VISTARIL) 25 MG tablet Take 1 tablet (25 mg total) by mouth every 6 (six) hours. 04/30/21   Malachi Suderman A, PA-C  lidocaine (XYLOCAINE) 2 % solution Use as directed 10 mLs in the mouth or throat every 6 (six) hours as needed for mouth pain. Hold in mouth and spit. Do not swallow. 10/29/20   Melynda Ripple, MD  ondansetron (ZOFRAN ODT) 4 MG disintegrating tablet  Take 1 tablet (4 mg total) by mouth every 8 (eight) hours as needed for nausea or vomiting. 07/12/20   Antonietta Breach, PA-C  ondansetron (ZOFRAN) 4 MG tablet Take 1 tablet (4 mg total) by mouth every 6 (six) hours. 04/30/21   Torrence Branagan A, PA-C  pantoprazole (PROTONIX) 40 MG tablet Take 1 tablet (40 mg total) by mouth daily. Patient not taking: Reported on 12/14/2019 06/29/19   Lajean Saver, MD  SUBOXONE 8-2 MG FILM Place under the tongue 3 (three) times daily. 10/06/20   [provider]  zolpidem (AMBIEN) 10 MG tablet Take 10 mg by mouth at bedtime.  06/21/19   [provider]    Allergies    Avocado and Benadryl [diphenhydramine hcl]  Review of Systems   Review of Systems  Gastrointestinal:  Positive for nausea.  Musculoskeletal:  Positive for myalgias.  Neurological:  Positive for seizures, syncope and headaches.  All other systems reviewed and are negative.  Physical  Exam Updated Vital Signs BP 122/76 (BP Location: Left Arm)   Pulse 86   Temp 98.8 F (37.1 C) (Oral)   Resp 19   Ht 5\' 4"  (1.626 m)   Wt 84.4 kg   SpO2 98%   BMI 31.93 kg/m   Physical Exam Vitals and nursing note reviewed.  Constitutional:      General: She is not in acute distress.    Appearance: Normal appearance. She is ill-appearing.  HENT:     Head: Normocephalic and atraumatic.     Mouth/Throat:     Mouth: Mucous membranes are moist.     Pharynx: Oropharynx is clear.  Eyes:     General: No scleral icterus.    Conjunctiva/sclera: Conjunctivae normal.  Cardiovascular:     Rate and Rhythm: Normal rate and regular rhythm.  Pulmonary:     Effort: Pulmonary effort is normal. No respiratory distress.  Skin:    General: Skin is warm and dry.     Findings: No rash.  Neurological:     Mental Status: She is alert.  Psychiatric:     Comments: Anxious and agitated    ED Results / Procedures / Treatments   Labs (all labs ordered are listed, but only abnormal results are displayed) Labs Reviewed - No data to display  EKG None  Radiology No results found.  Procedures Procedures   Medications Ordered in ED Medications - No data to display  ED Course  I have reviewed the triage vital signs and the nursing notes.  Pertinent labs & imaging results that were available during my care of the patient were reviewed by me and considered in my medical decision making (see chart for details).    MDM Rules/Calculators/A&P I saw this patient this morning and she left AMA.  She is now returning for further medications and treatment of her withdrawal.  She reported to me that she is going to need a kidney transplant.  I discussed that I cannot give her Toradol as planned if she has very poor kidney function.  Lab work reveals a normal creatinine and GFR >60.   Plan is to give the patient a dose of Zofran and Toradol in the department today.  She denies the desire for any  Tylenol.  I debated giving her a dose of Suboxone however because she had methadone this morning, this is contraindicated.  She goes to the methadone clinic tomorrow and I believe she is stable to follow-up with them about her treatment and dosing. Zofran  and Vistaril sent to pharmacy.  Agreeable and ambulatory.  Final Clinical Impression(s) / ED Diagnoses Final diagnoses:  Methadone withdrawal ([place])    Rx / DC Orders Results and diagnoses were explained to the patient. Return precautions discussed in full. Patient had no additional questions and expressed complete understanding.     Rhae Hammock, PA-C 04/30/21 1331    Rhae Hammock, PA-C 04/30/21 1334    Godfrey Pick, MD 04/30/21 1343

## 2021-04-30 NOTE — ED Notes (Signed)
RN was attaching pt to the monitor when the PA came in to assess. PA informed pt that we are unable to provide methadone, but we were able to treat symptoms. Pt. Stated that she only wanted Methadone and she did not want to be treated with anything else. Pt. Refused to sign the AMA form and rushed out of the room ripping off all the monitor cords. Pt. Educated on risk of leaving hospital AMA.

## 2021-04-30 NOTE — ED Provider Notes (Addendum)
[facility] DEPT Provider Note   CSN: [id number] Arrival date & time: 04/30/21  3244     History Chief Complaint  Patient presents with   Withdrawal    Sonya King is a 40 y.o. female with a past medical history of bipolar disorder and drug-seeking behavior presenting today with complaint insomnia for 3 days, withdrawing from methadone and "6 seizures yesterday and 3 this morning."   Difficult to obtain history, as patient is hysterical.   Past Medical History:  Diagnosis Date   Anxiety    Anxiety    Bipolar 1 disorder ([place])    Cancer ([place])    cervical   Chronic abdominal pain    Drug-seeking behavior    Ruptured lumbar disc     Patient Active Problem List   Diagnosis Date Noted   Tonsillitis with exudate 10/09/2020   Sinus pressure 10/09/2020   Cystitis, chronic 09/21/2016    Past Surgical History:  Procedure Laterality Date   ABDOMINAL HYSTERECTOMY     CHOLECYSTECTOMY     TONSILLECTOMY       OB History     Gravida  4   Para  4   Term  4   Preterm  0   AB  0   Living  0      SAB  0   IAB  0   Ectopic  0   Multiple  0   Live Births  4           History reviewed. No pertinent family history.  Social History   Tobacco Use   Smoking status: Every Day    Packs/day: 0.50    Types: Cigarettes   Smokeless tobacco: Never  Substance Use Topics   Alcohol use: No   Drug use: No    Home Medications Prior to Admission medications   Medication Sig Start Date End Date Taking? Authorizing Provider  chlorhexidine (PERIDEX) 0.12 % solution 15 mL swish and spit bid 10/29/20   Melynda Ripple, MD  clonazePAM (KLONOPIN) 1 MG tablet Take 3 mg by mouth daily. 06/21/19   [provider]  clonazePAM (KLONOPIN) 2 MG tablet Take 2 mg by mouth 4 (four) times daily as needed. 09/25/20   [provider]  cyclobenzaprine (FLEXERIL) 10 MG tablet Take 1 tablet (10 mg total) by mouth 2 (two) times  daily as needed for muscle spasms. 12/14/19   Pattricia Boss, MD  DULoxetine (CYMBALTA) 30 MG capsule Take 90 mg by mouth daily. 07/07/20   [provider]  fluticasone (FLONASE) 50 MCG/ACT nasal spray Place 2 sprays into both nostrils daily. 10/09/20   Mayers, Cari S, PA-C  lidocaine (XYLOCAINE) 2 % solution Use as directed 10 mLs in the mouth or throat every 6 (six) hours as needed for mouth pain. Hold in mouth and spit. Do not swallow. 10/29/20   Melynda Ripple, MD  ondansetron (ZOFRAN ODT) 4 MG disintegrating tablet Take 1 tablet (4 mg total) by mouth every 8 (eight) hours as needed for nausea or vomiting. 07/12/20   Antonietta Breach, PA-C  pantoprazole (PROTONIX) 40 MG tablet Take 1 tablet (40 mg total) by mouth daily. Patient not taking: Reported on 12/14/2019 06/29/19   Lajean Saver, MD  SUBOXONE 8-2 MG FILM Place under the tongue 3 (three) times daily. 10/06/20   [provider]  zolpidem (AMBIEN) 10 MG tablet Take 10 mg by mouth at bedtime.  06/21/19   [provider]    Allergies  Avocado and Benadryl [diphenhydramine hcl]  Review of Systems   Review of Systems  Neurological:  Positive for seizures.  Psychiatric/Behavioral:  Positive for sleep disturbance.    Physical Exam Updated Vital Signs BP 122/76 (BP Location: Left Arm)   Pulse 86   Temp 98.8 F (37.1 C) (Oral)   Resp 19   Ht 5\' 4"  (1.626 m)   Wt 84.4 kg   SpO2 98%   BMI 31.93 kg/m   Physical Exam Vitals and nursing note reviewed.  Constitutional:      General: She is not in acute distress.    Appearance: Normal appearance. She is ill-appearing.  HENT:     Head: Normocephalic and atraumatic.  Eyes:     General: No scleral icterus.    Conjunctiva/sclera: Conjunctivae normal.  Pulmonary:     Effort: Pulmonary effort is normal. No respiratory distress.  Skin:    Findings: No rash.  Neurological:     Mental Status: She is alert.  Psychiatric:     Comments: Patient began writhing around in the  stretcher complaining of pain.  When I told her that she would not get her methadone she stopped rolling around and became verbally aggressive towards staff.    ED Results / Procedures / Treatments   Labs (all labs ordered are listed, but only abnormal results are displayed) Labs Reviewed - No data to display  EKG None  Radiology No results found.  Procedures Procedures   Medications Ordered in ED Medications - No data to display  ED Course  I have reviewed the triage vital signs and the nursing notes.  Pertinent labs & imaging results that were available during my care of the patient were reviewed by me and considered in my medical decision making (see chart for details).    MDM Rules/Calculators/A&P  Upon telling the patient that we cannot legally give methadone she stated "the mother had MI here.  They sent me here."  She also asked: "What about the seizures, what the hell are you and I do about that."  We discussed that I am able to treat the symptoms of her withdrawals with medications however I am unable to give her opioid medications in this moment.  I discussed that we also may watch her to see if she has a seizure in the department to which she responded "why the ___ did not you will tell me that you were getting give me opioid medication when I checked in here."  She then ripped off her leads and began to get off the stretcher.  RN at bedside and will discharge the patient.  She is refusing to sign an AMA form.  Final Clinical Impression(s) / ED Diagnoses Final diagnoses:  Drug-seeking behavior    Rx / DC Orders    [place], [place], PA-C 04/30/21 [id number]    Rhae Hammock, PA-C 04/30/21 1040    Godfrey Pick, MD 04/30/21 1343

## 2021-04-30 NOTE — ED Notes (Signed)
Upon attempt to administer Zofran, patient remains in the restroom.

## 2021-04-30 NOTE — ED Triage Notes (Signed)
Patient states she needs methadone for her lupus. Patient also states she needs something for insomnia. Patient was seen already this AM for the same.

## 2021-05-22 ENCOUNTER — Encounter (HOSPITAL_COMMUNITY): Payer: Self-pay

## 2021-05-22 ENCOUNTER — Emergency Department (HOSPITAL_COMMUNITY): Payer: Medicaid Other

## 2021-05-22 ENCOUNTER — Inpatient Hospital Stay (HOSPITAL_COMMUNITY)
Admission: EM | Admit: 2021-05-22 | Discharge: 2021-05-25 | DRG: 917 | Disposition: A | Discharge status: Home / Self Care | Payer: Medicaid Other | Attending: Internal Medicine | Admitting: Internal Medicine

## 2021-05-22 ENCOUNTER — Inpatient Hospital Stay (HOSPITAL_COMMUNITY): Payer: Medicaid Other

## 2021-05-22 DIAGNOSIS — R9431 Abnormal electrocardiogram [ECG] [EKG]: Secondary | ICD-10-CM | POA: Diagnosis present

## 2021-05-22 DIAGNOSIS — T50904D Poisoning by unspecified drugs, medicaments and biological substances, undetermined, subsequent encounter: Secondary | ICD-10-CM | POA: Diagnosis not present

## 2021-05-22 DIAGNOSIS — F319 Bipolar disorder, unspecified: Secondary | ICD-10-CM | POA: Diagnosis present

## 2021-05-22 DIAGNOSIS — J9600 Acute respiratory failure, unspecified whether with hypoxia or hypercapnia: Secondary | ICD-10-CM

## 2021-05-22 DIAGNOSIS — J9601 Acute respiratory failure with hypoxia: Secondary | ICD-10-CM | POA: Diagnosis present

## 2021-05-22 DIAGNOSIS — Z79899 Other long term (current) drug therapy: Secondary | ICD-10-CM | POA: Diagnosis not present

## 2021-05-22 DIAGNOSIS — T50904A Poisoning by unspecified drugs, medicaments and biological substances, undetermined, initial encounter: Secondary | ICD-10-CM | POA: Diagnosis not present

## 2021-05-22 DIAGNOSIS — Z20822 Contact with and (suspected) exposure to covid-19: Secondary | ICD-10-CM | POA: Diagnosis present

## 2021-05-22 DIAGNOSIS — G928 Other toxic encephalopathy: Secondary | ICD-10-CM | POA: Diagnosis present

## 2021-05-22 DIAGNOSIS — F17210 Nicotine dependence, cigarettes, uncomplicated: Secondary | ICD-10-CM | POA: Diagnosis present

## 2021-05-22 DIAGNOSIS — Z91018 Allergy to other foods: Secondary | ICD-10-CM

## 2021-05-22 DIAGNOSIS — T50911A Poisoning by multiple unspecified drugs, medicaments and biological substances, accidental (unintentional), initial encounter: Secondary | ICD-10-CM | POA: Diagnosis present

## 2021-05-22 DIAGNOSIS — Z888 Allergy status to other drugs, medicaments and biological substances status: Secondary | ICD-10-CM | POA: Diagnosis not present

## 2021-05-22 DIAGNOSIS — M329 Systemic lupus erythematosus, unspecified: Secondary | ICD-10-CM | POA: Diagnosis present

## 2021-05-22 DIAGNOSIS — G8929 Other chronic pain: Secondary | ICD-10-CM | POA: Diagnosis present

## 2021-05-22 DIAGNOSIS — T50901A Poisoning by unspecified drugs, medicaments and biological substances, accidental (unintentional), initial encounter: Secondary | ICD-10-CM | POA: Diagnosis present

## 2021-05-22 DIAGNOSIS — F419 Anxiety disorder, unspecified: Secondary | ICD-10-CM | POA: Diagnosis present

## 2021-05-22 DIAGNOSIS — E876 Hypokalemia: Secondary | ICD-10-CM | POA: Diagnosis present

## 2021-05-22 LAB — BLOOD GAS, ARTERIAL
Acid-base deficit: 2.5 mmol/L — ABNORMAL HIGH (ref 0.0–2.0)
Bicarbonate: 22.8 mmol/L (ref 20.0–28.0)
FIO2: 60
MECHVT: 440 mL
O2 Saturation: 99.2 %
Patient temperature: 98.6
RATE: 1.6 resp/min
pCO2 arterial: 44 mmHg (ref 32.0–48.0)
pH, Arterial: 7.335 — ABNORMAL LOW (ref 7.350–7.450)
pO2, Arterial: 151 mmHg — ABNORMAL HIGH (ref 83.0–108.0)

## 2021-05-22 LAB — CBC WITH DIFFERENTIAL/PLATELET
Abs Immature Granulocytes: 0.04 10*3/uL (ref 0.00–0.07)
Basophils Absolute: 0.1 10*3/uL (ref 0.0–0.1)
Basophils Relative: 1 %
Eosinophils Absolute: 0.1 10*3/uL (ref 0.0–0.5)
Eosinophils Relative: 1 %
HCT: 39.1 % (ref 36.0–46.0)
Hemoglobin: 13 g/dL (ref 12.0–15.0)
Immature Granulocytes: 0 %
Lymphocytes Relative: 17 %
Lymphs Abs: 1.6 10*3/uL (ref 0.7–4.0)
MCH: 31.1 pg (ref 26.0–34.0)
MCHC: 33.2 g/dL (ref 30.0–36.0)
MCV: 93.5 fL (ref 80.0–100.0)
Monocytes Absolute: 0.6 10*3/uL (ref 0.1–1.0)
Monocytes Relative: 7 %
Neutro Abs: 7 10*3/uL (ref 1.7–7.7)
Neutrophils Relative %: 74 %
Platelets: 217 10*3/uL (ref 150–400)
RBC: 4.18 MIL/uL (ref 3.87–5.11)
RDW: 13.7 % (ref 11.5–15.5)
WBC: 9.4 10*3/uL (ref 4.0–10.5)
nRBC: 0 % (ref 0.0–0.2)

## 2021-05-22 LAB — RESP PANEL BY RT-PCR (FLU A&B, COVID) ARPGX2
Influenza A by PCR: NEGATIVE
Influenza B by PCR: NEGATIVE
SARS Coronavirus 2 by RT PCR: NEGATIVE

## 2021-05-22 LAB — ACETAMINOPHEN LEVEL: Acetaminophen (Tylenol), Serum: <10 ug/mL — ABNORMAL LOW (ref 10–30)

## 2021-05-22 LAB — URINALYSIS, ROUTINE W REFLEX MICROSCOPIC
Bilirubin Urine: NEGATIVE
Glucose, UA: NEGATIVE mg/dL
Ketones, ur: 20 mg/dL — AB
Nitrite: NEGATIVE
Protein, ur: NEGATIVE mg/dL
Specific Gravity, Urine: 1.017 (ref 1.005–1.030)
pH: 5 (ref 5.0–8.0)

## 2021-05-22 LAB — COMPREHENSIVE METABOLIC PANEL
ALT: 8 U/L (ref 0–44)
AST: 21 U/L (ref 15–41)
Albumin: 3.8 g/dL (ref 3.5–5.0)
Alkaline Phosphatase: 56 U/L (ref 38–126)
Anion gap: 7 (ref 5–15)
BUN: 17 mg/dL (ref 6–20)
CO2: 22 mmol/L (ref 22–32)
Calcium: 8.2 mg/dL — ABNORMAL LOW (ref 8.9–10.3)
Chloride: 105 mmol/L (ref 98–111)
Creatinine, Ser: 0.63 mg/dL (ref 0.44–1.00)
GFR, Estimated: >60 mL/min (ref >60)
Glucose, Bld: 118 mg/dL — ABNORMAL HIGH (ref 70–99)
Potassium: 2.9 mmol/L — ABNORMAL LOW (ref 3.5–5.1)
Sodium: 134 mmol/L — ABNORMAL LOW (ref 135–145)
Total Bilirubin: 0.9 mg/dL (ref 0.3–1.2)
Total Protein: 6.7 g/dL (ref 6.5–8.1)

## 2021-05-22 LAB — I-STAT CHEM 8, ED
BUN: 19 mg/dL (ref 6–20)
Calcium, Ion: 1.11 mmol/L — ABNORMAL LOW (ref 1.15–1.40)
Chloride: 101 mmol/L (ref 98–111)
Creatinine, Ser: 0.5 mg/dL (ref 0.44–1.00)
Glucose, Bld: 117 mg/dL — ABNORMAL HIGH (ref 70–99)
HCT: 38 % (ref 36.0–46.0)
Hemoglobin: 12.9 g/dL (ref 12.0–15.0)
Potassium: 3 mmol/L — ABNORMAL LOW (ref 3.5–5.1)
Sodium: 138 mmol/L (ref 135–145)
TCO2: 27 mmol/L (ref 22–32)

## 2021-05-22 LAB — ETHANOL: Alcohol, Ethyl (B): <10 mg/dL (ref <10)

## 2021-05-22 LAB — GLUCOSE, CAPILLARY: Glucose-Capillary: 111 mg/dL — ABNORMAL HIGH (ref 70–99)

## 2021-05-22 LAB — SALICYLATE LEVEL: Salicylate Lvl: <7 mg/dL — ABNORMAL LOW (ref 7.0–30.0)

## 2021-05-22 LAB — LACTIC ACID, PLASMA
Lactic Acid, Venous: 0.9 mmol/L (ref 0.5–1.9)
Lactic Acid, Venous: 0.5 mmol/L (ref 0.5–1.9)

## 2021-05-22 LAB — I-STAT BETA HCG BLOOD, ED (MC, WL, AP ONLY): I-stat hCG, quantitative: <5 m[IU]/mL (ref <5)

## 2021-05-22 LAB — TROPONIN I (HIGH SENSITIVITY)
Troponin I (High Sensitivity): 2 ng/L (ref <18)
Troponin I (High Sensitivity): 3 ng/L (ref <18)

## 2021-05-22 LAB — CBG MONITORING, ED: Glucose-Capillary: 121 mg/dL — ABNORMAL HIGH (ref 70–99)

## 2021-05-22 MED ORDER — LORAZEPAM 2 MG/ML IJ SOLN
2.0000 mg | Freq: Once | INTRAMUSCULAR | Status: AC
  Administered 2021-05-22: 2 mg via INTRAVENOUS
  Filled 2021-05-22: qty 1

## 2021-05-22 MED ORDER — MIDAZOLAM HCL 2 MG/2ML IJ SOLN
INTRAMUSCULAR | Status: AC
  Filled 2021-05-22: qty 2

## 2021-05-22 MED ORDER — SODIUM CHLORIDE 0.9 % IV SOLN: INTRAVENOUS | Status: DC

## 2021-05-22 MED ORDER — ETOMIDATE 2 MG/ML IV SOLN
INTRAVENOUS | Status: AC
  Administered 2021-05-22: 25 mg via INTRAVENOUS
  Filled 2021-05-22: qty 20

## 2021-05-22 MED ORDER — ROCURONIUM BROMIDE 10 MG/ML (PF) SYRINGE
PREFILLED_SYRINGE | INTRAVENOUS | Status: AC
  Administered 2021-05-22: 80 mg via INTRAVENOUS
  Filled 2021-05-22: qty 10

## 2021-05-22 MED ORDER — DOCUSATE SODIUM 100 MG PO CAPS: 100.0000 mg | ORAL_CAPSULE | Freq: Two times a day (BID) | ORAL | Status: DC | PRN

## 2021-05-22 MED ORDER — ETOMIDATE 2 MG/ML IV SOLN: 25.0000 mg | Freq: Once | INTRAVENOUS | Status: AC

## 2021-05-22 MED ORDER — SUCCINYLCHOLINE CHLORIDE 200 MG/10ML IV SOSY
PREFILLED_SYRINGE | INTRAVENOUS | Status: AC
  Filled 2021-05-22: qty 10

## 2021-05-22 MED ORDER — FENTANYL CITRATE PF 50 MCG/ML IJ SOSY
PREFILLED_SYRINGE | INTRAMUSCULAR | Status: AC
  Filled 2021-05-22: qty 2

## 2021-05-22 MED ORDER — POLYETHYLENE GLYCOL 3350 17 G PO PACK: 17.0000 g | PACK | Freq: Every day | ORAL | Status: DC | PRN

## 2021-05-22 MED ORDER — CHLORHEXIDINE GLUCONATE 0.12% ORAL RINSE (MEDLINE KIT)
15.0000 mL | Freq: Two times a day (BID) | OROMUCOSAL | Status: DC
  Administered 2021-05-22 – 2021-05-23 (×2): 15 mL via OROMUCOSAL

## 2021-05-22 MED ORDER — POTASSIUM CHLORIDE 20 MEQ PO PACK: 40.0000 meq | PACK | Freq: Every day | ORAL | Status: DC

## 2021-05-22 MED ORDER — KETAMINE HCL 50 MG/5ML IJ SOSY
PREFILLED_SYRINGE | INTRAMUSCULAR | Status: AC
  Filled 2021-05-22: qty 5

## 2021-05-22 MED ORDER — PANTOPRAZOLE 2 MG/ML SUSPENSION: 40.0000 mg | Freq: Every day | ORAL | Status: DC

## 2021-05-22 MED ORDER — SODIUM CHLORIDE 0.9 % IV BOLUS
1000.0000 mL | Freq: Once | INTRAVENOUS | Status: AC
  Administered 2021-05-22: 1000 mL via INTRAVENOUS

## 2021-05-22 MED ORDER — ROCURONIUM BROMIDE 10 MG/ML (PF) SYRINGE: 1.0000 mg/kg | PREFILLED_SYRINGE | Freq: Once | INTRAVENOUS | Status: AC

## 2021-05-22 MED ORDER — ORAL CARE MOUTH RINSE
15.0000 mL | OROMUCOSAL | Status: DC
  Administered 2021-05-22 – 2021-05-23 (×6): 15 mL via OROMUCOSAL

## 2021-05-22 MED ORDER — HEPARIN SODIUM (PORCINE) 5000 UNIT/ML IJ SOLN
5000.0000 [IU] | Freq: Three times a day (TID) | INTRAMUSCULAR | Status: DC
  Administered 2021-05-22 – 2021-05-25 (×8): 5000 [IU] via SUBCUTANEOUS
  Filled 2021-05-22 (×8): qty 1

## 2021-05-22 MED ORDER — PROPOFOL 1000 MG/100ML IV EMUL
5.0000 ug/kg/min | INTRAVENOUS | Status: DC
  Administered 2021-05-22: 35 ug/kg/min via INTRAVENOUS
  Administered 2021-05-22: 5 ug/kg/min via INTRAVENOUS
  Administered 2021-05-23: 55 ug/kg/min via INTRAVENOUS
  Administered 2021-05-23: 35 ug/kg/min via INTRAVENOUS
  Filled 2021-05-22 (×4): qty 100

## 2021-05-22 MED ORDER — POTASSIUM CHLORIDE 20 MEQ PO PACK
80.0000 meq | PACK | Freq: Once | ORAL | Status: AC
  Administered 2021-05-22: 80 meq
  Filled 2021-05-22: qty 4

## 2021-05-22 MED ORDER — CHLORHEXIDINE GLUCONATE CLOTH 2 % EX PADS
6.0000 | MEDICATED_PAD | Freq: Every day | CUTANEOUS | Status: DC
  Administered 2021-05-22 – 2021-05-23 (×2): 6 via TOPICAL

## 2021-05-22 NOTE — Progress Notes (Signed)
Pt transported to ICU on [facility] without complication.   

## 2021-05-22 NOTE — Plan of Care (Signed)

## 2021-05-22 NOTE — Progress Notes (Signed)
Notified MD [place] that patient was pended to [facility] ICU. MD Ander Slade ordered that patient can stay here at [facility] ICU for admission. Ordered changed per MD request.

## 2021-05-22 NOTE — ED Triage Notes (Addendum)
Pt BIB EMS. Pt was found in her hotel room unresponsive. Pt overdosed on unknown drug. Pts friends did CPR pt did come around a little. Fire department bagged pt. Pt had a total of 3mg  narcan. Hx of drug abuse. Pt is still lethargic at this time.

## 2021-05-22 NOTE — ED Notes (Signed)
Daughter of patient, [place] would like someone to call and update her. [phone]

## 2021-05-22 NOTE — ED Notes (Signed)
Bair hugger applied to pt due to temp of 94.9 via Temp [place].

## 2021-05-22 NOTE — ED Provider Notes (Signed)
[facility] DEPT Provider Note   CSN: [id number] Arrival date & time: 05/22/21  1704  LEVEL 5 CAVEAT - ALTERED MENTAL STATUS   History Chief Complaint  Patient presents with   Drug Overdose    Sonya King is a 40 y.o. female.  HPI 40 year old female presents with altered mental status.  She was found in her hotel room by friends and was unresponsive.  Friends did CPR and it seemed like she became a little more responsive.  Fire department arrived and she had no respirations.  She has been given 3 mg Narcan in total.  However EMS had to bag for about 10 minutes.  Currently she is breathing spontaneously and at a normal rate and is moving all 4 extremities but does not follow commands and still is altered.  There was a white substance found around her.  Past Medical History:  Diagnosis Date   Anxiety    Bipolar 1 disorder ([place])    Chronic abdominal pain    Drug-seeking behavior    Lupus ([place])    Ruptured lumbar disc     Patient Active Problem List   Diagnosis Date Noted   Overdose 05/22/2021   Tonsillitis with exudate 10/09/2020   Sinus pressure 10/09/2020   Cystitis, chronic 09/21/2016    Past Surgical History:  Procedure Laterality Date   ABDOMINAL HYSTERECTOMY     CHOLECYSTECTOMY     TONSILLECTOMY       OB History     Gravida  4   Para  4   Term  4   Preterm  0   AB  0   Living  0      SAB  0   IAB  0   Ectopic  0   Multiple  0   Live Births  4           History reviewed. No pertinent family history.  Social History   Tobacco Use   Smoking status: Every Day    Packs/day: 0.50    Types: Cigarettes   Smokeless tobacco: Never  Vaping Use   Vaping Use: Never used  Substance Use Topics   Alcohol use: No   Drug use: No    Home Medications Prior to Admission medications   Medication Sig Start Date End Date Taking? Authorizing Provider  chlorhexidine (PERIDEX) 0.12 % solution 15 mL swish and  spit bid 10/29/20   Melynda Ripple, MD  clonazePAM (KLONOPIN) 1 MG tablet Take 3 mg by mouth daily. 06/21/19   [provider]  clonazePAM (KLONOPIN) 2 MG tablet Take 2 mg by mouth 4 (four) times daily as needed. 09/25/20   [provider]  cyclobenzaprine (FLEXERIL) 10 MG tablet Take 1 tablet (10 mg total) by mouth 2 (two) times daily as needed for muscle spasms. 12/14/19   Pattricia Boss, MD  DULoxetine (CYMBALTA) 30 MG capsule Take 90 mg by mouth daily. 07/07/20   [provider]  fluticasone (FLONASE) 50 MCG/ACT nasal spray Place 2 sprays into both nostrils daily. 10/09/20   Mayers, Cari S, PA-C  hydrOXYzine (ATARAX/VISTARIL) 25 MG tablet Take 1 tablet (25 mg total) by mouth every 6 (six) hours. 04/30/21   Redwine, Madison A, PA-C  lidocaine (XYLOCAINE) 2 % solution Use as directed 10 mLs in the mouth or throat every 6 (six) hours as needed for mouth pain. Hold in mouth and spit. Do not swallow. 10/29/20   Melynda Ripple, MD  ondansetron (ZOFRAN ODT) 4 MG  disintegrating tablet Take 1 tablet (4 mg total) by mouth every 8 (eight) hours as needed for nausea or vomiting. 07/12/20   Antonietta Breach, PA-C  ondansetron (ZOFRAN) 4 MG tablet Take 1 tablet (4 mg total) by mouth every 6 (six) hours. 04/30/21   Redwine, Madison A, PA-C  pantoprazole (PROTONIX) 40 MG tablet Take 1 tablet (40 mg total) by mouth daily. Patient not taking: Reported on 12/14/2019 06/29/19   Lajean Saver, MD  SUBOXONE 8-2 MG FILM Place under the tongue 3 (three) times daily. 10/06/20   [provider]  zolpidem (AMBIEN) 10 MG tablet Take 10 mg by mouth at bedtime.  06/21/19   [provider]    Allergies    Avocado and Benadryl [diphenhydramine hcl]  Review of Systems   Review of Systems  Unable to perform ROS: Mental status change   Physical Exam Updated Vital Signs BP 108/79    Pulse 69    Temp (!) 94.9 F (34.9 C)    Resp 16    Ht 5\' 4"  (1.626 m)    Wt 84.4 kg Comment: from 04/30/21    SpO2 100%    BMI 31.93 kg/m   Physical Exam Vitals and nursing note reviewed.  Constitutional:      Appearance: She is well-developed. She is ill-appearing.  HENT:     Head: Normocephalic and atraumatic.     Right Ear: External ear normal.     Left Ear: External ear normal.     Nose: Nose normal.  Eyes:     General:        Right eye: No discharge.        Left eye: No discharge.     Comments: miotic  Cardiovascular:     Rate and Rhythm: Normal rate and regular rhythm.     Heart sounds: Normal heart sounds.  Pulmonary:     Effort: Pulmonary effort is normal.     Breath sounds: Normal breath sounds.  Abdominal:     Palpations: Abdomen is soft.     Tenderness: There is no abdominal tenderness.  Musculoskeletal:     Cervical back: Neck supple. No rigidity.  Skin:    General: Skin is warm and dry.     Comments: Patient soiled self and is covered in feces  Neurological:     Mental Status: She is lethargic.     GCS: GCS eye subscore is 2. GCS verbal subscore is 1. GCS motor subscore is 5.     Comments: Patient is moving all 4 extremities but the movement seems irregular and she does not follow commands.  Has good tone in all 4 extremities.  She is lethargic.  Does not open her eyes.  Psychiatric:        Mood and Affect: Mood is not anxious.    ED Results / Procedures / Treatments   Labs (all labs ordered are listed, but only abnormal results are displayed) Labs Reviewed  COMPREHENSIVE METABOLIC PANEL - Abnormal; Notable for the following components:      Result Value   Sodium 134 (*)    Potassium 2.9 (*)    Glucose, Bld 118 (*)    Calcium 8.2 (*)    All other components within normal limits  I-STAT CHEM 8, ED - Abnormal; Notable for the following components:   Potassium 3.0 (*)    Glucose, Bld 117 (*)    Calcium, Ion 1.11 (*)    All other components within normal limits  CBG MONITORING,  ED - Abnormal; Notable for the following components:   Glucose-Capillary 121 (*)     All other components within normal limits  RESP PANEL BY RT-PCR (FLU A&B, COVID) ARPGX2  CBC WITH DIFFERENTIAL/PLATELET  RAPID URINE DRUG SCREEN, HOSP PERFORMED  URINALYSIS, ROUTINE W REFLEX MICROSCOPIC  LACTIC ACID, PLASMA  LACTIC ACID, PLASMA  ACETAMINOPHEN LEVEL  ETHANOL  SALICYLATE LEVEL  HIV ANTIBODY (ROUTINE TESTING W REFLEX)  CBC  BASIC METABOLIC PANEL  MAGNESIUM  PHOSPHORUS  BLOOD GAS, ARTERIAL  I-STAT BETA HCG BLOOD, ED ([facility], [facility], [facility] ONLY)  TROPONIN I (HIGH SENSITIVITY)  TROPONIN I (HIGH SENSITIVITY)    EKG EKG Interpretation  Date/Time:  Friday May 22 2021 17:43:53 EST Ventricular Rate:  103 PR Interval:  157 QRS Duration: 100 QT Interval:  398 QTC Calculation: 521 R Axis:   87 Text Interpretation: Sinus tachycardia Borderline T abnormalities, anterior leads Prolonged QT interval Confirmed by Sherwood Gambler [phone]) on 05/22/2021 6:09:37 PM  Radiology CT HEAD WO CONTRAST (5MM)  Result Date: 05/22/2021 CLINICAL DATA:  Altered mental status EXAM: CT HEAD WITHOUT CONTRAST TECHNIQUE: Contiguous axial images were obtained from the base of the skull through the vertex without intravenous contrast. COMPARISON:  02/22/2021 FINDINGS: Brain: No evidence of acute infarction, hemorrhage, hydrocephalus, extra-axial collection or mass lesion/mass effect. Vascular: No hyperdense vessel or unexpected calcification. Skull: Normal. Negative for fracture or focal lesion. Sinuses/Orbits: No acute finding. Other: None. IMPRESSION: No acute intracranial abnormality noted. Electronically Signed   By: Inez Catalina M.D.   On: 05/22/2021 18:44   DG Chest Portable 1 View  Result Date: 05/22/2021 CLINICAL DATA:  Altered mental status EXAM: PORTABLE CHEST 1 VIEW COMPARISON:  08/08/2005 FINDINGS: Endotracheal tube tip is just above the right mainstem bronchus. No focal opacity, pleural effusion or pneumothorax. Normal cardiomediastinal silhouette. Additional tubing visualized over the  thoracic inlet. IMPRESSION: 1. Endotracheal tube tip just above the right mainstem bronchus 2. Possible esophageal tubing over the thoracic inlet, correlate with direct inspection Critical Value/emergent results were called by telephone at the time of interpretation on 05/22/2021 at 5:59 pm to provider Sherwood Gambler , who verbally acknowledged these results. Electronically Signed   By: Donavan Foil M.D.   On: 05/22/2021 17:59   DG Abd Portable 1 View  Result Date: 05/22/2021 CLINICAL DATA:  verify OG placement EXAM: PORTABLE ABDOMEN - 1 VIEW COMPARISON:  None. FINDINGS: Orogastric tube tip overlies the distal stomach/proximal duodenum, side port overlies body of the stomach. Nonobstructive bowel gas pattern. The lung bases appear clear. IMPRESSION: Orogastric tube side port overlies the body of the stomach, tip overlies the distal stomach or proximal duodenum. Electronically Signed   By: Maurine Simmering M.D.   On: 05/22/2021 20:05    Procedures Procedure Name: Intubation Date/Time: 05/22/2021 5:44 PM Performed by: Sherwood Gambler, MD Pre-anesthesia Checklist: Patient identified, Patient being monitored, Emergency Drugs available, Timeout performed and Suction available Oxygen Delivery Method: Non-rebreather mask Preoxygenation: Pre-oxygenation with 100% oxygen Induction Type: Rapid sequence Ventilation: Mask ventilation without difficulty Laryngoscope Size: Glidescope and 3 Grade View: Grade I Tube size: 7.5 mm Number of attempts: 1 Airway Equipment and Method: Video-laryngoscopy Placement Confirmation: ETT inserted through vocal cords under direct vision, CO2 detector and Breath sounds checked- equal and bilateral Tube secured with: ETT holder Dental Injury: Teeth and Oropharynx as per pre-operative assessment  Difficulty Due To: Difficult Airway- due to anterior larynx Comments: Tube retracted after CXR seen    .Critical Care Performed by: Sherwood Gambler, MD Authorized by: Regenia Skeeter,  Nicki Reaper, MD   Critical care provider statement:    Critical care time (minutes):  35   Critical care time was exclusive of:  Separately billable procedures and treating other patients   Critical care was necessary to treat or prevent imminent or life-threatening deterioration of the following conditions:  Respiratory failure and CNS failure or compromise   Critical care was time spent personally by me on the following activities:  Development of treatment plan with patient or surrogate, discussions with consultants, evaluation of patient's response to treatment, examination of patient, ordering and review of laboratory studies, ordering and review of radiographic studies, ordering and performing treatments and interventions, pulse oximetry, re-evaluation of patient's condition, review of old charts and ventilator management   Medications Ordered in ED Medications  propofol (DIPRIVAN) 1000 MG/100ML infusion (45 mcg/kg/min  84.4 kg Intravenous Rate/Dose Change 05/22/21 1941)  docusate sodium (COLACE) capsule 100 mg (has no administration in time range)  polyethylene glycol (MIRALAX / GLYCOLAX) packet 17 g (has no administration in time range)  heparin injection 5,000 Units (has no administration in time range)  pantoprazole sodium (PROTONIX) 40 mg/20 mL oral suspension 40 mg (has no administration in time range)  0.9 %  sodium chloride infusion ( Intravenous New Bag/Given 05/22/21 1932)  potassium chloride (KLOR-CON) packet 40 mEq (has no administration in time range)  sodium chloride 0.9 % bolus 1,000 mL (1,000 mLs Intravenous New Bag/Given 05/22/21 1931)  etomidate (AMIDATE) injection 25 mg (25 mg Intravenous Given 05/22/21 1738)  rocuronium bromide 10 mg/mL (PF) syringe (80 mg Intravenous Given 05/22/21 1739)  LORazepam (ATIVAN) injection 2 mg (2 mg Intravenous Given 05/22/21 1748)    ED Course  I have reviewed the triage vital signs and the nursing notes.  Pertinent labs & imaging results  that were available during my care of the patient were reviewed by me and considered in my medical decision making (see chart for details).    MDM Rules/Calculators/A&P                         Patient is not following commands. There is concern about her protecting her airway. I'm concerned about anoxic brain injury, and given her low GCS (8) and need for critical interventions which she is impeding with her diffuse movements/AMS I decided to intubate. Hemodynamically she is otherwise stable. Critical care was consulted and will admit. I discussed patient's critical condition with her daughter over the phone.    Final Clinical Impression(s) / ED Diagnoses Final diagnoses:  Acute respiratory failure, unspecified whether with hypoxia or hypercapnia (HCC)  Overdose of undetermined intent, initial encounter    Rx / DC Orders ED Discharge Orders     None        Sherwood Gambler, MD 05/22/21 2010

## 2021-05-22 NOTE — Progress Notes (Signed)
ET tube was pulled back 2cm post xray per Dr. Regenia Skeeter.  RT will continue to monitor

## 2021-05-22 NOTE — H&P (Signed)
NAME:  Sonya King, MRN:  [id number], DOB:  [birth year], LOS: 0 ADMISSION DATE:  05/22/2021, CONSULTATION DATE:  12/16 REFERRING MD: Dr. Regenia Skeeter, CHIEF COMPLAINT:  Found Down   History of Present Illness:  40 y/o F who presented to [facility] ER on 12/16 after being found down in a hotel room by friends unresponsive.   The patient reportedly lives in a hotel.  Her last known normal is unknown.  Reportedly her friends did CPR for an unknown period of time after finding her and EMS was called.  On Fire Dept arrival, she was unresponsive and required BVM assist for ventilation.  She was given 3mg  Narcan with improvement in respiratory status, flailing but no improvement in sensorium.   On arrival to the ER, her GCS was 8.  She was incontinent of bowel/bladder. She moved all four extremities on arrival but not to command.  The patient required intubation for airway protection in the setting of suspected overdose.  ETT adjusted after intubation (pulled back 2cm) . CXR was negative for acute process.  Labs, viral studies, cultures and CT head pending.    PCCM called for ICU admission.   Pertinent  Medical History  Lupus  Anxiety  Bipolar Disorder  Chronic Abdominal Pain Drug Seeking Behavior  Ruptured Lumbar Disc  Significant Hospital Events: Including procedures, antibiotic start and stop dates in addition to other pertinent events   12/16 Admit after being found down in a hotel unresponsive, suspected overdose. Intubated.   Interim History / Subjective:  As above   Objective   Height 5\' 4"  (1.626 m), weight 84.4 kg, SpO2 100 %.    Vent Mode: PRVC FiO2 (%):  [60 %] 60 % Set Rate:  [16 bmp] 16 bmp Vt Set:  [440 mL] 440 mL PEEP:  [5 cmH20] 5 cmH20 Plateau Pressure:  [13 cmH20] 13 cmH20  No intake or output data in the 24 hours ending 05/22/21 1805 Filed Weights   05/22/21 1700  Weight: 84.4 kg    Examination: General: critically ill appearing adult female lying on ER stretcher  in NAD HENT: MM pink/moist, no upper teeth, poor dentition on bottom, ETT, anicteric, pupils 51mm Lungs: non-labored at rest, lungs bilaterally clear with good air entry  Cardiovascular: S1S2 RRR, no m/r/g  Abdomen: soft, non-distended, bsx4 active Extremities: warm/dry, multiple bruises on LE's of varying stages, tattoo on left foot Neuro: sedate/post paralytics on vent  Resolved Hospital Problem list     Assessment & Plan:   Suspected Overdose  Acute Encephalopathy, suspect Toxic, +/- Metabolic  Hx of substance abuse.  Respiratory effort responded to Narcan in field.  GCS 8 on arrival.  -admit to ICU -CT head  -assess UDS, ETOH, salicylate -assess troponin   Acute Hypoxic Respiratory Failure secondary to Above Intubated for airway protection, CXR clear on admit -PRVC 8cc/kg  -wean PEEP / FiO2 for sats >90% -assess ABG post intubation  -daily assessment for SBT / WUA  -assess COVID screening  -PAD protocol for RASS goal of 0 to -1  -propofol with PRN fentanyl for sedation while intubated  At Risk AKI  Hypokalemia  -Trend BMP / urinary output -Replace electrolytes as indicated -Avoid nephrotoxic agents, ensure adequate renal perfusion  Chronic Pain Chronic Benzo, Suboxone Use  -hold home flexeril, cymbalta, ambien, suboxone, klonopin, reassess for restart in am  Prolonged QT Admit EKG reviewed, ST with prolonged QTc (521) -tele monitoring  -avoid QT prolonging agents   Hx Lupus  -supportive care  Best Practice (right click and "Reselect all SmartList Selections" daily)  Diet/type: NPO DVT prophylaxis: LMWH GI prophylaxis: PPI Lines: N/A Foley:  N/A Code Status:  full code Last date of multidisciplinary goals of care discussion: pending   Labs   CBC: No results for input(s): WBC, NEUTROABS, HGB, HCT, MCV, PLT in the last 168 hours.  Basic Metabolic Panel: No results for input(s): NA, K, CL, CO2, GLUCOSE, BUN, CREATININE, CALCIUM, MG, PHOS in the last 168  hours. GFR: CrCl cannot be calculated (Patient's most recent lab result is older than the maximum 21 days allowed.). No results for input(s): PROCALCITON, WBC, LATICACIDVEN in the last 168 hours.  Liver Function Tests: No results for input(s): AST, ALT, ALKPHOS, BILITOT, PROT, ALBUMIN in the last 168 hours. No results for input(s): LIPASE, AMYLASE in the last 168 hours. No results for input(s): AMMONIA in the last 168 hours.  ABG    Component Value Date/Time   HCO3 18.7 (L) 01/20/2007 1332   TCO2 29 06/12/2017 1541   ACIDBASEDEF 5.0 (H) 01/20/2007 1332     Coagulation Profile: No results for input(s): INR, PROTIME in the last 168 hours.  Cardiac Enzymes: No results for input(s): CKTOTAL, CKMB, CKMBINDEX, TROPONINI in the last 168 hours.  HbA1C: No results found for: HGBA1C  CBG: Recent Labs  Lab 05/22/21 1733  GLUCAP 121*    Review of Systems:   Unable to complete as patient is altered on mechanical ventilation. No family at bedside.   Past Medical History:  She,  has a past medical history of Anxiety, Bipolar 1 disorder ([place]), Chronic abdominal pain, Drug-seeking behavior, Lupus ([place]), and Ruptured lumbar disc.   Surgical History:   Past Surgical History:  Procedure Laterality Date   ABDOMINAL HYSTERECTOMY     CHOLECYSTECTOMY     TONSILLECTOMY       Social History:   reports that she has been smoking cigarettes. She has been smoking an average of .5 packs per day. She has never used smokeless tobacco. She reports that she does not drink alcohol and does not use drugs.   Family History:  Her family history is not on file.   Allergies Allergies  Allergen Reactions   Avocado Itching and Swelling    Throat swelling    Benadryl [Diphenhydramine Hcl] Other (See Comments)    Makes her legs kick     Home Medications  Prior to Admission medications   Medication Sig Start Date End Date Taking? Authorizing Provider  chlorhexidine (PERIDEX) 0.12 % solution 15  mL swish and spit bid 10/29/20   Melynda Ripple, MD  clonazePAM (KLONOPIN) 1 MG tablet Take 3 mg by mouth daily. 06/21/19   [provider]  clonazePAM (KLONOPIN) 2 MG tablet Take 2 mg by mouth 4 (four) times daily as needed. 09/25/20   [provider]  cyclobenzaprine (FLEXERIL) 10 MG tablet Take 1 tablet (10 mg total) by mouth 2 (two) times daily as needed for muscle spasms. 12/14/19   Pattricia Boss, MD  DULoxetine (CYMBALTA) 30 MG capsule Take 90 mg by mouth daily. 07/07/20   [provider]  fluticasone (FLONASE) 50 MCG/ACT nasal spray Place 2 sprays into both nostrils daily. 10/09/20   Mayers, Cari S, PA-C  hydrOXYzine (ATARAX/VISTARIL) 25 MG tablet Take 1 tablet (25 mg total) by mouth every 6 (six) hours. 04/30/21   Redwine, Madison A, PA-C  lidocaine (XYLOCAINE) 2 % solution Use as directed 10 mLs in the mouth or throat every 6 (six) hours as  needed for mouth pain. Hold in mouth and spit. Do not swallow. 10/29/20   Melynda Ripple, MD  ondansetron (ZOFRAN ODT) 4 MG disintegrating tablet Take 1 tablet (4 mg total) by mouth every 8 (eight) hours as needed for nausea or vomiting. 07/12/20   Antonietta Breach, PA-C  ondansetron (ZOFRAN) 4 MG tablet Take 1 tablet (4 mg total) by mouth every 6 (six) hours. 04/30/21   Redwine, Madison A, PA-C  pantoprazole (PROTONIX) 40 MG tablet Take 1 tablet (40 mg total) by mouth daily. Patient not taking: Reported on 12/14/2019 06/29/19   Lajean Saver, MD  SUBOXONE 8-2 MG FILM Place under the tongue 3 (three) times daily. 10/06/20   [provider]  zolpidem (AMBIEN) 10 MG tablet Take 10 mg by mouth at bedtime.  06/21/19   [provider]     Critical care time: 68 minutes     Noe Gens, MSN, APRN, NP-C, AGACNP-BC [facility] 05/22/2021, 6:05 PM   Please see [email or web address] for pager details.   From 7A-7P if no response, please call [phone] After hours, please call ELink [phone]

## 2021-05-22 NOTE — Progress Notes (Signed)
Temperature to [facility] adjusted.

## 2021-05-22 NOTE — Progress Notes (Signed)
[facility] Progress Note Patient Name: Sonya King DOB: January 27, 1981 MRN: [id number]   Date of Service  05/22/2021  HPI/Events of Note  Pt in ICU; Kcl on repeat was only 3  eICU Interventions  Supplementation was ordered as 73mEq; and not yet give.  I changed it to 7mEq     Intervention Category Evaluation Type: New Patient Evaluation  Tilden Dome 05/22/2021, 9:01 PM

## 2021-05-23 ENCOUNTER — Inpatient Hospital Stay (HOSPITAL_COMMUNITY): Payer: Medicaid Other

## 2021-05-23 LAB — BASIC METABOLIC PANEL
Anion gap: 8 (ref 5–15)
BUN: 10 mg/dL (ref 6–20)
CO2: 23 mmol/L (ref 22–32)
Calcium: 8.1 mg/dL — ABNORMAL LOW (ref 8.9–10.3)
Chloride: 107 mmol/L (ref 98–111)
Creatinine, Ser: 0.47 mg/dL (ref 0.44–1.00)
GFR, Estimated: >60 mL/min (ref >60)
Glucose, Bld: 80 mg/dL (ref 70–99)
Potassium: 3.9 mmol/L (ref 3.5–5.1)
Sodium: 138 mmol/L (ref 135–145)

## 2021-05-23 LAB — CBC
HCT: 36.9 % (ref 36.0–46.0)
Hemoglobin: 12 g/dL (ref 12.0–15.0)
MCH: 30.6 pg (ref 26.0–34.0)
MCHC: 32.5 g/dL (ref 30.0–36.0)
MCV: 94.1 fL (ref 80.0–100.0)
Platelets: 210 10*3/uL (ref 150–400)
RBC: 3.92 MIL/uL (ref 3.87–5.11)
RDW: 13.8 % (ref 11.5–15.5)
WBC: 7.3 10*3/uL (ref 4.0–10.5)
nRBC: 0 % (ref 0.0–0.2)

## 2021-05-23 LAB — GLUCOSE, CAPILLARY
Glucose-Capillary: 76 mg/dL (ref 70–99)
Glucose-Capillary: 80 mg/dL (ref 70–99)
Glucose-Capillary: 81 mg/dL (ref 70–99)
Glucose-Capillary: 86 mg/dL (ref 70–99)
Glucose-Capillary: 87 mg/dL (ref 70–99)
Glucose-Capillary: 63 mg/dL — ABNORMAL LOW (ref 70–99)

## 2021-05-23 LAB — RAPID URINE DRUG SCREEN, HOSP PERFORMED
Amphetamines: POSITIVE — AB
Barbiturates: NOT DETECTED
Benzodiazepines: POSITIVE — AB
Cocaine: POSITIVE — AB
Opiates: NOT DETECTED
Tetrahydrocannabinol: NOT DETECTED

## 2021-05-23 LAB — HIV ANTIBODY (ROUTINE TESTING W REFLEX): HIV Screen 4th Generation wRfx: NONREACTIVE

## 2021-05-23 LAB — MRSA NEXT GEN BY PCR, NASAL: MRSA by PCR Next Gen: NOT DETECTED

## 2021-05-23 LAB — MAGNESIUM: Magnesium: 1.9 mg/dL (ref 1.7–2.4)

## 2021-05-23 LAB — PHOSPHORUS: Phosphorus: 3.3 mg/dL (ref 2.5–4.6)

## 2021-05-23 MED ORDER — LACTATED RINGERS IV BOLUS
500.0000 mL | Freq: Once | INTRAVENOUS | Status: AC
  Administered 2021-05-23: 500 mL via INTRAVENOUS

## 2021-05-23 MED ORDER — SODIUM CHLORIDE 0.9 % IV SOLN: INTRAVENOUS | Status: DC | PRN

## 2021-05-23 MED ORDER — PANTOPRAZOLE SODIUM 40 MG IV SOLR
40.0000 mg | Freq: Every day | INTRAVENOUS | Status: DC
  Administered 2021-05-23: 40 mg via INTRAVENOUS
  Filled 2021-05-23: qty 40

## 2021-05-23 MED ORDER — DEXMEDETOMIDINE HCL IN NACL 400 MCG/100ML IV SOLN: 0.4000 ug/kg/h | INTRAVENOUS | Status: DC

## 2021-05-23 MED ORDER — ORAL CARE MOUTH RINSE
15.0000 mL | Freq: Two times a day (BID) | OROMUCOSAL | Status: DC
  Administered 2021-05-23 – 2021-05-25 (×5): 15 mL via OROMUCOSAL

## 2021-05-23 MED ORDER — LORAZEPAM 2 MG/ML IJ SOLN: 1.0000 mg | INTRAMUSCULAR | Status: DC | PRN

## 2021-05-23 MED ORDER — CLONAZEPAM 1 MG PO TABS: 1.0000 mg | ORAL_TABLET | Freq: Two times a day (BID) | ORAL | Status: DC

## 2021-05-23 MED ORDER — CLONAZEPAM 1 MG PO TABS
2.0000 mg | ORAL_TABLET | Freq: Two times a day (BID) | ORAL | Status: DC
  Administered 2021-05-23 – 2021-05-25 (×4): 2 mg via ORAL
  Filled 2021-05-23 (×4): qty 2

## 2021-05-23 MED ORDER — MAGNESIUM SULFATE 2 GM/50ML IV SOLN
2.0000 g | Freq: Once | INTRAVENOUS | Status: AC
  Administered 2021-05-23: 2 g via INTRAVENOUS
  Filled 2021-05-23: qty 50

## 2021-05-23 MED ORDER — LACTATED RINGERS IV SOLN: INTRAVENOUS | Status: DC

## 2021-05-23 MED ORDER — LORAZEPAM 2 MG/ML IJ SOLN: 1.0000 mg | INTRAMUSCULAR | Status: DC

## 2021-05-23 NOTE — Procedures (Signed)
Extubation Procedure Note  Patient Details:   Name: Sonya King DOB: 13-Jul-1980 MRN: [id number]   Airway Documentation:  Airway 7.5 mm (Active)  Secured at (cm) 23 cm 05/23/21 0800  Measured From Lips 05/23/21 0800  Secured Location Left 05/23/21 0800  Secured By [employer] 05/23/21 0800  Tube Holder Repositioned Yes 05/23/21 0730  Prone position No 05/23/21 0730  Cuff Pressure (cm H2O) Green OR 18-26 CmH2O 05/23/21 0730  Site Condition Dry 05/23/21 0730   Vent end date: (not recorded) Vent end time: (not recorded)   Evaluation  O2 sats: stable throughout Complications: No apparent complications Patient did tolerate procedure well. Bilateral Breath Sounds: Rhonchi   Yes Patient extubated to a 4L [place]. Tolerating well at this time  Joellyn Rued 05/23/2021, 10:14 AM

## 2021-05-23 NOTE — Progress Notes (Signed)
Nurse called elink to verify the order placed by provider [place]. Nurse noted that the provider placed neuro checks and other ciwa orders. Primary nurse is looking for clarification from elink ([place]). Will continue to monitor.

## 2021-05-23 NOTE — Progress Notes (Signed)
Nurse called to Sonya King) Lysbeth Galas to follow up with patient's diet and to see if patient could be placed on a clear liquid diet. Nurse performed swallow study at bedside. Patient was able to swallow liquids without any difficulty (no coughing or choking noted during study). Will continue to monitor.

## 2021-05-23 NOTE — Progress Notes (Signed)
Called E-Link regarding blood sugar trends. Left a message with the secretary. Will call again in a few minutes.

## 2021-05-23 NOTE — Progress Notes (Signed)
NAME:  Sonya King, MRN:  [id number], DOB:  04-03-1981, LOS: 1 ADMISSION DATE:  05/22/2021, CONSULTATION DATE:  12/16 REFERRING MD: Dr. Regenia Skeeter, CHIEF COMPLAINT:  Found Down   History of Present Illness:  40 y/o F who presented to [facility] ER on 12/16 after being found down in a hotel room by friends unresponsive.   The patient reportedly lives in a hotel.  Her last known normal is unknown.  Reportedly her friends did CPR for an unknown period of time after finding her and EMS was called.  On Fire Dept arrival, she was unresponsive and required BVM assist for ventilation.  She was given 3mg  Narcan with improvement in respiratory status, flailing but no improvement in sensorium.   On arrival to the ER, her GCS was 8.  She was incontinent of bowel/bladder. She moved all four extremities on arrival but not to command.  The patient required intubation for airway protection in the setting of suspected overdose.  ETT adjusted after intubation (pulled back 2cm) . CXR was negative for acute process.  Labs, viral studies, cultures and CT head pending.    PCCM called for ICU admission.   Pertinent  Medical History  Lupus  Anxiety  Bipolar Disorder  Chronic Abdominal Pain Drug Seeking Behavior  Ruptured Lumbar Disc  Significant Hospital Events: Including procedures, antibiotic start and stop dates in addition to other pertinent events   12/16 Admit after being found down in a hotel unresponsive, suspected overdose. Intubated.   Interim History / Subjective:  As above   Objective   Blood pressure 115/67, pulse 94, temperature 98.6 F (37 C), temperature source Esophageal, resp. rate 17, height 5\' 4"  (1.626 m), weight 66.9 kg, SpO2 100 %.    Vent Mode: PRVC FiO2 (%):  [45 %-60 %] 45 % Set Rate:  [16 bmp] 16 bmp Vt Set:  [440 mL] 440 mL PEEP:  [5 cmH20] 5 cmH20 Plateau Pressure:  [12 cmH20-15 cmH20] 12 cmH20   Intake/Output Summary (Last 24 hours) at 05/23/2021 7262 Last data filed at  05/23/2021 0800 Gross per 24 hour  Intake 1302.41 ml  Output 1192 ml  Net 110.41 ml   Filed Weights   05/22/21 1700 05/22/21 2100 05/23/21 0500  Weight: 84.4 kg 66.9 kg 66.9 kg    Examination: General: critically ill appearing adult female lying on ER stretcher in NAD HENT: MM pink/moist, no upper teeth, poor dentition on bottom, ETT, anicteric, pupils 16mm Lungs: non-labored at rest, lungs bilaterally clear with good air entry  Cardiovascular: S1S2 RRR, no m/r/g  Abdomen: soft, non-distended, bsx4 active Extremities: warm/dry, multiple bruises on LE's of varying stages, tattoo on left foot Neuro: sedate/post paralytics on vent  Resolved Hospital Problem list     Assessment & Plan:   Drug overdose -U tox positive for amphetamines, benzodiazepines, cocaine  Metabolic encephalopathy -CT head negative -Continue supportive measures  Acute hypoxic respiratory failure secondary to drug overdose -Wean off ventilator as tolerated -Weaning per protocol -Wean sedation as tolerated  At risk AKI -Continue to trend BMP -Replete electrolytes -Maintain renal perfusion  History of chronic pain -Home medications on hold  Prolonged QT -Continue to monitor  History of lupus -Supportive measures  With multiple lesions in the urinary toxicology -Risk of deterioration remains high  Best Practice (right click and "Reselect all SmartList Selections" daily)  Diet/type: NPO DVT prophylaxis: LMWH GI prophylaxis: PPI Lines: N/A Foley:  N/A Code Status:  full code Last date of multidisciplinary goals of care discussion:  pending   Labs   CBC: Recent Labs  Lab 05/22/21 1714 05/22/21 1808 05/23/21 0252  WBC 9.4  --  7.3  NEUTROABS 7.0  --   --   HGB 13.0 12.9 12.0  HCT 39.1 38.0 36.9  MCV 93.5  --  94.1  PLT 217  --  774    Basic Metabolic Panel: Recent Labs  Lab 05/22/21 1714 05/22/21 1808 05/23/21 0252  NA 134* 138 138  K 2.9* 3.0* 3.9  CL 105 101 107  CO2 22   --  23  GLUCOSE 118* 117* 80  BUN 17 19 10   CREATININE 0.63 0.50 0.47  CALCIUM 8.2*  --  8.1*  MG  --   --  1.9  PHOS  --   --  3.3   GFR: Estimated Creatinine Clearance: 88.8 mL/min (by C-G formula based on SCr of 0.47 mg/dL). Recent Labs  Lab 05/22/21 1714 05/22/21 2101 05/22/21 2237 05/23/21 0252  WBC 9.4  --   --  7.3  LATICACIDVEN  --  0.9 0.5  --     Liver Function Tests: Recent Labs  Lab 05/22/21 1714  AST 21  ALT 8  ALKPHOS 56  BILITOT 0.9  PROT 6.7  ALBUMIN 3.8   No results for input(s): LIPASE, AMYLASE in the last 168 hours. No results for input(s): AMMONIA in the last 168 hours.  ABG    Component Value Date/Time   PHART 7.335 (L) 05/22/2021 1955   PCO2ART 44.0 05/22/2021 1955   PO2ART 151 (H) 05/22/2021 1955   HCO3 22.8 05/22/2021 1955   TCO2 27 05/22/2021 1808   ACIDBASEDEF 2.5 (H) 05/22/2021 1955   O2SAT 99.2 05/22/2021 1955     Coagulation Profile: No results for input(s): INR, PROTIME in the last 168 hours.  Cardiac Enzymes: No results for input(s): CKTOTAL, CKMB, CKMBINDEX, TROPONINI in the last 168 hours.  HbA1C: No results found for: HGBA1C  CBG: Recent Labs  Lab 05/22/21 1733 05/22/21 2313 05/23/21 0341 05/23/21 0616 05/23/21 0746  GLUCAP 121* 111* 76 80 81    Review of Systems:   Unable to complete as patient is altered on mechanical ventilation. No family at bedside.   Past Medical History:  She,  has a past medical history of Anxiety, Bipolar 1 disorder ([place]), Chronic abdominal pain, Drug-seeking behavior, Lupus ([place]), and Ruptured lumbar disc.   Surgical History:   Past Surgical History:  Procedure Laterality Date   ABDOMINAL HYSTERECTOMY     CHOLECYSTECTOMY     TONSILLECTOMY       Social History:   reports that she has been smoking cigarettes. She has been smoking an average of .5 packs per day. She has never used smokeless tobacco. She reports that she does not drink alcohol and does not use drugs.   Family  History:  Her family history is not on file.   Allergies Allergies  Allergen Reactions   Avocado Itching and Swelling    Throat swelling    Benadryl [Diphenhydramine Hcl] Other (See Comments)    Makes her legs kick    The patient is critically ill with multiple organ systems failure and requires high complexity decision making for assessment and support, frequent evaluation and titration of therapies, application of advanced monitoring technologies and extensive interpretation of multiple databases. Critical Care Time devoted to patient care services described in this note independent of APP/resident time (if applicable)  is 32 minutes.   Sherrilyn Rist MD [facility] Personal pager:  See Amion If unanswered, please page CCM On-call: [phone]

## 2021-05-24 LAB — GLUCOSE, CAPILLARY
Glucose-Capillary: 123 mg/dL — ABNORMAL HIGH (ref 70–99)
Glucose-Capillary: 128 mg/dL — ABNORMAL HIGH (ref 70–99)
Glucose-Capillary: 69 mg/dL — ABNORMAL LOW (ref 70–99)
Glucose-Capillary: 71 mg/dL (ref 70–99)
Glucose-Capillary: 71 mg/dL (ref 70–99)
Glucose-Capillary: 80 mg/dL (ref 70–99)
Glucose-Capillary: 84 mg/dL (ref 70–99)

## 2021-05-24 MED ORDER — GABAPENTIN 300 MG PO CAPS
300.0000 mg | ORAL_CAPSULE | Freq: Three times a day (TID) | ORAL | Status: DC
  Administered 2021-05-24 – 2021-05-25 (×4): 300 mg via ORAL
  Filled 2021-05-24 (×4): qty 1

## 2021-05-24 MED ORDER — LORAZEPAM 2 MG/ML IJ SOLN
1.0000 mg | INTRAMUSCULAR | Status: DC | PRN
  Administered 2021-05-24 (×2): 2 mg via INTRAVENOUS
  Filled 2021-05-24 (×2): qty 1

## 2021-05-24 MED ORDER — FLUOXETINE HCL 10 MG PO CAPS
10.0000 mg | ORAL_CAPSULE | Freq: Every day | ORAL | Status: DC
  Administered 2021-05-24: 11:00:00 10 mg via ORAL
  Filled 2021-05-24 (×3): qty 1

## 2021-05-24 MED ORDER — PANTOPRAZOLE SODIUM 40 MG PO TBEC
40.0000 mg | DELAYED_RELEASE_TABLET | Freq: Every day | ORAL | Status: DC
  Administered 2021-05-24 – 2021-05-25 (×2): 40 mg via ORAL
  Filled 2021-05-24 (×2): qty 1

## 2021-05-24 NOTE — Progress Notes (Signed)
NAME:  Sonya King, MRN:  [id number], DOB:  07-13-1980, LOS: 2 ADMISSION DATE:  05/22/2021, CONSULTATION DATE:  12/16 REFERRING MD: Dr. Regenia Skeeter, CHIEF COMPLAINT:  Found Down   History of Present Illness:  40 y/o F who presented to [facility] ER on 12/16 after being found down in a hotel room by friends unresponsive.   The patient reportedly lives in a hotel.  Her last known normal is unknown.  Reportedly her friends did CPR for an unknown period of time after finding her and EMS was called.  On Fire Dept arrival, she was unresponsive and required BVM assist for ventilation.  She was given 3mg  Narcan with improvement in respiratory status, flailing but no improvement in sensorium.   On arrival to the ER, her GCS was 8.  She was incontinent of bowel/bladder. She moved all four extremities on arrival but not to command.  The patient required intubation for airway protection in the setting of suspected overdose.  ETT adjusted after intubation (pulled back 2cm) . CXR was negative for acute process.  Labs, viral studies, cultures and CT head pending.    PCCM called for ICU admission.   Pertinent  Medical History  Lupus  Anxiety  Bipolar Disorder  Chronic Abdominal Pain Drug Seeking Behavior  Ruptured Lumbar Disc  Significant Hospital Events: Including procedures, antibiotic start and stop dates in addition to other pertinent events   12/16 Admit after being found down in a hotel unresponsive, suspected overdose. Intubated. 12/17 extubated   Interim History / Subjective:  She is awake and interactive  Objective   Blood pressure 96/78, pulse 81, temperature 97.7 F (36.5 C), temperature source Oral, resp. rate (!) 23, height 5\' 4"  (1.626 m), weight 66.9 kg, SpO2 96 %.    FiO2 (%):  [36 %] 36 %   Intake/Output Summary (Last 24 hours) at 05/24/2021 0820 Last data filed at 05/24/2021 0600 Gross per 24 hour  Intake 1497.22 ml  Output 1020 ml  Net 477.22 ml   Filed Weights   05/22/21  1700 05/22/21 2100 05/23/21 0500  Weight: 84.4 kg 66.9 kg 66.9 kg    Examination: General: Critically ill-appearing  HENT: Moist oral mucosa  lungs: Clear breath sounds  cardiovascular: S1S2 RRR, no m/r/g  Abdomen: Bowel sounds appreciated Extremities: warm/dry, multiple bruises on LE's of varying stages, tattoo on left foot Neuro: sedate/post paralytics on vent  Resolved Hospital Problem list     Assessment & Plan:   Drug overdose -U tox positive for amphetamines, benzodiazepines, cocaine  Metabolic encephalopathy -CT negative -Supportive measures  Acute hypoxemic respiratory failure secondary to drug overdose -Off vent -No requiring oxygen supplementation  At risk AKI -BMET within normal limits  History of chronic pain -Home medications on hold -Resume fluoxetine and Neurontin  Prolonged QT -Continue to monitor  History of lupus -Supportive measures  She has remained clinically stable Will transfer out of ICU Transfer to hospitalist service  Best Practice (right click and "Reselect all SmartList Selections" daily)  Diet/type: Regular consistency (see orders) DVT prophylaxis: LMWH GI prophylaxis: PPI Lines: N/A Foley:  N/A Code Status:  full code Last date of multidisciplinary goals of care discussion: pending   Labs   CBC: Recent Labs  Lab 05/22/21 1714 05/22/21 1808 05/23/21 0252  WBC 9.4  --  7.3  NEUTROABS 7.0  --   --   HGB 13.0 12.9 12.0  HCT 39.1 38.0 36.9  MCV 93.5  --  94.1  PLT 217  --  056    Basic Metabolic Panel: Recent Labs  Lab 05/22/21 1714 05/22/21 1808 05/23/21 0252  NA 134* 138 138  K 2.9* 3.0* 3.9  CL 105 101 107  CO2 22  --  23  GLUCOSE 118* 117* 80  BUN 17 19 10   CREATININE 0.63 0.50 0.47  CALCIUM 8.2*  --  8.1*  MG  --   --  1.9  PHOS  --   --  3.3   GFR: Estimated Creatinine Clearance: 88.8 mL/min (by C-G formula based on SCr of 0.47 mg/dL). Recent Labs  Lab 05/22/21 1714 05/22/21 2101 05/22/21 2237  05/23/21 0252  WBC 9.4  --   --  7.3  LATICACIDVEN  --  0.9 0.5  --     Liver Function Tests: Recent Labs  Lab 05/22/21 1714  AST 21  ALT 8  ALKPHOS 56  BILITOT 0.9  PROT 6.7  ALBUMIN 3.8   No results for input(s): LIPASE, AMYLASE in the last 168 hours. No results for input(s): AMMONIA in the last 168 hours.  ABG    Component Value Date/Time   PHART 7.335 (L) 05/22/2021 1955   PCO2ART 44.0 05/22/2021 1955   PO2ART 151 (H) 05/22/2021 1955   HCO3 22.8 05/22/2021 1955   TCO2 27 05/22/2021 1808   ACIDBASEDEF 2.5 (H) 05/22/2021 1955   O2SAT 99.2 05/22/2021 1955     Coagulation Profile: No results for input(s): INR, PROTIME in the last 168 hours.  Cardiac Enzymes: No results for input(s): CKTOTAL, CKMB, CKMBINDEX, TROPONINI in the last 168 hours.  HbA1C: No results found for: HGBA1C  CBG: Recent Labs  Lab 05/23/21 2045 05/24/21 0012 05/24/21 0037 05/24/21 0357 05/24/21 0749  GLUCAP 63* 69* 80 71 71    Review of Systems:   Unable to complete as patient is altered on mechanical ventilation. No family at bedside.   Past Medical History:  She,  has a past medical history of Anxiety, Bipolar 1 disorder ([place]), Chronic abdominal pain, Drug-seeking behavior, Lupus ([place]), and Ruptured lumbar disc.   Surgical History:   Past Surgical History:  Procedure Laterality Date   ABDOMINAL HYSTERECTOMY     CHOLECYSTECTOMY     TONSILLECTOMY       Social History:   reports that she has been smoking cigarettes. She has been smoking an average of .5 packs per day. She has never used smokeless tobacco. She reports that she does not drink alcohol and does not use drugs.   Family History:  Her family history is not on file.   Allergies Allergies  Allergen Reactions   Avocado Itching and Swelling    Throat swelling    Benadryl [Diphenhydramine Hcl] Other (See Comments)    Makes her legs kick    Sherrilyn Rist, MD [place] [facility] Pager: See Shea Evans

## 2021-05-24 NOTE — Progress Notes (Signed)
Nurse called elink Sonya King for clarification on ativan order. Nurse noted that the order is written as scheduled ativan every 4 hours. No new orders at this time. Will continue to monitor.

## 2021-05-25 DIAGNOSIS — T50904D Poisoning by unspecified drugs, medicaments and biological substances, undetermined, subsequent encounter: Secondary | ICD-10-CM

## 2021-05-25 LAB — GLUCOSE, CAPILLARY
Glucose-Capillary: 111 mg/dL — ABNORMAL HIGH (ref 70–99)
Glucose-Capillary: 145 mg/dL — ABNORMAL HIGH (ref 70–99)
Glucose-Capillary: 166 mg/dL — ABNORMAL HIGH (ref 70–99)

## 2021-05-25 NOTE — TOC Transition Note (Signed)
Transition of Care [facility]) - CM/SW Discharge Note   Patient Details  Name: Sonya King MRN: [id number] Date of Birth: January 27, 1981  [facility] CM/SW Contact:  Lennart Pall, LCSW Phone Number: 05/25/2021, 10:27 AM   Clinical Narrative:     Pt medically cleared for dc today.  Provided handouts on local SA resources as well as requested bus passes.  Pt denies any further needs.  Final next level of care: Homeless Shelter Barriers to Discharge: Barriers Resolved   Patient Goals and CMS Choice        Discharge Placement                       Discharge Plan and Services                DME Arranged: N/A DME Agency: NA                  Social Determinants of Health (SDOH) Interventions     Readmission Risk Interventions No flowsheet data found.

## 2021-05-25 NOTE — Plan of Care (Signed)

## 2021-05-25 NOTE — Discharge Summary (Signed)
Physician Discharge Summary  Patient ID: Sonya King MRN: [id number] DOB/AGE: 02-21-1981 40 y.o.  Admit date: 05/22/2021 Discharge date: 05/25/2021    Discharge Diagnoses:  Polysubstance Overdose  Acute Metabolic / Toxic Encephalopathy  Acute Hypoxemic Respiratory Failure  Chronic Pain  Prolonged QT Hx Lupus                                                               DISCHARGE SUMMARY  40 y/o F who presented to [facility] ER on 12/16 after being found down in a hotel room by her friends unresponsive.    The patient lives in a hotel.  On presentation her last known well was not clearly defined.  Her friends tried CPR for an unknown period of time and called EMS.  She was unresponsive on Fire arrival and required BVM assist for ventilation.  She had some response to narcan with improvement in respiratory status but no change in sensorium. In the ER, she had a GCS of 8.  She was incontinent of bowel and bladder.  The patient was intubated for airway protection in the setting of suspected drug overdose.  CXR was negative for acute process.  CT of the head was negative. UDS was positive for amphetamines, benzodiazepines and cocaine.  Salicylate and ETOH negative. COVID and influenza testing was negative. HIV negative. She was admitted to the ICU for supportive care.  The patient was supported on mechanical ventilation overnight.  She met extubation criteria on 12/17 and was successfully liberated from the ventilator. Labs returned to normal limits.  The patient tolerated a regular diet and was ambulatory without assistance. She was medically cleared for discharge on 12/19 with plans as below.         DISCHARGE PLAN BY DIAGNOSIS     Polysubstance Overdose  Acute Metabolic / Toxic Encephalopathy  Acute Hypoxemic Respiratory Failure  Chronic Pain  Prolonged QT Hx Lupus                             Discharge Plan: -patient to return to [facility] for  Methadone management  -no new prescriptions given at discharge  -resume home medications  -patient counseled on seriousness of admission / near arrest situation. Encouraged her to seek NA / services for addiction  -encouraged patient to find a primary care provider for general health needs              KEY EVENTS 12/16 Admit with polysubstance overdose  12/17 Extubated  12/19 Discharge  SIGNIFICANT DIAGNOSTIC STUDIES 12/16 CT Head >> negative for acute process   MICRO DATA  12/16 COVID, Influenza >> negative   Discharge Exam: General: disheveled adult female sitting up in bed in NAD, was in restroom upon entry to room.  Ambulates without difficulty  Neuro:AAOx4, speech clear, MAE/non-focal  CV: s1s2 RRR, no m/r/g PULM: non-labored at rest, clear bilaterally  Extremities: warm/dry, multiple bruises on LE's, tattoo on left foot   Vitals:   05/25/21 0020 05/25/21 0421 05/25/21 0500 05/25/21 1012  BP: 93/64 96/65  91/63  Pulse: 92 71  95  Resp: _0 Temp: 97.9 F (36.6 C) 97.8 F (36.6 C)  97.6 F (36.4 C)  TempSrc:  Oral    SpO2: 96% 97%  99%  Weight:   68.2 kg   Height:         Discharge Labs  BMET Recent Labs  Lab 05/22/21 1714 05/22/21 1808 05/23/21 0252  NA 134* 138 138  K 2.9* 3.0* 3.9  CL 105 101 107  CO2 22  --  23  GLUCOSE 118* 117* 80  BUN _0 CREATININE 0.63 0.50 0.47  CALCIUM 8.2*  --  8.1*  MG  --   --  1.9  PHOS  --   --  3.3    CBC Recent Labs  Lab 05/22/21 1714 05/22/21 1808 05/23/21 0252  HGB 13.0 12.9 12.0  HCT 39.1 38.0 36.9  WBC 9.4  --  7.3  PLT 217  --  210     Discharge Instructions     Call MD for:  difficulty breathing, headache or visual disturbances   Complete by: As directed    Call MD for:  extreme fatigue   Complete by: As directed    Call MD for:  hives   Complete by: As directed    Call MD for:  persistant dizziness or light-headedness   Complete by: As directed    Call MD for:  persistant  nausea and vomiting   Complete by: As directed    Call MD for:  temperature >100.4   Complete by: As directed    Diet general   Complete by: As directed    Discharge instructions   Complete by: As directed    1.  [facility].  They are open today until 2pm 2. Take medications as prescribed 3. Avoid use of non-prescribed substances 4. Report to the ER if new or worsening symptoms   Increase activity slowly   Complete by: As directed          Follow-up Information     [facility] Follow up.   Why: Open until 2pm per website. Will need to follow up there for methadone dosing. Contact information: [place], [place], [place] [address]  [phone]                 Allergies as of 05/25/2021       Reactions   Avocado Itching, Swelling   Throat swelling   Benadryl [diphenhydramine Hcl] Other (See Comments)   Makes her legs kick        Medication List     STOP taking these medications    chlorhexidine 0.12 % solution Commonly known as: Peridex   cyclobenzaprine 10 MG tablet Commonly known as: FLEXERIL   fluticasone 50 MCG/ACT nasal spray Commonly known as: FLONASE   hydrOXYzine 25 MG tablet Commonly known as: ATARAX   lidocaine 2 % solution Commonly known as: XYLOCAINE   ondansetron 4 MG disintegrating tablet Commonly known as: Zofran ODT   ondansetron 4 MG tablet Commonly known as: ZOFRAN   pantoprazole 40 MG tablet Commonly known as: PROTONIX       TAKE these medications    clonazePAM 2 MG tablet Commonly known as: KLONOPIN Take 2 mg by mouth 4 (four) times daily as needed for anxiety.   FLUoxetine 10 MG capsule Commonly known as: PROZAC Take 10 mg by mouth daily.   gabapentin 300 MG capsule Commonly known as: NEURONTIN Take 300 mg by mouth 3 (three) times daily.   methadone 10 MG/ML solution Commonly known as: DOLOPHINE  Take 67 mg by mouth daily. Last dose pick up  was 05-22-21 for 2 days.   zolpidem 10 MG tablet Commonly known as: AMBIEN Take 10 mg by mouth at bedtime.          Disposition: Home.  Lives in a hotel.  Bus ticket requested for patient. Clothing discussed with Chaplain.    Discharged Condition: Sonya King has met maximum benefit of inpatient care and is medically stable and cleared for discharge.  Patient is pending follow up as above.      Time spent on disposition:  36 Minutes.     Signed: Noe Gens, MSN, APRN, NP-C, AGACNP-BC  Pulmonary & Critical Care 05/25/2021, 10:13 AM   Please see [email or web address] for pager details.

## 2021-06-20 ENCOUNTER — Emergency Department (HOSPITAL_COMMUNITY): Payer: Medicaid Other

## 2021-06-20 ENCOUNTER — Inpatient Hospital Stay (HOSPITAL_COMMUNITY): Payer: Medicaid Other

## 2021-06-20 ENCOUNTER — Inpatient Hospital Stay (HOSPITAL_COMMUNITY)
Admission: EM | Admit: 2021-06-20 | Discharge: 2021-07-08 | DRG: 917 | Disposition: E | Payer: Medicaid Other | Attending: Pulmonary Disease | Admitting: Pulmonary Disease

## 2021-06-20 DIAGNOSIS — J9600 Acute respiratory failure, unspecified whether with hypoxia or hypercapnia: Secondary | ICD-10-CM

## 2021-06-20 DIAGNOSIS — Z01818 Encounter for other preprocedural examination: Secondary | ICD-10-CM

## 2021-06-20 DIAGNOSIS — A419 Sepsis, unspecified organism: Secondary | ICD-10-CM | POA: Diagnosis present

## 2021-06-20 DIAGNOSIS — G931 Anoxic brain damage, not elsewhere classified: Secondary | ICD-10-CM

## 2021-06-20 DIAGNOSIS — F1120 Opioid dependence, uncomplicated: Secondary | ICD-10-CM | POA: Diagnosis present

## 2021-06-20 DIAGNOSIS — R6521 Severe sepsis with septic shock: Secondary | ICD-10-CM | POA: Diagnosis present

## 2021-06-20 DIAGNOSIS — R34 Anuria and oliguria: Secondary | ICD-10-CM | POA: Diagnosis not present

## 2021-06-20 DIAGNOSIS — I468 Cardiac arrest due to other underlying condition: Secondary | ICD-10-CM | POA: Diagnosis present

## 2021-06-20 DIAGNOSIS — F1910 Other psychoactive substance abuse, uncomplicated: Secondary | ICD-10-CM | POA: Diagnosis present

## 2021-06-20 DIAGNOSIS — I959 Hypotension, unspecified: Secondary | ICD-10-CM | POA: Diagnosis present

## 2021-06-20 DIAGNOSIS — Z66 Do not resuscitate: Secondary | ICD-10-CM | POA: Diagnosis present

## 2021-06-20 DIAGNOSIS — G8929 Other chronic pain: Secondary | ICD-10-CM | POA: Diagnosis present

## 2021-06-20 DIAGNOSIS — I428 Other cardiomyopathies: Secondary | ICD-10-CM | POA: Diagnosis not present

## 2021-06-20 DIAGNOSIS — Z20822 Contact with and (suspected) exposure to covid-19: Secondary | ICD-10-CM | POA: Diagnosis present

## 2021-06-20 DIAGNOSIS — G9382 Brain death: Secondary | ICD-10-CM | POA: Diagnosis not present

## 2021-06-20 DIAGNOSIS — E876 Hypokalemia: Secondary | ICD-10-CM | POA: Diagnosis present

## 2021-06-20 DIAGNOSIS — R4020 Unspecified coma: Secondary | ICD-10-CM | POA: Diagnosis present

## 2021-06-20 DIAGNOSIS — T50901A Poisoning by unspecified drugs, medicaments and biological substances, accidental (unintentional), initial encounter: Secondary | ICD-10-CM | POA: Diagnosis present

## 2021-06-20 DIAGNOSIS — Z95828 Presence of other vascular implants and grafts: Secondary | ICD-10-CM

## 2021-06-20 DIAGNOSIS — E8729 Other acidosis: Secondary | ICD-10-CM | POA: Diagnosis present

## 2021-06-20 DIAGNOSIS — F319 Bipolar disorder, unspecified: Secondary | ICD-10-CM | POA: Diagnosis present

## 2021-06-20 DIAGNOSIS — E873 Alkalosis: Secondary | ICD-10-CM | POA: Diagnosis present

## 2021-06-20 DIAGNOSIS — J690 Pneumonitis due to inhalation of food and vomit: Secondary | ICD-10-CM | POA: Diagnosis present

## 2021-06-20 DIAGNOSIS — I10 Essential (primary) hypertension: Secondary | ICD-10-CM | POA: Diagnosis present

## 2021-06-20 DIAGNOSIS — J80 Acute respiratory distress syndrome: Secondary | ICD-10-CM | POA: Diagnosis present

## 2021-06-20 DIAGNOSIS — M329 Systemic lupus erythematosus, unspecified: Secondary | ICD-10-CM | POA: Diagnosis present

## 2021-06-20 DIAGNOSIS — I469 Cardiac arrest, cause unspecified: Secondary | ICD-10-CM | POA: Diagnosis present

## 2021-06-20 DIAGNOSIS — E870 Hyperosmolality and hypernatremia: Secondary | ICD-10-CM | POA: Diagnosis present

## 2021-06-20 DIAGNOSIS — G936 Cerebral edema: Secondary | ICD-10-CM | POA: Diagnosis not present

## 2021-06-20 DIAGNOSIS — Z5289 Donor of other specified organs or tissues: Secondary | ICD-10-CM

## 2021-06-20 DIAGNOSIS — R29818 Other symptoms and signs involving the nervous system: Secondary | ICD-10-CM

## 2021-06-20 LAB — POCT I-STAT 7, (LYTES, BLD GAS, ICA,H+H)
Acid-Base Excess: 3 mmol/L — ABNORMAL HIGH (ref 0.0–2.0)
Acid-Base Excess: 0 mmol/L (ref 0.0–2.0)
Acid-base deficit: 1 mmol/L (ref 0.0–2.0)
Acid-base deficit: 1 mmol/L (ref 0.0–2.0)
Acid-base deficit: 1 mmol/L (ref 0.0–2.0)
Acid-base deficit: 3 mmol/L — ABNORMAL HIGH (ref 0.0–2.0)
Bicarbonate: 23.3 mmol/L (ref 20.0–28.0)
Bicarbonate: 24.1 mmol/L (ref 20.0–28.0)
Bicarbonate: 24.4 mmol/L (ref 20.0–28.0)
Bicarbonate: 25.3 mmol/L (ref 20.0–28.0)
Bicarbonate: 25.5 mmol/L (ref 20.0–28.0)
Bicarbonate: 26.1 mmol/L (ref 20.0–28.0)
Calcium, Ion: 1.15 mmol/L (ref 1.15–1.40)
Calcium, Ion: 1.21 mmol/L (ref 1.15–1.40)
Calcium, Ion: 1.26 mmol/L (ref 1.15–1.40)
Calcium, Ion: 1.28 mmol/L (ref 1.15–1.40)
Calcium, Ion: 1.31 mmol/L (ref 1.15–1.40)
Calcium, Ion: 1.33 mmol/L (ref 1.15–1.40)
HCT: 30 % — ABNORMAL LOW (ref 36.0–46.0)
HCT: 41 % (ref 36.0–46.0)
HCT: 43 % (ref 36.0–46.0)
HCT: 43 % (ref 36.0–46.0)
HCT: 43 % (ref 36.0–46.0)
HCT: 44 % (ref 36.0–46.0)
Hemoglobin: 10.2 g/dL — ABNORMAL LOW (ref 12.0–15.0)
Hemoglobin: 13.9 g/dL (ref 12.0–15.0)
Hemoglobin: 14.6 g/dL (ref 12.0–15.0)
Hemoglobin: 14.6 g/dL (ref 12.0–15.0)
Hemoglobin: 14.6 g/dL (ref 12.0–15.0)
Hemoglobin: 15 g/dL (ref 12.0–15.0)
O2 Saturation: 100 %
O2 Saturation: 78 %
O2 Saturation: 87 %
O2 Saturation: 89 %
O2 Saturation: 90 %
O2 Saturation: 96 %
Patient temperature: 34.9
Patient temperature: 35.4
Patient temperature: 36.4
Patient temperature: 36.8
Patient temperature: 36.9
Patient temperature: 37.4
Potassium: 3 mmol/L — ABNORMAL LOW (ref 3.5–5.1)
Potassium: 3.3 mmol/L — ABNORMAL LOW (ref 3.5–5.1)
Potassium: 3.3 mmol/L — ABNORMAL LOW (ref 3.5–5.1)
Potassium: 3.8 mmol/L (ref 3.5–5.1)
Potassium: 3.8 mmol/L (ref 3.5–5.1)
Potassium: 4.4 mmol/L (ref 3.5–5.1)
Sodium: 141 mmol/L (ref 135–145)
Sodium: 146 mmol/L — ABNORMAL HIGH (ref 135–145)
Sodium: 149 mmol/L — ABNORMAL HIGH (ref 135–145)
Sodium: 150 mmol/L — ABNORMAL HIGH (ref 135–145)
Sodium: 151 mmol/L — ABNORMAL HIGH (ref 135–145)
Sodium: 151 mmol/L — ABNORMAL HIGH (ref 135–145)
TCO2: 24 mmol/L (ref 22–32)
TCO2: 25 mmol/L (ref 22–32)
TCO2: 26 mmol/L (ref 22–32)
TCO2: 27 mmol/L (ref 22–32)
TCO2: 27 mmol/L (ref 22–32)
TCO2: 27 mmol/L (ref 22–32)
pCO2 arterial: 29.6 mmHg — ABNORMAL LOW (ref 32.0–48.0)
pCO2 arterial: 34.1 mmHg (ref 32.0–48.0)
pCO2 arterial: 39.2 mmHg (ref 32.0–48.0)
pCO2 arterial: 41.5 mmHg (ref 32.0–48.0)
pCO2 arterial: 47.1 mmHg (ref 32.0–48.0)
pCO2 arterial: 57.8 mmHg — ABNORMAL HIGH (ref 32.0–48.0)
pH, Arterial: 7.254 — ABNORMAL LOW (ref 7.350–7.450)
pH, Arterial: 7.337 — ABNORMAL LOW (ref 7.350–7.450)
pH, Arterial: 7.372 (ref 7.350–7.450)
pH, Arterial: 7.401 (ref 7.350–7.450)
pH, Arterial: 7.433 (ref 7.350–7.450)
pH, Arterial: 7.547 — ABNORMAL HIGH (ref 7.350–7.450)
pO2, Arterial: 234 mmHg — ABNORMAL HIGH (ref 83.0–108.0)
pO2, Arterial: 46 mmHg — ABNORMAL LOW (ref 83.0–108.0)
pO2, Arterial: 54 mmHg — ABNORMAL LOW (ref 83.0–108.0)
pO2, Arterial: 55 mmHg — ABNORMAL LOW (ref 83.0–108.0)
pO2, Arterial: 69 mmHg — ABNORMAL LOW (ref 83.0–108.0)
pO2, Arterial: 71 mmHg — ABNORMAL LOW (ref 83.0–108.0)

## 2021-06-20 LAB — URINALYSIS, MICROSCOPIC (REFLEX)

## 2021-06-20 LAB — I-STAT CHEM 8, ED
BUN: 12 mg/dL (ref 6–20)
Calcium, Ion: 1.07 mmol/L — ABNORMAL LOW (ref 1.15–1.40)
Chloride: 106 mmol/L (ref 98–111)
Creatinine, Ser: 0.8 mg/dL (ref 0.44–1.00)
Glucose, Bld: 274 mg/dL — ABNORMAL HIGH (ref 70–99)
HCT: 43 % (ref 36.0–46.0)
Hemoglobin: 14.6 g/dL (ref 12.0–15.0)
Potassium: 4.7 mmol/L (ref 3.5–5.1)
Sodium: 141 mmol/L (ref 135–145)
TCO2: 23 mmol/L (ref 22–32)

## 2021-06-20 LAB — I-STAT ARTERIAL BLOOD GAS, ED
Acid-base deficit: 9 mmol/L — ABNORMAL HIGH (ref 0.0–2.0)
Bicarbonate: 20.9 mmol/L (ref 20.0–28.0)
Calcium, Ion: 1.18 mmol/L (ref 1.15–1.40)
HCT: 48 % — ABNORMAL HIGH (ref 36.0–46.0)
Hemoglobin: 16.3 g/dL — ABNORMAL HIGH (ref 12.0–15.0)
O2 Saturation: 81 %
Patient temperature: 98.9
Potassium: 4.4 mmol/L (ref 3.5–5.1)
Sodium: 142 mmol/L (ref 135–145)
TCO2: 23 mmol/L (ref 22–32)
pCO2 arterial: 58 mmHg — ABNORMAL HIGH (ref 32.0–48.0)
pH, Arterial: 7.165 — CL (ref 7.350–7.450)
pO2, Arterial: 58 mmHg — ABNORMAL LOW (ref 83.0–108.0)

## 2021-06-20 LAB — URINALYSIS, ROUTINE W REFLEX MICROSCOPIC
Bilirubin Urine: NEGATIVE
Glucose, UA: 250 mg/dL — AB
Ketones, ur: NEGATIVE mg/dL
Leukocytes,Ua: NEGATIVE
Nitrite: NEGATIVE
Protein, ur: 100 mg/dL — AB
Specific Gravity, Urine: 1.02 (ref 1.005–1.030)
pH: 6.5 (ref 5.0–8.0)

## 2021-06-20 LAB — RESP PANEL BY RT-PCR (FLU A&B, COVID) ARPGX2
Influenza A by PCR: NEGATIVE
Influenza B by PCR: NEGATIVE
SARS Coronavirus 2 by RT PCR: NEGATIVE

## 2021-06-20 LAB — LACTIC ACID, PLASMA
Lactic Acid, Venous: >9 mmol/L (ref 0.5–1.9)
Lactic Acid, Venous: 2.8 mmol/L (ref 0.5–1.9)
Lactic Acid, Venous: 1.6 mmol/L (ref 0.5–1.9)

## 2021-06-20 LAB — TROPONIN I (HIGH SENSITIVITY)
Troponin I (High Sensitivity): 50 ng/L — ABNORMAL HIGH (ref <18)
Troponin I (High Sensitivity): 174 ng/L (ref <18)

## 2021-06-20 LAB — COMPREHENSIVE METABOLIC PANEL
ALT: 48 U/L — ABNORMAL HIGH (ref 0–44)
AST: 194 U/L — ABNORMAL HIGH (ref 15–41)
Albumin: 3.3 g/dL — ABNORMAL LOW (ref 3.5–5.0)
Alkaline Phosphatase: 121 U/L (ref 38–126)
Anion gap: 20 — ABNORMAL HIGH (ref 5–15)
BUN: 10 mg/dL (ref 6–20)
CO2: 17 mmol/L — ABNORMAL LOW (ref 22–32)
Calcium: 8.2 mg/dL — ABNORMAL LOW (ref 8.9–10.3)
Chloride: 105 mmol/L (ref 98–111)
Creatinine, Ser: 1.15 mg/dL — ABNORMAL HIGH (ref 0.44–1.00)
GFR, Estimated: >60 mL/min (ref >60)
Glucose, Bld: 277 mg/dL — ABNORMAL HIGH (ref 70–99)
Potassium: 4.7 mmol/L (ref 3.5–5.1)
Sodium: 142 mmol/L (ref 135–145)
Total Bilirubin: 0.5 mg/dL (ref 0.3–1.2)
Total Protein: 6.4 g/dL — ABNORMAL LOW (ref 6.5–8.1)

## 2021-06-20 LAB — PROCALCITONIN: Procalcitonin: <0.1 ng/mL

## 2021-06-20 LAB — BASIC METABOLIC PANEL
Anion gap: 11 (ref 5–15)
BUN: 14 mg/dL (ref 6–20)
CO2: 23 mmol/L (ref 22–32)
Calcium: 9.5 mg/dL (ref 8.9–10.3)
Chloride: 113 mmol/L — ABNORMAL HIGH (ref 98–111)
Creatinine, Ser: 0.71 mg/dL (ref 0.44–1.00)
GFR, Estimated: >60 mL/min (ref >60)
Glucose, Bld: 178 mg/dL — ABNORMAL HIGH (ref 70–99)
Potassium: 3.2 mmol/L — ABNORMAL LOW (ref 3.5–5.1)
Sodium: 147 mmol/L — ABNORMAL HIGH (ref 135–145)

## 2021-06-20 LAB — TYPE AND SCREEN
ABO/RH(D): O POS
Antibody Screen: NEGATIVE

## 2021-06-20 LAB — RAPID URINE DRUG SCREEN, HOSP PERFORMED
Amphetamines: NOT DETECTED
Barbiturates: NOT DETECTED
Benzodiazepines: POSITIVE — AB
Cocaine: NOT DETECTED
Opiates: NOT DETECTED
Tetrahydrocannabinol: NOT DETECTED

## 2021-06-20 LAB — CBC
HCT: 43.9 % (ref 36.0–46.0)
Hemoglobin: 13.3 g/dL (ref 12.0–15.0)
MCH: 31.2 pg (ref 26.0–34.0)
MCHC: 30.3 g/dL (ref 30.0–36.0)
MCV: 103.1 fL — ABNORMAL HIGH (ref 80.0–100.0)
Platelets: 364 10*3/uL (ref 150–400)
RBC: 4.26 MIL/uL (ref 3.87–5.11)
RDW: 13.7 % (ref 11.5–15.5)
WBC: 22.5 10*3/uL — ABNORMAL HIGH (ref 4.0–10.5)
nRBC: 0 % (ref 0.0–0.2)

## 2021-06-20 LAB — MAGNESIUM: Magnesium: 2.6 mg/dL — ABNORMAL HIGH (ref 1.7–2.4)

## 2021-06-20 LAB — APTT: aPTT: 35 seconds (ref 24–36)

## 2021-06-20 LAB — ECHOCARDIOGRAM COMPLETE
Height: 63 in
S' Lateral: 2.5 cm
Weight: 2080 oz

## 2021-06-20 LAB — HIV ANTIBODY (ROUTINE TESTING W REFLEX): HIV Screen 4th Generation wRfx: NONREACTIVE

## 2021-06-20 LAB — PROTIME-INR
INR: 1.1 (ref 0.8–1.2)
Prothrombin Time: 13.8 seconds (ref 11.4–15.2)

## 2021-06-20 LAB — ETHANOL: Alcohol, Ethyl (B): <10 mg/dL (ref <10)

## 2021-06-20 LAB — MRSA NEXT GEN BY PCR, NASAL: MRSA by PCR Next Gen: NOT DETECTED

## 2021-06-20 LAB — GLUCOSE, CAPILLARY
Glucose-Capillary: 179 mg/dL — ABNORMAL HIGH (ref 70–99)
Glucose-Capillary: 185 mg/dL — ABNORMAL HIGH (ref 70–99)

## 2021-06-20 LAB — PHOSPHORUS: Phosphorus: 11.3 mg/dL — ABNORMAL HIGH (ref 2.5–4.6)

## 2021-06-20 LAB — I-STAT BETA HCG BLOOD, ED (MC, WL, AP ONLY): I-stat hCG, quantitative: <5 m[IU]/mL (ref <5)

## 2021-06-20 MED ORDER — LACTATED RINGERS IV BOLUS
1000.0000 mL | Freq: Once | INTRAVENOUS | Status: AC
  Administered 2021-06-20: 1000 mL via INTRAVENOUS

## 2021-06-20 MED ORDER — NALOXONE HCL 0.4 MG/ML IJ SOLN
0.4000 mg | INTRAMUSCULAR | Status: DC | PRN
  Administered 2021-06-20: 0.4 mg via INTRAVENOUS
  Filled 2021-06-20: qty 1

## 2021-06-20 MED ORDER — IPRATROPIUM-ALBUTEROL 0.5-2.5 (3) MG/3ML IN SOLN
3.0000 mL | RESPIRATORY_TRACT | Status: DC | PRN
  Administered 2021-06-20: 3 mL via RESPIRATORY_TRACT
  Filled 2021-06-20: qty 3

## 2021-06-20 MED ORDER — PANTOPRAZOLE SODIUM 40 MG IV SOLR
40.0000 mg | Freq: Every day | INTRAVENOUS | Status: DC
  Administered 2021-06-20 – 2021-06-23 (×4): 40 mg via INTRAVENOUS
  Filled 2021-06-20 (×4): qty 40

## 2021-06-20 MED ORDER — SODIUM CHLORIDE 0.9 % IV SOLN
250.0000 mL | INTRAVENOUS | Status: DC
  Administered 2021-06-20 – 2021-06-22 (×2): 250 mL via INTRAVENOUS

## 2021-06-20 MED ORDER — ACETAMINOPHEN 325 MG PO TABS: 650.0000 mg | ORAL_TABLET | ORAL | Status: DC | PRN

## 2021-06-20 MED ORDER — DOCUSATE SODIUM 100 MG PO CAPS: 100.0000 mg | ORAL_CAPSULE | Freq: Two times a day (BID) | ORAL | Status: DC | PRN

## 2021-06-20 MED ORDER — NOREPINEPHRINE 4 MG/250ML-% IV SOLN
0.0000 ug/min | INTRAVENOUS | Status: DC
  Administered 2021-06-20: 22 ug/min via INTRAVENOUS
  Administered 2021-06-20: 12 ug/min via INTRAVENOUS
  Administered 2021-06-21: 6 ug/min via INTRAVENOUS
  Administered 2021-06-21: 14 ug/min via INTRAVENOUS
  Administered 2021-06-22: 2 ug/min via INTRAVENOUS
  Filled 2021-06-20 (×4): qty 250

## 2021-06-20 MED ORDER — DOCUSATE SODIUM 50 MG/5ML PO LIQD: 100.0000 mg | Freq: Two times a day (BID) | ORAL | Status: DC | PRN

## 2021-06-20 MED ORDER — CHLORHEXIDINE GLUCONATE 0.12% ORAL RINSE (MEDLINE KIT)
15.0000 mL | Freq: Two times a day (BID) | OROMUCOSAL | Status: DC
  Administered 2021-06-20 – 2021-06-24 (×8): 15 mL via OROMUCOSAL

## 2021-06-20 MED ORDER — CHLORHEXIDINE GLUCONATE CLOTH 2 % EX PADS
6.0000 | MEDICATED_PAD | Freq: Every day | CUTANEOUS | Status: DC
  Administered 2021-06-21 – 2021-06-23 (×3): 6 via TOPICAL

## 2021-06-20 MED ORDER — VASOPRESSIN 20 UNITS/100 ML INFUSION FOR SHOCK
0.0300 [IU]/min | INTRAVENOUS | Status: DC
  Administered 2021-06-20 – 2021-06-21 (×3): 0.03 [IU]/min via INTRAVENOUS
  Administered 2021-06-22: 0.02 [IU]/min via INTRAVENOUS
  Filled 2021-06-20 (×3): qty 100

## 2021-06-20 MED ORDER — NOREPINEPHRINE 4 MG/250ML-% IV SOLN
INTRAVENOUS | Status: AC
  Filled 2021-06-20: qty 250

## 2021-06-20 MED ORDER — IOHEXOL 350 MG/ML SOLN
100.0000 mL | Freq: Once | INTRAVENOUS | Status: AC | PRN
  Administered 2021-06-20: 100 mL via INTRAVENOUS

## 2021-06-20 MED ORDER — SODIUM CHLORIDE 0.9 % IV SOLN
3.0000 g | Freq: Four times a day (QID) | INTRAVENOUS | Status: DC
  Administered 2021-06-20 – 2021-06-24 (×17): 3 g via INTRAVENOUS
  Filled 2021-06-20 (×20): qty 8

## 2021-06-20 MED ORDER — ONDANSETRON HCL 4 MG/2ML IJ SOLN: 4.0000 mg | Freq: Four times a day (QID) | INTRAMUSCULAR | Status: DC | PRN

## 2021-06-20 MED ORDER — FENTANYL CITRATE PF 50 MCG/ML IJ SOSY
50.0000 ug | PREFILLED_SYRINGE | INTRAMUSCULAR | Status: DC | PRN
  Administered 2021-06-20: 50 ug via INTRAVENOUS
  Filled 2021-06-20: qty 1

## 2021-06-20 MED ORDER — LACTATED RINGERS IV SOLN: INTRAVENOUS | Status: DC

## 2021-06-20 MED ORDER — ORAL CARE MOUTH RINSE
15.0000 mL | OROMUCOSAL | Status: DC
  Administered 2021-06-20 – 2021-06-24 (×40): 15 mL via OROMUCOSAL

## 2021-06-20 MED ORDER — PROPOFOL 1000 MG/100ML IV EMUL: 5.0000 ug/kg/min | INTRAVENOUS | Status: DC

## 2021-06-20 MED ORDER — NOREPINEPHRINE 4 MG/250ML-% IV SOLN
2.0000 ug/min | INTRAVENOUS | Status: DC
  Administered 2021-06-20: 3 ug/min via INTRAVENOUS
  Filled 2021-06-20: qty 250

## 2021-06-20 MED ORDER — POLYETHYLENE GLYCOL 3350 17 G PO PACK: 17.0000 g | PACK | Freq: Every day | ORAL | Status: DC | PRN

## 2021-06-20 MED ORDER — POTASSIUM CHLORIDE 10 MEQ/50ML IV SOLN
10.0000 meq | INTRAVENOUS | Status: AC
  Administered 2021-06-20 – 2021-06-21 (×4): 10 meq via INTRAVENOUS
  Filled 2021-06-20 (×4): qty 50

## 2021-06-20 NOTE — Procedures (Signed)
Arterial Catheter Insertion Procedure Note  Nicollette A. Beery  [id number]  1981/06/03  Date:06/09/2021  Time:8:52 PM    Provider Performing: Mick Sell    Procedure: Insertion of Arterial Line [phone]) with US guidance ([id number])   Indication(s) Blood pressure monitoring and/or need for frequent ABGs  Consent Unable to obtain consent due to emergent nature of procedure.  Anesthesia None   Time Out Verified patient identification, verified procedure, site/side was marked, verified correct patient position, special equipment/implants available, medications/allergies/relevant history reviewed, required imaging and test results available.   Sterile Technique Maximal sterile technique including full sterile barrier drape, hand hygiene, sterile gown, sterile gloves, mask, hair covering, sterile ultrasound probe cover (if used).   Procedure Description Area of catheter insertion was cleaned with chlorhexidine and draped in sterile fashion. With real-time ultrasound guidance an arterial catheter was placed into the left radial artery.  Appropriate arterial tracings confirmed on monitor.     Complications/Tolerance None; patient tolerated the procedure well.   EBL Minimal   Specimen(s) None  JD Rexene Agent [facility] 06/28/2021, 8:52 PM  Please see [email or web address] for pager details.  From 7A-7P if no response, please call [phone]. After hours, please call ELink [phone].

## 2021-06-20 NOTE — ED Notes (Signed)
Sonya King daughter [phone] requesting an update on the patient

## 2021-06-20 NOTE — ED Notes (Signed)
Sonya King sister [phone] requesting an update

## 2021-06-20 NOTE — Significant Event (Addendum)
Sonya King [phone]) is the oldest daughter. She lives in [place] and states that the closest relative here is the patient's nephew, [name] [phone]). Sonya (daughter) states the patient's parents nor siblings are around and that she is best point of contact.   RN called  "[name]" listed as father in the chart with no response.

## 2021-06-20 NOTE — Progress Notes (Addendum)
° °  Called to bedside for hypotension Given fluid bolus and started on peripheral Levophed with improvement of blood pressure On exam -remains comatose, GCS 3, unresponsive to deep pain stimulus, pupils fixed dilated, no corneal reflex, no conjunctival reflex, doll's eye absent, no spontaneous respiration on vent even when respiratory rate turned down to 10, no gag reflex, no cough  Head CT reviewed which shows extensive cerebral edema Exam is consistent with brain death Urine drug screen is positive for benzodiazepines  Grave prognosis was given to daughter and sister who are on their way from [place].  Also spoke to brother in [place] and gave a similar poor prognosis  Plan will be to place arterial line and then proceed with apnea testing Baseline ABG obtained which shows mild respiratory alkalosis, lower respiratory rate to 24  Additional critical care time was 30 minutes independent of procedure  Sonya King V. Elsworth Soho MD

## 2021-06-20 NOTE — Progress Notes (Signed)
RTs attempted to place arterial line however unsuccessful in attempts by two RTs.  MD aware.

## 2021-06-20 NOTE — TOC Initial Note (Signed)
Transition of Care (TOC) - Initial/Assessment Note    Patient Details  Name: Sonya King MRN: [id number] Date of Birth: September 20, 1980  Transition of Care [facility]) CM/SW Contact:    Elliot Gurney [place], [place] Phone Number: 06/19/2021, 4:54 PM  Clinical Narrative:                 Patient has an open Child Protective Services Case. Lestine Mount [phone] cell [phone] presented to bedside. CPS worker updated by attending regarding the status of patient's medical condition. Patient's cousin Gaylan Gerold  [phone] in route to the hospital, traveling from [place]. She has custody of one of patient's children. Patient has 3 other children ages 62, 69 and 45 that are currently residing with family friends and are not aware of current situation. Patient's condition to be discussed once patient's cousin arrives from [place]. Per CPS worker, CFT (child and family team meeting) to take place on Tuesday, 06/23/21 to determine disposition of patient's children.  Transition of Care to continue to follow  [facility], LCSW Transition of Care [phone]         Patient Goals and CMS Choice        Expected Discharge Plan and Services                                                Prior Living Arrangements/Services                       Activities of Daily Living      Permission Sought/Granted                  Emotional Assessment              Admission diagnosis:  Cardiac arrest [facility]) [I46.9] Patient Active Problem List   Diagnosis Date Noted   Cardiac arrest [facility]) 06/19/2021   PCP:  [facility] [place]:   [place], [place] [address] Phone: [phone] Fax: [phone]     Social Determinants of Health (SDOH) Interventions    Readmission Risk Interventions No flowsheet data found.

## 2021-06-20 NOTE — Progress Notes (Signed)
°  Echocardiogram 2D Echocardiogram has been performed.  Sonya King 06/10/2021, 10:19 AM

## 2021-06-20 NOTE — Progress Notes (Signed)
[place] Progress Note Patient Name: Sonya King DOB: 1981/05/27 MRN: [id number]   Date of Service  06/09/2021  HPI/Events of Note  Hypokalemia - K+ = 3.3 and Creatinine = 0.71.  eICU Interventions  Will replace K+.      Intervention Category Major Interventions: Electrolyte abnormality - evaluation and management  Lysle Dingwall 06/19/2021, 9:43 PM

## 2021-06-20 NOTE — ED Notes (Signed)
Patient transported to CT 

## 2021-06-20 NOTE — Procedures (Signed)
Central Venous Catheter Insertion Procedure Note  Sonya King  [id number]  February 18, 1981  Date:06/19/2021  Time:6:00 PM   Provider Performing:Ellayna Hilligoss V. Elsworth Soho   Procedure: Insertion of Non-tunneled Central Venous Catheter(36556) with US guidance (56387)   Indication(s) Medication administration  Consent Unable to obtain consent due to emergent nature of procedure.  Anesthesia Topical only with 1% lidocaine   Timeout Verified patient identification, verified procedure, site/side was marked, verified correct patient position, special equipment/implants available, medications/allergies/relevant history reviewed, required imaging and test results available.  Sterile Technique Maximal sterile technique including full sterile barrier drape, hand hygiene, sterile gown, sterile gloves, mask, hair covering, sterile ultrasound probe cover (if used).  Procedure Description Area of catheter insertion was cleaned with chlorhexidine and draped in sterile fashion.  With real-time ultrasound guidance a central venous catheter was placed into the right femoral vein. Nonpulsatile blood flow and easy flushing noted in all ports.  The catheter was sutured in place and sterile dressing applied.  Complications/Tolerance None; patient tolerated the procedure well. Chest X-ray is ordered to verify placement for internal jugular or subclavian cannulation.   Chest x-ray is not ordered for femoral cannulation.  EBL Minimal  Specimen(s) None   Jericho Alcorn V. Elsworth Soho MD

## 2021-06-20 NOTE — Progress Notes (Addendum)
[place] Progress Note Patient Name: Sonya King DOB: 11-17-1980 MRN: [id number]   Date of Service  07/05/2021  HPI/Events of Note  ABG on 50%/PRVC 24/TV 410/P 5 = 7.401/39.2/55/24.4. CXR earlier today c/w R base atelectasis. Sat now 98% by oximetry.   eICU Interventions  Plan: Increase FiO2 to 60% and PEEP to 8. Follow oxygenation by oximetry.      Intervention Category Major Interventions: Respiratory failure - evaluation and management;Hypoxemia - evaluation and management  Lysle Dingwall 06/28/2021, 8:57 PM

## 2021-06-20 NOTE — ED Triage Notes (Signed)
Dr. Tinnie Gens re-intubated pt. 7.5. 22 at lip. Bilateral breath sounds heard

## 2021-06-20 NOTE — Progress Notes (Addendum)
An USGPIV (ultrasound guided PIV) has been placed for short-term vasopressor infusion. A correctly placed ivWatch must be used when administering Vasopressors. Should this treatment be needed beyond 72 hours, central line access should be obtained.  It will be the responsibility of the bedside nurse to follow best practice to prevent extravasations.  HS Jhoanna Heyde RN 

## 2021-06-20 NOTE — ED Provider Notes (Signed)
[facility] EMERGENCY DEPARTMENT Provider Note  CSN: [id number] Arrival date & time: 06/23/2021 5852  Chief Complaint(s) Post CPR  HPI Sonya King is a 41 y.o. female who presents to the emergency department after suffering a cardiac arrest.  Patient has a history of polysubstance abuse and minimal history available from EMS as the house in which she was found, occupants are unable to give much information about the patient.  Apparently she was supposed to go to "an appointment" this morning but never woke up.  Unknown downtime.  EMS performed 45 minutes of CPR in the field and achieved return of spontaneous circulation.  Patient arrives with 2 ET tubes in place, 1 in the esophagus 1 in the airway.  Additional review of systems unable to be obtained as the patient is unresponsive  HPI  Past Medical History No past medical history on file. There are no problems to display for this patient.  Home Medication(s) Prior to Admission medications   Not on File                                                                                                                                    Past Surgical History  Family History No family history on file.  Social History   Allergies Patient has no allergy information on record.  Review of Systems Review of Systems  Unable to perform ROS: Patient unresponsive   Physical Exam Vital Signs  I have reviewed the triage vital signs There were no vitals taken for this visit.  Physical Exam Vitals and nursing note reviewed.  Constitutional:      General: She is in acute distress.     Appearance: She is well-developed. She is ill-appearing and toxic-appearing.  HENT:     Head: Normocephalic and atraumatic.  Eyes:     Conjunctiva/sclera: Conjunctivae normal.  Cardiovascular:     Rate and Rhythm: Normal rate and regular rhythm.     Heart sounds: No murmur heard. Pulmonary:     Effort: Pulmonary effort is normal.  No respiratory distress.     Breath sounds: Normal breath sounds.  Abdominal:     Palpations: Abdomen is soft.     Tenderness: There is no abdominal tenderness.  Musculoskeletal:        General: No swelling.     Cervical back: Neck supple.  Skin:    General: Skin is warm and dry.     Capillary Refill: Capillary refill takes less than 2 seconds.  Neurological:     Mental Status: She is alert.    ED Results and Treatments Labs (all labs ordered are listed, but only abnormal results are displayed) Labs Reviewed - No data to display  Radiology No results found.  Pertinent labs & imaging results that were available during my care of the patient were reviewed by me and considered in my medical decision making (see MDM for details).  Medications Ordered in ED Medications - No data to display                                                                                                                                   Procedures .Critical Care Performed by: Teressa Lower, MD Authorized by: Teressa Lower, MD   Critical care provider statement:    Critical care time (minutes):  30   Critical care was necessary to treat or prevent imminent or life-threatening deterioration of the following conditions:  Cardiac failure   Critical care was time spent personally by me on the following activities:  Development of treatment plan with patient or surrogate, discussions with consultants, evaluation of patient's response to treatment, examination of patient, ordering and review of laboratory studies, ordering and review of radiographic studies, ordering and performing treatments and interventions, pulse oximetry, re-evaluation of patient's condition and review of old charts Procedure Name: Intubation Date/Time: 07/04/2021 3:58 PM Performed by: Teressa Lower,  MD Pre-anesthesia Checklist: Patient identified, Patient being monitored, Emergency Drugs available, Timeout performed and Suction available Oxygen Delivery Method: Non-rebreather mask Preoxygenation: Pre-oxygenation with 100% oxygen Induction Type: Rapid sequence Ventilation: Mask ventilation without difficulty Laryngoscope Size: Mac, Glidescope and 3 Grade View: Grade I Tube size: 7.5 mm Placement Confirmation: ETT inserted through vocal cords under direct vision, CO2 detector and Breath sounds checked- equal and bilateral Secured at: 25 cm     (including critical care time)  Medical Decision Making / ED Course   This patient presents to the ED for concern of cardiac arrest, this involves an extensive number of treatment options, and is a complaint that carries with it a high risk of complications and morbidity.  The differential diagnosis includes drug overdose with secondary hypoxia and cardiac arrest, ACS, PE, aortic dissection, electrolyte abnormality  MDM: Patient seen the emergency department for evaluation of cardiac arrest.  Physical exam reveals a toxic appearing patient with 2 ET tubes in place with no purposeful movement.  In an attempt to remove the esophageal tube, we were unable to determine which of the tubes needed to be deflated despite using video laryngoscopy and thus we decided to remove both intubation tubes and reintubate the patient here in the emergency department with a larger size tube.  Intubation performed without sedation or paralytics and the patient had no purposeful movement during this procedure.  7.5 tube placed.  Patient otherwise maintaining stable vital signs with minimal support.  Laboratory evaluation with an AST of 194, ALT 48, initial CO2 17, significant leukocytosis to 22.5 likely elevated in setting of recent cardiac arrest.  Initial blood gas with a pH of 7.16, PCO2 58.0.  Initial UDS positive for benzodiazepines but negative for other drugs.  CT  head with concern for anoxic brain injury.  CT angio abdomen pelvis concerning for massive aspiration.  ICU team assisting with antibiotics patient was admitted to the ICU.   Additional history obtained: -Additional history obtained from EMS -External records from outside source obtained and reviewed including: Chart review including previous notes, labs, imaging, consultation notes   Lab Tests: -I ordered, reviewed, and interpreted labs.   The pertinent results include:   Labs Reviewed - No data to display    EKG  EKG Interpretation  Date/Time:    Ventricular Rate:    PR Interval:    QRS Duration:   QT Interval:    QTC Calculation:   R Axis:     Text Interpretation:           Imaging Studies ordered: I ordered imaging studies including CT head, CT angio abdomen pelvis, chest x-ray I independently visualized and interpreted imaging. I agree with the radiologist interpretation   Medicines ordered and prescription drug management: No orders of the defined types were placed in this encounter.   -I have reviewed the patients home medicines and have made adjustments as needed  Critical interventions Intubation, postarrest support  Consultations Obtained: I requested consultation with the intensivist,  and discussed lab and imaging findings as well as pertinent plan - they recommend: Admission to the ICU   Cardiac Monitoring: The patient was maintained on a cardiac monitor.  I personally viewed and interpreted the cardiac monitored which showed an underlying rhythm of: Normal sinus rhythm    Reevaluation: After the interventions noted above, I reevaluated the patient and found that they have :stayed the same  Co morbidities that complicate the patient evaluation No past medical history on file.    Dispostion: ICU     Final Clinical Impression(s) / ED Diagnoses Final diagnoses:  None     _0 @    Teressa Lower, MD 07/07/2021 1559

## 2021-06-20 NOTE — H&P (Signed)
NAME:  Sonya King, MRN:  [id number], DOB:  10-11-1980, LOS: 0 ADMISSION DATE:  06/18/2021, CONSULTATION DATE:  06/17/2021 REFERRING MD:  Tinnie Gens, CHIEF COMPLAINT:  Cardiac arrest    History of Present Illness:  41 yo F PMH  Lupus, Bipolar, Polysubstance abuse, opioid dependence tx with methadone, unintentional overdose, recently admitted 05/22/21- 05/25/21 for overdose with OOH arrest vs OOH near arrest, who presented to [facility]/27/2021 post arrest. Total downtime unknown--received at least 15 min CPR with EMS receiving 3x epi. Given 2mg  narcan by EMS without response. Intubated in field (arrived to ED with 2 ETTs, one likely in esophagus), both removed and exchanged for 7.5 in ED, noted to have aspirate matter in initial ETTs.  CXR- r basilar opacity. ETT in appropriate position      Labs at admit CMP: -Co2 17, Cr 1.15 AG 20 Phos 11 Mag 2.6 iCal 1.07Albumin 3.3 AST 194 ALT ABG 7.1 7/58/58 on 50%/PEEP of 5 CBC: -WBC 22 Trop 50   Pertinent  Medical History    Significant Hospital Events: Including procedures, antibiotic start and stop dates in addition to other pertinent events   Head CT CT angio chest/abdomen/pelvis  Interim History / Subjective:  No sedation in field or for tube exchange   57mcg fent later given in Ed for HTN + tachycardia   Objective   Blood pressure (!) 144/90, pulse (!) 119, temperature 98.9 F (37.2 C), temperature source Rectal, resp. rate 15, height 5\' 3"  (1.6 m), weight 59 kg, SpO2 100 %.       No intake or output data in the 24 hours ending 06/30/2021 0921 Filed Weights   07/03/2021 0910  Weight: 59 kg    Examination: General: Young woman, orally intubated, comatose, no distress HENT: Pupils fixed and dilated, no doll's eye, mild pallor, no icterus, no JVD Lungs: Coarse breath sounds bilateral, no rhonchi, no accessory muscle use, has spontaneous breathing when RR turned down to 10 Cardiovascular: S1-S2 tacky, no murmur Abdomen: Soft,  nontender Extremities: No deformity, no track marks on skin, multiple tattoos Neuro: Comatose, GCS 3, no posturing to deep pain stimulus GU: Clear urine  Chest x-ray dependently reviewed, ET tube in position, no infiltrates  Resolved Hospital Problem list     Assessment & Plan:   OOH cardiac arrest with prolonged downtime -CPR x 15 min, unknown total downtime -recent admit for OOH arrest vs near arrest in setting of OD requiring narcan P -CT H, CTA -ECHO -UDS, etoh level -trial of narcan  -avoid fever , normothermia protocol  Acute encephalopathy -metabolic vs anoxic P -hold sedation for neuro exam -narcan as above -CT H pending   - correct metabolic abnormalities as able  -If seizure activity we will consult neuro and place LTM EEG  Acute respiratory failure with hypoxia Suspected aspiration event -RLL opacity on CXR -- likely some atelectasis  P -Increase RR based on ABG -Unasyn empirically for aspiration PNA   Polysubstance abuse Chronic pain Opioid dependence P -if medically improves, consider psych consult for addiction med    Unfortunately prognosis appears grave based on initial exam but too early postarrest to prognosticate, we will see how neuro exam evolves and what head CT shows  Best Practice (right click and "Reselect all SmartList Selections" daily)   Diet/type: NPO DVT prophylaxis: LMWH GI prophylaxis: PPI Lines: N/A Foley:  Yes, and it is still needed Code Status:  full code Last date of multidisciplinary goals of care discussion [NA , no family available]  Labs   CBC: No results for input(s): WBC, NEUTROABS, HGB, HCT, MCV, PLT in the last 168 hours.  Basic Metabolic Panel: No results for input(s): NA, K, CL, CO2, GLUCOSE, BUN, CREATININE, CALCIUM, MG, PHOS in the last 168 hours. GFR: CrCl cannot be calculated (No successful lab value found.). No results for input(s): PROCALCITON, WBC, LATICACIDVEN in the last 168 hours.  Liver  Function Tests: No results for input(s): AST, ALT, ALKPHOS, BILITOT, PROT, ALBUMIN in the last 168 hours. No results for input(s): LIPASE, AMYLASE in the last 168 hours. No results for input(s): AMMONIA in the last 168 hours.  ABG No results found for: PHART, PCO2ART, PO2ART, HCO3, TCO2, ACIDBASEDEF, O2SAT   Coagulation Profile: No results for input(s): INR, PROTIME in the last 168 hours.  Cardiac Enzymes: No results for input(s): CKTOTAL, CKMB, CKMBINDEX, TROPONINI in the last 168 hours.  HbA1C: No results found for: HGBA1C  CBG: No results for input(s): GLUCAP in the last 168 hours.  Review of Systems:   Unable to obtain since comatose  Past Medical History:  She,  has no past medical history on file.   Surgical History:  None   Social History:      Family History:  Her family history is not on file.   Allergies Not on File   Home Medications  Prior to Admission medications   Not on File  Brought in pill bottles of zolpidem and clonazepam  Critical care time: [place] MD. [facility]. [place] [facility] Pager : [phone]  If no response to pager , please call [phone] until 7 pm After 7:00 pm call Elink  [phone]   06/17/2021

## 2021-06-20 NOTE — ED Triage Notes (Signed)
Pt here via EMS d/t unresponsiveness. Pt found on floor pulseless. EMS performed CPR for 15 mins. 2mg  narcan, 3 X epi. ROSC achieved at a rate 160. 159mL NS  Pt arrived intubated. 18 Lhand IO right leg

## 2021-06-21 ENCOUNTER — Inpatient Hospital Stay (HOSPITAL_COMMUNITY): Payer: Medicaid Other

## 2021-06-21 DIAGNOSIS — I469 Cardiac arrest, cause unspecified: Secondary | ICD-10-CM | POA: Diagnosis not present

## 2021-06-21 DIAGNOSIS — J80 Acute respiratory distress syndrome: Secondary | ICD-10-CM

## 2021-06-21 DIAGNOSIS — G9382 Brain death: Secondary | ICD-10-CM | POA: Diagnosis not present

## 2021-06-21 LAB — CBC
HCT: 44.2 % (ref 36.0–46.0)
Hemoglobin: 14.5 g/dL (ref 12.0–15.0)
MCH: 31.3 pg (ref 26.0–34.0)
MCHC: 32.8 g/dL (ref 30.0–36.0)
MCV: 95.5 fL (ref 80.0–100.0)
Platelets: 321 10*3/uL (ref 150–400)
RBC: 4.63 MIL/uL (ref 3.87–5.11)
RDW: 13.9 % (ref 11.5–15.5)
WBC: 16.5 10*3/uL — ABNORMAL HIGH (ref 4.0–10.5)
nRBC: 0 % (ref 0.0–0.2)

## 2021-06-21 LAB — POCT I-STAT 7, (LYTES, BLD GAS, ICA,H+H)
Acid-base deficit: 3 mmol/L — ABNORMAL HIGH (ref 0.0–2.0)
Acid-base deficit: 3 mmol/L — ABNORMAL HIGH (ref 0.0–2.0)
Acid-base deficit: 3 mmol/L — ABNORMAL HIGH (ref 0.0–2.0)
Acid-base deficit: 1 mmol/L (ref 0.0–2.0)
Acid-base deficit: 1 mmol/L (ref 0.0–2.0)
Bicarbonate: 26.1 mmol/L (ref 20.0–28.0)
Bicarbonate: 27.3 mmol/L (ref 20.0–28.0)
Bicarbonate: 26.5 mmol/L (ref 20.0–28.0)
Bicarbonate: 27.8 mmol/L (ref 20.0–28.0)
Bicarbonate: 26.2 mmol/L (ref 20.0–28.0)
Calcium, Ion: 1.3 mmol/L (ref 1.15–1.40)
Calcium, Ion: 1.28 mmol/L (ref 1.15–1.40)
Calcium, Ion: 1.27 mmol/L (ref 1.15–1.40)
Calcium, Ion: 1.27 mmol/L (ref 1.15–1.40)
Calcium, Ion: 1.21 mmol/L (ref 1.15–1.40)
HCT: 43 % (ref 36.0–46.0)
HCT: 43 % (ref 36.0–46.0)
HCT: 42 % (ref 36.0–46.0)
HCT: 42 % (ref 36.0–46.0)
HCT: 39 % (ref 36.0–46.0)
Hemoglobin: 14.6 g/dL (ref 12.0–15.0)
Hemoglobin: 14.6 g/dL (ref 12.0–15.0)
Hemoglobin: 14.3 g/dL (ref 12.0–15.0)
Hemoglobin: 14.3 g/dL (ref 12.0–15.0)
Hemoglobin: 13.3 g/dL (ref 12.0–15.0)
O2 Saturation: 95 %
O2 Saturation: 99 %
O2 Saturation: 98 %
O2 Saturation: 98 %
O2 Saturation: 98 %
Patient temperature: 37.4
Patient temperature: 37
Patient temperature: 37.2
Patient temperature: 37.4
Patient temperature: 37.4
Potassium: 4 mmol/L (ref 3.5–5.1)
Potassium: 4.4 mmol/L (ref 3.5–5.1)
Potassium: 4.5 mmol/L (ref 3.5–5.1)
Potassium: 4.5 mmol/L (ref 3.5–5.1)
Potassium: 3.7 mmol/L (ref 3.5–5.1)
Sodium: 150 mmol/L — ABNORMAL HIGH (ref 135–145)
Sodium: 149 mmol/L — ABNORMAL HIGH (ref 135–145)
Sodium: 149 mmol/L — ABNORMAL HIGH (ref 135–145)
Sodium: 148 mmol/L — ABNORMAL HIGH (ref 135–145)
Sodium: 149 mmol/L — ABNORMAL HIGH (ref 135–145)
TCO2: 28 mmol/L (ref 22–32)
TCO2: 30 mmol/L (ref 22–32)
TCO2: 28 mmol/L (ref 22–32)
TCO2: 30 mmol/L (ref 22–32)
TCO2: 28 mmol/L (ref 22–32)
pCO2 arterial: 66 mmHg (ref 32.0–48.0)
pCO2 arterial: 73.1 mmHg (ref 32.0–48.0)
pCO2 arterial: 66.2 mmHg (ref 32.0–48.0)
pCO2 arterial: 67 mmHg (ref 32.0–48.0)
pCO2 arterial: 52.1 mmHg — ABNORMAL HIGH (ref 32.0–48.0)
pH, Arterial: 7.207 — ABNORMAL LOW (ref 7.350–7.450)
pH, Arterial: 7.181 — CL (ref 7.350–7.450)
pH, Arterial: 7.211 — ABNORMAL LOW (ref 7.350–7.450)
pH, Arterial: 7.229 — ABNORMAL LOW (ref 7.350–7.450)
pH, Arterial: 7.312 — ABNORMAL LOW (ref 7.350–7.450)
pO2, Arterial: 98 mmHg (ref 83.0–108.0)
pO2, Arterial: 150 mmHg — ABNORMAL HIGH (ref 83.0–108.0)
pO2, Arterial: 138 mmHg — ABNORMAL HIGH (ref 83.0–108.0)
pO2, Arterial: 141 mmHg — ABNORMAL HIGH (ref 83.0–108.0)
pO2, Arterial: 113 mmHg — ABNORMAL HIGH (ref 83.0–108.0)

## 2021-06-21 LAB — MAGNESIUM: Magnesium: 1.7 mg/dL (ref 1.7–2.4)

## 2021-06-21 LAB — ABO/RH: ABO/RH(D): O POS

## 2021-06-21 LAB — URINE CULTURE: Culture: NO GROWTH

## 2021-06-21 LAB — GLUCOSE, CAPILLARY
Glucose-Capillary: 160 mg/dL — ABNORMAL HIGH (ref 70–99)
Glucose-Capillary: 132 mg/dL — ABNORMAL HIGH (ref 70–99)
Glucose-Capillary: 155 mg/dL — ABNORMAL HIGH (ref 70–99)

## 2021-06-21 LAB — PHOSPHORUS: Phosphorus: 3.6 mg/dL (ref 2.5–4.6)

## 2021-06-21 NOTE — Consult Note (Signed)
NEURO HOSPITALIST CONSULT NOTE   Requestig physician: Dr. Elsworth Soho  Reason for Consult: Found down unresponsive. No brainstem reflexes since 6 PM on Saturday  History obtained from:  Chart     HPI:                                                                                                                                          Sonya King is an 41 y.o. female with a PMHx of bipolar disorder, polysubstance abuse including opioid dependence treated with methadone, prior unintentional overdose, recent admission in December for out of hospital cardiac arrest vs near-arrest in the setting of overdose, who presented to the [facility] on Saturday after cardiac arrest. Her total downtime was unknown. She received at least 15 minutes of CPR with EMS. Narcan was administered without response. She was intubated in the field. There was aspirated matter noted in the initial ETT that had been placed by EMS when ETTs were switched in the ED. WBC was elevated at 22 and troponin was 50 on initial evaluation in the ED.   Exams in the ICU per CCM have been without any evidence for higher cortical function and with absent brainstem reflexes. Neurology has been consulted to assess and provide a second opinion regarding exam as it relates to possible brain death.   PMHx As above   No family history on file.           Social History: Polysubstance abuse  Not on File  MEDICATIONS:                                                                                                                     Scheduled:  chlorhexidine gluconate (MEDLINE KIT)  15 mL Mouth Rinse BID   Chlorhexidine Gluconate Cloth  6 each Topical Q0600   mouth rinse  15 mL Mouth Rinse 10 times per day   pantoprazole (PROTONIX) IV  40 mg Intravenous QHS   Continuous:  sodium chloride 10 mL/hr at 06/21/21 1200   ampicillin-sulbactam (UNASYN) IV 3 g (06/21/21 0958)   lactated ringers     norepinephrine (LEVOPHED)  Adult infusion 6 mcg/min (06/21/21 1200)   vasopressin 0.03 Units/min (06/21/21 1200)     ROS:  Unable to obtain due to coma.  ° ° °Blood pressure 98/76, pulse 94, temperature 99.3 °F (37.4 °C), temperature source Bladder, resp. rate (!) 34, height 5' 3" (1.6 m), weight 65.2 kg, SpO2 98 %. ° ° °General Examination:                                                                                                      ° °Physical Exam  °HEENT-  [place]/AT. Neck is with flaccid tone.    °Lungs- Intubated. Not overbreathing the vent.  °Extremities- No edema ° °Neurological Examination °Mental Status: GCS 3T. No responses to any external stimuli. No spontaneous movements. No evidence for higher cortical functions during passive observation of patient nor in response to multiple different stimuli.  °Cranial Nerves: °II: No blink to threat. Right pupil 6 mm and unreactive. Left pupil 6-7 mm, irregular and unreactive.   °III,IV, VI: Eyelids remain closed when passively shut and remain open when passively elevated. Eyes are at the midline with no spontaneous movement. Does not move eyes towards or away from visual stimuli. No oculocephalic reflex.  °V, VII: No corneal reflexes. Face with flaccid tone.  °VIII: No response to loud auditory stimuli or calling of her name.  °IX,X: No cough or gag reflex.  °XI: Unable to assess other than flaccid tone of SCMs.  °XII: Intubated °Motor/Sensory: °Flaccid tone x 4.  °No movement of upper extremities to any stimuli.  °There is minimal approximately 5 mm movement by internal rotation of left thigh to left noxious plantar stimulation and visible slight contraction of right quadriceps muscle without joint movement to right plantar stimulation that occurs once but is not reproducible.  °Deep Tendon Reflexes: Myotactic responses only, in upper and lower  extremities. Absent DTR's °Plantars: Mute bilaterally. Negative Hoffman's bilaterally  °Cerebellar/Gait: Unable to assess °  °Lab Results: °Basic Metabolic Panel: °Recent Labs  °Lab 07/02/2021 °0911 07/05/2021 °0947 06/19/2021 °1035 06/14/2021 °2053 07/02/2021 °2143 06/08/2021 °2226 06/21/2021 °2337 06/21/21 °0131 06/21/21 °0310 06/21/21 °0321 06/21/21 °0511  °NA 142 141   < > 147*   < > 151* 151* 150*  --  149* 149*  °K 4.7 4.7   < > 3.2*   < > 3.3* 3.8 4.0  --  4.4 4.5  °CL 105 106  --  113*  --   --   --   --   --   --   --   °CO2 17*  --   --  23  --   --   --   --   --   --   --   °GLUCOSE 277* 274*  --  178*  --   --   --   --   --   --   --   °BUN 10 12  --  14  --   --   --   --   --   --   --   °CREATININE 1.15* 0.80  --  0.71  --   --   --   --   --   --   --   °  CALCIUM 8.2*  --   --  9.5  --   --   --   --   --   --   --   °MG 2.6*  --   --   --   --   --   --   --  1.7  --   --   °PHOS 11.3*  --   --   --   --   --   --   --  3.6  --   --   ° < > = values in this interval not displayed.  ° ° °CBC: °Recent Labs  °Lab  °0911 06/16/2021 °0947 06/19/2021 °2337 06/21/21 °0131 06/21/21 °0310 06/21/21 °0321 06/21/21 °0511  °WBC 22.5*  --   --   --  16.5*  --   --   °HGB 13.3   < > 14.6 14.6 14.5 14.6 14.3  °HCT 43.9   < > 43.0 43.0 44.2 43.0 42.0  °MCV 103.1*  --   --   --  95.5  --   --   °PLT 364  --   --   --  321  --   --   ° < > = values in this interval not displayed.  ° ° °Cardiac Enzymes: °No results for input(s): CKTOTAL, CKMB, CKMBINDEX, TROPONINI in the last 168 hours. ° °Lipid Panel: °No results for input(s): CHOL, TRIG, HDL, CHOLHDL, VLDL, LDLCALC in the last 168 hours. ° °Imaging: °CT Head Wo Contrast ° °Result Date: 06/19/2021 °CLINICAL DATA:  Mental status change, alcohol/drug use, found down on floor pulseless status post CPR EXAM: CT HEAD WITHOUT CONTRAST TECHNIQUE: Contiguous axial images were obtained from the base of the skull through the vertex without intravenous contrast. RADIATION DOSE REDUCTION:  This exam was performed according to the departmental dose-optimization program which includes automated exposure control, adjustment of the mA and/or kV according to patient size and/or use of iterative reconstruction technique. COMPARISON:  05/22/2021 head CT FINDINGS: Brain: Near complete loss of gray-white differentiation and diffuse sulcal effacement throughout the bilateral cerebrum. Slit-like ventricles. Effacement of the basilar cisterns. No evidence of parenchymal hemorrhage or extra-axial fluid collection (pseudo-subarachnoid hemorrhage appearance due to low-density of cerebral cortex). No mass lesion or midline shift. Vascular: No acute abnormality. Skull: No evidence of calvarial fracture. Sinuses/Orbits: Prominent mucoperiosteal thickening throughout left greater than right maxillary sinuses. Frothy opacification of the bilateral maxillary sinuses and right sphenoid sinus. Other: Partial right mastoid effusion. Clear left mastoid air cells. IMPRESSION: 1. Near complete loss of gray-white differentiation and diffuse sulcal effacement throughout the bilateral cerebrum with diffuse sulcal effacement and effacement of the basilar cisterns. Slit-like ventricles. Findings are compatible with diffuse anoxic brain injury. 2. No evidence of parenchymal hemorrhage or extra-axial fluid collection (pseudo-subarachnoid hemorrhage appearance due to low-density of cerebral cortex). 3. Paranasal sinusitis and partial right mastoid effusion. Electronically Signed   By: Jason A Poff M.D.   On: 06/19/2021 13:23  ° °DG CHEST PORT 1 VIEW ° °Result Date: 07/07/2021 °CLINICAL DATA:  Status post intubation, status post CPR. EXAM: PORTABLE CHEST 1 VIEW COMPARISON:  June 20, 2021 (9:06 a.m.) FINDINGS: An endotracheal tube is seen with its distal tip approximately 1.9 cm from the carina. This is predominant stable in position. An enteric tube is in place with its distal tip overlying the expected region of the gastric fundus. A  stable, ill-defined opacity is seen along the medial aspect of the right lung base. Mild atelectasis is seen   within the left lung base. There is no evidence of a pleural effusion or pneumothorax. The heart size and mediastinal contours are within normal limits. The visualized skeletal structures are unremarkable. IMPRESSION: 1. Endotracheal tube and enteric tube positioning, as described above, without additional significant interval changes when compared to the prior study Electronically Signed   By: Virgina Norfolk M.D.   On: 07/04/2021 22:40   DG Chest Portable 1 View  Result Date: 06/16/2021 CLINICAL DATA:  Intubation EXAM: PORTABLE CHEST 1 VIEW COMPARISON:  05/23/2021 FINDINGS: Endotracheal tube with tip at the clavicular heads. The enteric tube at least reaches the stomach. Elevated right diaphragm with hazy density. No effusion or pneumothorax. Normal heart size and mediastinal contours. IMPRESSION: 1. Unremarkable hardware. 2. Opacity at the right base with volume loss suggesting atelectasis. Electronically Signed   By: Jorje Guild M.D.   On: 07/05/2021 09:35   ECHOCARDIOGRAM COMPLETE  Result Date: 06/16/2021    ECHOCARDIOGRAM REPORT   Patient Name:   Thedora Hinders Date of Exam: 07/02/2021 Medical Rec #:  [id number]          Height:       63.0 in Accession #:    [id number]         Weight:       130.0 lb Date of Birth:  1980/08/15          BSA:          1.610 m Patient Age:    29 years           BP:           151/101 mmHg Patient Gender: F                  HR:           120 bpm. Exam Location:  Inpatient Procedure: 2D Echo STAT ECHO Indications:    Cardiomyopathy  History:        Patient has no prior history of Echocardiogram examinations.                 Cardiac arrest.  Sonographer:    Johny Chess RDCS Referring Phys: [id number] MADISON KOMMOR  Sonographer Comments: Echo performed with patient supine and on artificial respirator. Image acquisition challenging due to respiratory motion.  IMPRESSIONS  1. Left ventricular ejection fraction, by estimation, is 55 to 60%. The left ventricle has normal function. The left ventricle has no regional wall motion abnormalities. Left ventricular diastolic function could not be evaluated.  2. Right ventricular systolic function is normal. The right ventricular size is normal. Tricuspid regurgitation signal is inadequate for assessing PA pressure.  3. The mitral valve is normal in structure. No evidence of mitral valve regurgitation. No evidence of mitral stenosis.  4. The aortic valve was not well visualized. Aortic valve regurgitation is not visualized.  5. Mild pulmonic stenosis.  6. The inferior vena cava is normal in size with greater than 50% respiratory variability, suggesting right atrial pressure of 3 mmHg. Comparison(s): No prior Echocardiogram. FINDINGS  Left Ventricle: Left ventricular ejection fraction, by estimation, is 55 to 60%. The left ventricle has normal function. The left ventricle has no regional wall motion abnormalities. The left ventricular internal cavity size was small. There is no left ventricular hypertrophy. Left ventricular diastolic function could not be evaluated. Right Ventricle: The right ventricular size is normal. Right vetricular wall thickness was not well visualized. Right ventricular systolic function is normal. Tricuspid regurgitation signal is inadequate for  assessing PA pressure. Left Atrium: Left atrial size was normal in size. Right Atrium: Right atrial size was normal in size. Pericardium: There is no evidence of pericardial effusion. Mitral Valve: The mitral valve is normal in structure. No evidence of mitral valve regurgitation. No evidence of mitral valve stenosis. Tricuspid Valve: The tricuspid valve is normal in structure. Tricuspid valve regurgitation is not demonstrated. Aortic Valve: The aortic valve was not well visualized. Aortic valve regurgitation is not visualized. Pulmonic Valve: The pulmonic valve was  not well visualized. Pulmonic valve regurgitation is not visualized. Mild pulmonic stenosis. Aorta: The aortic root is normal in size and structure. Venous: The inferior vena cava is normal in size with greater than 50% respiratory variability, suggesting right atrial pressure of 3 mmHg. IAS/Shunts: No atrial level shunt detected by color flow Doppler.  LEFT VENTRICLE PLAX 2D LVIDd:         3.70 cm LVIDs:         2.50 cm LV PW:         0.80 cm LV IVS:        0.80 cm LVOT diam:     1.80 cm LV SV:         27 LV SV Index:   17 LVOT Area:     2.54 cm²  RIGHT VENTRICLE             IVC RV S prime:     11.90 cm/s  IVC diam: 1.60 cm TAPSE (M-mode): 1.7 cm LEFT ATRIUM           Index       RIGHT ATRIUM          Index LA Vol (A4C): 13.1 ml 8.14 ml/m²  RA Area:     7.54 cm²                                   RA Volume:   13.90 ml 8.63 ml/m²  AORTIC VALVE LVOT Vmax:   68.30 cm/s LVOT Vmean:  46.000 cm/s LVOT VTI:    0.105 m  AORTA Ao Root diam: 3.20 cm  SHUNTS Systemic VTI:  0.10 m Systemic Diam: 1.80 cm Mahesh Chandrasekhar MD Electronically signed by Mahesh Chandrasekhar MD Signature Date/Time: 07/06/2021/10:54:09 AM    Final   ° °CT Angio Chest/Abd/Pel for Dissection W and/or Wo Contrast ° °Result Date: 06/11/2021 °CLINICAL DATA:  40-year-old female with history of cardiac arrest found down on the floor pulseless. Status post CPR for 15 minutes. EXAM: CT ANGIOGRAPHY CHEST, ABDOMEN AND PELVIS TECHNIQUE: Non-contrast CT of the chest was initially obtained. Multidetector CT imaging through the chest, abdomen and pelvis was performed using the standard protocol during bolus administration of intravenous contrast. Multiplanar reconstructed images and MIPs were obtained and reviewed to evaluate the vascular anatomy. RADIATION DOSE REDUCTION: This exam was performed according to the departmental dose-optimization program which includes automated exposure control, adjustment of the mA and/or kV according to patient size and/or use of  iterative reconstruction technique. CONTRAST:  100mL OMNIPAQUE IOHEXOL 350 MG/ML SOLN COMPARISON:  Chest CTA 05/08/2017. CT the abdomen and pelvis 07/12/2020. FINDINGS: CTA CHEST FINDINGS Cardiovascular: Precontrast images demonstrate no crescentic high attenuation associated with the wall of the thoracic aorta to suggest acute intramural hemorrhage. No significant atherosclerotic disease, aneurysm or dissection noted in the thoracic aorta on postcontrast images. No definite coronary artery calcifications. Heart size is normal. There is no significant pericardial   fluid, thickening or pericardial calcification. Mediastinum/Nodes: Patient is intubated, with the tip of the endotracheal tube approximately 1 cm above the carina. No pathologically enlarged mediastinal or hilar lymph nodes. Nasogastric tube extending into the stomach. Esophagus is otherwise unremarkable in appearance. No axillary lymphadenopathy. Lungs/Pleura: No pneumothorax. The airways of the right lower lobe and to a lesser extent the posterior right upper lobe and medial left lower lobe are opacified with fluid attenuation material, suggesting massive aspiration. Associated with this, there is airspace consolidation and volume loss in the right lower lobe which is completely non aerated, and to a lesser extent in the medial aspect of the left lower lobe. Additional patchy areas of peribronchovascular ground-glass attenuation are noted elsewhere in the lungs bilaterally. Musculoskeletal: Acute minimally displaced fractures of the anterolateral right third rib, and nondisplaced fracture of the anterolateral right fourth rib are noted. There are no aggressive appearing lytic or blastic lesions noted in the visualized portions of the skeleton. Review of the MIP images confirms the above findings. CTA ABDOMEN AND PELVIS FINDINGS VASCULAR Aorta: Normal caliber aorta without aneurysm, dissection, vasculitis or significant stenosis. Celiac: Patent without  evidence of aneurysm, dissection, vasculitis or significant stenosis. SMA: Patent without evidence of aneurysm, dissection, vasculitis or significant stenosis. Renals: Both renal arteries are patent without evidence of aneurysm, dissection, vasculitis, fibromuscular dysplasia or significant stenosis. IMA: Patent without evidence of aneurysm, dissection, vasculitis or significant stenosis. Inflow: Patent without evidence of aneurysm, dissection, vasculitis or significant stenosis. Veins: No obvious venous abnormality within the limitations of this arterial phase study. Review of the MIP images confirms the above findings. NON-VASCULAR Hepatobiliary: No suspicious cystic or solid hepatic lesions. No intrahepatic biliary ductal dilatation. Status post cholecystectomy. Common bile duct measures 10 mm in the porta hepatis, likely reflective of benign post cholecystectomy physiology. Pancreas: No pancreatic mass. No pancreatic ductal dilatation. No pancreatic or peripancreatic fluid collections or inflammatory changes. Spleen: Unremarkable. Adrenals/Urinary Tract: Bilateral kidneys and adrenal glands are normal in appearance. No hydroureteronephrosis. Urinary bladder is largely decompressed with an indwelling Foley balloon catheter in place and large amount of gas within the lumen, presumably iatrogenic. Stomach/Bowel: Nasogastric tube extends into the stomach. Stomach is otherwise normal in appearance. No pathologic dilatation of small bowel or colon. The appendix is not confidently identified and may be surgically absent. Regardless, there are no inflammatory changes noted adjacent to the cecum to suggest the presence of an acute appendicitis at this time. Lymphatic: No lymphadenopathy noted in the abdomen or pelvis. Reproductive: Status post hysterectomy. Ovaries are not confidently identified may be surgically absent. Other: No significant volume of ascites.  No pneumoperitoneum. Musculoskeletal: There are no  aggressive appearing lytic or blastic lesions noted in the visualized portions of the skeleton. Review of the MIP images confirms the above findings. IMPRESSION: 1. Findings in the lungs suggest sequela of massive aspiration with widespread aspiration pneumonia/pneumonitis and substantial areas of atelectasis, most evident in the right lower lobe which is completely non aerated at this time. 2. Minimally displaced anterolateral right third rib fracture and nondisplaced anterolateral right fourth rib fracture. No associated pneumothorax. 3. No acute abnormality of the thoracoabdominal aorta. 4. No acute findings noted in the abdomen or pelvis. 5. Support apparatus, as above. Endotracheal tube is slightly low lying, within 1 cm of the carina. Repositioning of the endotracheal tube is advised. Electronically Signed   By: Daniel  Entrikin M.D.   On: 06/11/2021 13:46   ° ° °Assessment: 40 year old female who presented to the   [facility] on Saturday after cardiac arrest. Her total downtime was unknown. She received at least 15 minutes of CPR with EMS. No brainstem reflexes elicitable since 6 PM on Saturday. °1. Exam reveals no evidence for awareness of external stimuli, no evidence of any other cortical functions and absent brainstem reflexes. Findings are most consistent with brain death.  °2. CT head: Near complete loss of gray-white differentiation and diffuse sulcal effacement throughout the bilateral cerebrum with diffuse sulcal effacement and effacement of the basilar cisterns. Slit-like ventricles. Findings, in the context of the patient's history, are most consistent with diffuse anoxic brain injury. Images personally reviewed. ° ° °Recommendations: °1. Given the exam findings above, the patient most likely is brain dead with no expectation of any neurological recovery.  °2. I have discussed the case with Dr. Alva and we are in agreement regarding above findings most consistent with brain death. ° °35 minutes spent in  the neurological evaluation and management of this critically ill patient.   °  °Electronically signed: Dr.   °06/21/2021, 7:59 AM ° ° °   °

## 2021-06-21 NOTE — Progress Notes (Signed)
[place] Progress Note Patient Name: Sonya King DOB: [birth year] MRN: [id number]   Date of Service  06/21/2021  HPI/Events of Note  ABG on 100%/PRVC 26/TV 410/P 10 = 7.181/73.1/150/27.3  eICU Interventions  Plan: Increase PRVC to 34. Repeat ABG at 5 AM.     Intervention Category Major Interventions: Respiratory failure - evaluation and management;Acid-Base disturbance - evaluation and management  [name] 06/21/2021, 3:58 AM

## 2021-06-21 NOTE — Progress Notes (Signed)
Family arrived from [place] is her niece, Valinda Party is her cousin, 2 sons present but the oldest is 71.  This makes tie her brother from [place] who was driving in, her next of kin. Family was informed about clinical determination of brain death. They were accepting of this diagnosis Apnea test cannot be performed due to high FiO2 requirements Await confirmatory EEG test. DNR issued  Additional critical care time was 20 m  Joselinne Lawal V. Elsworth Soho MD

## 2021-06-21 NOTE — Progress Notes (Signed)
NAME:  Sonya King, MRN:  [id number], DOB:  [birth year], LOS: 1 ADMISSION DATE:  06/13/2021, CONSULTATION DATE:  06/15/2021 REFERRING MD:  Tinnie Gens, CHIEF COMPLAINT:  Cardiac arrest    History of Present Illness:  41 yo F PMH  Lupus, Bipolar, Polysubstance abuse, opioid dependence tx with methadone, unintentional overdose, recently admitted 05/22/21- 05/25/21 for overdose with OOH arrest vs OOH near arrest, who presented to [facility] 06/07/2021 post arrest. Total downtime unknown--received at least 15 min CPR with EMS receiving 3x epi. Given 2mg  narcan by EMS without response. Intubated in field (arrived to ED with 2 ETTs, one likely in esophagus), both removed and exchanged for 7.5 in ED, noted to have aspirate matter in initial ETTs.  CXR- r basilar opacity. ETT in appropriate position      Labs at admit CMP: -Co2 17, Cr 1.15 AG 20 Phos 11 Mag 2.6 iCal 1.07Albumin 3.3 AST 194 ALT ABG 7.1 7/58/58 on 50%/PEEP of 5 CBC: -WBC 22 Trop 50   Pertinent  Medical History    Significant Hospital Events: Including procedures, antibiotic start and stop dates in addition to other pertinent events   Head CT -diffuse cerebral edema CT angio chest/abdomen/pelvis -widespread pneumonitis?  Aspiration, right lower lobe atelectasis, right 3/4 rib fractures UDS positive for benzodiazepines  Interim History / Subjective:  Critically ill Events overnight reviewed, increase FiO2 requirements, PEEP added Good urine output. Remains on 9 mics of Levophed  Objective   Blood pressure 97/67, pulse (!) 104, temperature 99.3 F (37.4 C), temperature source Bladder, resp. rate (!) 34, height 5\' 3"  (1.6 m), weight 65.2 kg, SpO2 97 %.    Vent Mode: PRVC FiO2 (%):  [50 %-100 %] 90 % Set Rate:  [22 bmp-34 bmp] 34 bmp Vt Set:  [410 mL] 410 mL PEEP:  [5 cmH20-10 cmH20] 10 cmH20 Plateau Pressure:  [16 cmH20-29 cmH20] 23 cmH20   Intake/Output Summary (Last 24 hours) at 06/21/2021 0857 Last data filed at 06/21/2021  0800 Gross per 24 hour  Intake 3366.36 ml  Output 3830 ml  Net -463.64 ml   Filed Weights   06/30/2021 0910 06/07/2021 1330 06/21/21 0408  Weight: 59 kg 62.4 kg 65.2 kg    Examination: General: Young woman, orally intubated, comatose, no distress HENT: Pupils fixed and dilated, no doll's eye, mild pallor, no icterus, no JVD Lungs: Coarse breath sounds bilateral, no rhonchi, no accessory muscle use Cardiovascular: S1-S2 tacky, no murmur Abdomen: Soft, nontender Extremities: No deformity, no track marks on skin, multiple tattoos Neuro: Comatose, GCS 3, no response to deep pain stimulus, fixed dilated pupils, doll's eye absent, no cough or gag reflex, no spontaneous respiration GU: Clear urine  Chest x-ray dependently reviewed, ET tube in position, bilateral lower lobe infiltrates, right basilar atelectasis/effusion  Serial ABGs reviewed that show respiratory acidosis, slightly improved hypoxia with high FiO2 and PEEP Labs show mild hypernatremia, hypokalemia, leukocytosis  Echo shows normal LVEF   Resolved Hospital Problem list     Assessment & Plan:   OOH cardiac arrest with prolonged downtime -CPR x 15 min, unknown total downtime -recent admit for OOH arrest vs near arrest in setting of OD requiring narcan Cardiogenic versus septic shock P --avoid fever , normothermia protocol -Levophed to maintain MAP 65  Anoxic encephalopathy Diffuse cerebral edema Brain death P Exam is consistent with brain death - cannot proceed with apnea testing due to high vent requirements -We will obtain neuro input and declare clinically   ARDS Aspiration pneumonia -RLL opacity on  CXR -- likely some atelectasis  P -Continue Unasyn -PEEP/FiO2 per ARDS protocol, low tidal volume ventilation  Polysubstance abuse Chronic pain Opioid dependence UDS positive for benzodiazepines P  Based on clinical exam at 6 PM yesterday and again this morning with absent brainstem reflexes following a  catastrophic cardiac arrest and diffuse cerebral edema on imaging, we can declare brain death.  I have asked neurologist to provide independent evaluation.  Cannot proceed with apnea testing due to high vent requirements. Awaiting family members to arrive , poor prognosis has been conveyed already   [employer] (right click and "Reselect all SmartList Selections" daily)   Diet/type: NPO DVT prophylaxis: LMWH GI prophylaxis: PPI Lines: Central line Foley:  Yes, and it is still needed Code Status:  full code Last date of multidisciplinary goals of care discussion [NA , no family available]  Labs   CBC: Recent Labs  Lab 07/07/2021 0911 06/23/2021 0947 06/21/21 0131 06/21/21 0310 06/21/21 0321 06/21/21 0511 06/21/21 0744  WBC 22.5*  --   --  16.5*  --   --   --   HGB 13.3   < > 14.6 14.5 14.6 14.3 14.3  HCT 43.9   < > 43.0 44.2 43.0 42.0 42.0  MCV 103.1*  --   --  95.5  --   --   --   PLT 364  --   --  321  --   --   --    < > = values in this interval not displayed.    Basic Metabolic Panel: Recent Labs  Lab 06/07/2021 0911 06/30/2021 0947 06/11/2021 1035 06/14/2021 2053 06/15/2021 2143 06/19/2021 2337 06/21/21 0131 06/21/21 0310 06/21/21 0321 06/21/21 0511 06/21/21 0744  NA 142 141   < > 147*   < > 151* 150*  --  149* 149* 148*  K 4.7 4.7   < > 3.2*   < > 3.8 4.0  --  4.4 4.5 4.5  CL 105 106  --  113*  --   --   --   --   --   --   --   CO2 17*  --   --  23  --   --   --   --   --   --   --   GLUCOSE 277* 274*  --  178*  --   --   --   --   --   --   --   BUN 10 12  --  14  --   --   --   --   --   --   --   CREATININE 1.15* 0.80  --  0.71  --   --   --   --   --   --   --   CALCIUM 8.2*  --   --  9.5  --   --   --   --   --   --   --   MG 2.6*  --   --   --   --   --   --  1.7  --   --   --   PHOS 11.3*  --   --   --   --   --   --  3.6  --   --   --    < > = values in this interval not displayed.   GFR: Estimated Creatinine Clearance: 85.7 mL/min (by C-G formula based on  SCr of 0.71 mg/dL). Recent Labs  Lab 06/21/2021 0911 06/16/2021 1358 06/19/2021 1953 06/21/21 0310  PROCALCITON <0.10  --   --   --   WBC 22.5*  --   --  16.5*  LATICACIDVEN >9.0* 2.8* 1.6  --     Liver Function Tests: Recent Labs  Lab 06/07/2021 0911  AST 194*  ALT 48*  ALKPHOS 121  BILITOT 0.5  PROT 6.4*  ALBUMIN 3.3*   No results for input(s): LIPASE, AMYLASE in the last 168 hours. No results for input(s): AMMONIA in the last 168 hours.  ABG    Component Value Date/Time   PHART 7.229 (L) 06/21/2021 0744   PCO2ART 67.0 (HH) 06/21/2021 0744   PO2ART 141 (H) 06/21/2021 0744   HCO3 27.8 06/21/2021 0744   TCO2 30 06/21/2021 0744   ACIDBASEDEF 1.0 06/21/2021 0744   O2SAT 98.0 06/21/2021 0744     Coagulation Profile: Recent Labs  Lab 07/06/2021 0911  INR 1.1    Cardiac Enzymes: No results for input(s): CKTOTAL, CKMB, CKMBINDEX, TROPONINI in the last 168 hours.  HbA1C: No results found for: HGBA1C  CBG: Recent Labs  Lab 07/05/2021 1340 06/25/2021 1949 06/21/21 0739  GLUCAP 179* 185* 160*    Critical care time: [place] MD. FCCP. [facility] Pager : [phone]  If no response to pager , please call [phone] until 7 pm After 7:00 pm call Elink  [phone]   06/21/2021

## 2021-06-21 NOTE — Progress Notes (Addendum)
[facility] Progress Note Patient Name: Celisse A. Meikle DOB: December 26, 1980 MRN: [id number]   Date of Service  06/21/2021  HPI/Events of Note  ABG on 100%/PRVC 28/TV 410/P 10 = 7.254/57.8/71/27  eICU Interventions  Plan: Increase PRVC rate to 32. Repeat ABG at 1:30 AM.     Intervention Category Major Interventions: Acid-Base disturbance - evaluation and management;Respiratory failure - evaluation and management  Lysle Dingwall 06/21/2021, 12:33 AM

## 2021-06-21 NOTE — Progress Notes (Signed)
[place] Progress Note Patient Name: Sonya King DOB: 1981-02-25 MRN: [id number]   Date of Service  06/21/2021  HPI/Events of Note  ABG on 100%/PRVC 34/TV 410/P 10 = 7.21/66.2/138/26.5  eICU Interventions  Continue present ventilator management.     Intervention Category Major Interventions: Respiratory failure - evaluation and management  Arabelle Bollig [place] 06/21/2021, 5:28 AM

## 2021-06-21 NOTE — Progress Notes (Signed)
EEG complete - results pending 

## 2021-06-21 NOTE — Procedures (Signed)
EEG Procedure CPT/Type of Study: [id number]; brain death exam Referring Provider: Elsworth Soho Primary Neurological Diagnosis: cardiac arrest  History: This is a 41 yr old patient, undergoing an EEG to evaluate for cardiac arrest. Clinical State: comatose  Technical Description:  The EEG was performed using standard setting per the guidelines of American Clinical Neurophysiology Society (ACNS). Brain death protocol examination followed per ACNS. Duration: 52 minutes  EEG Description: Overall Amplitude:suppressed Predominant Frequency: predominantly suppressed background, however sparse very low voltage 1-2hz  activity was seen rarely. Some machine and EMG artifact was present at times.  Superimposed Frequencies: none The background was symmetric  Background Abnormalities: Near complete background suppression Rhythmic or periodic pattern: No Epileptiform activity: no Electrographic seizures: no Events: no   Breach rhythm: no  Reactivity: Absent  Stimulation procedures:  Hyperventilation: not done Photic stimulation: no change  Sleep Background: none  EKG:no significant arrhythmia  Impression: This was a markedly abnormal EEG due to near-complete background suppression and absent reactivity. Some mild motor responses were reported during stimulation procedures. This study was indicative of a severe anoxic injury pattern, however it did not fulfill criteria for ECI/brain death.

## 2021-06-21 NOTE — Progress Notes (Signed)
RT NOTE: patient did not meet SBT requirements for this AM due to PEEP and FIO2 needs.  Tolerating current ventilator settings well at this time.  Will continue to monitor.

## 2021-06-21 NOTE — Progress Notes (Addendum)
[facility] Progress Note Patient Name: Sonya King DOB: 10/31/1980 MRN: [id number]   Date of Service  06/21/2021  HPI/Events of Note  ABG on 100%/PRVC 32/TV 410/P 10 = 7.20/66.0/98/28. Paradoxical results. Her pCO2 is going up with increasing rates. She does not appear to be air trapping when looking at her Flow/time graph on the ventilator.  eICU Interventions  Plan: Decrease rate to 26. Repeat ABG in 1 hour.     Intervention Category Major Interventions: Respiratory failure - evaluation and management;Acid-Base disturbance - evaluation and management  Kjell Brannen [place] 06/21/2021, 2:05 AM

## 2021-06-22 ENCOUNTER — Inpatient Hospital Stay (HOSPITAL_COMMUNITY): Payer: Medicaid Other

## 2021-06-22 ENCOUNTER — Other Ambulatory Visit (HOSPITAL_COMMUNITY): Payer: Medicaid Other

## 2021-06-22 LAB — POCT I-STAT 7, (LYTES, BLD GAS, ICA,H+H)
Acid-Base Excess: 1 mmol/L (ref 0.0–2.0)
Acid-Base Excess: 3 mmol/L — ABNORMAL HIGH (ref 0.0–2.0)
Acid-Base Excess: 0 mmol/L (ref 0.0–2.0)
Bicarbonate: 26.6 mmol/L (ref 20.0–28.0)
Bicarbonate: 28.2 mmol/L — ABNORMAL HIGH (ref 20.0–28.0)
Bicarbonate: 24.5 mmol/L (ref 20.0–28.0)
Calcium, Ion: 1.22 mmol/L (ref 1.15–1.40)
Calcium, Ion: 1.41 mmol/L — ABNORMAL HIGH (ref 1.15–1.40)
Calcium, Ion: 1.43 mmol/L — ABNORMAL HIGH (ref 1.15–1.40)
HCT: 39 % (ref 36.0–46.0)
HCT: 35 % — ABNORMAL LOW (ref 36.0–46.0)
HCT: 33 % — ABNORMAL LOW (ref 36.0–46.0)
Hemoglobin: 13.3 g/dL (ref 12.0–15.0)
Hemoglobin: 11.9 g/dL — ABNORMAL LOW (ref 12.0–15.0)
Hemoglobin: 11.2 g/dL — ABNORMAL LOW (ref 12.0–15.0)
O2 Saturation: 95 %
O2 Saturation: 100 %
O2 Saturation: 98 %
Patient temperature: 36.6
Patient temperature: 36.3
Patient temperature: 36.4
Potassium: 3.6 mmol/L (ref 3.5–5.1)
Potassium: 3.3 mmol/L — ABNORMAL LOW (ref 3.5–5.1)
Potassium: 3.2 mmol/L — ABNORMAL LOW (ref 3.5–5.1)
Sodium: 146 mmol/L — ABNORMAL HIGH (ref 135–145)
Sodium: 166 mmol/L (ref 135–145)
Sodium: 165 mmol/L (ref 135–145)
TCO2: 28 mmol/L (ref 22–32)
TCO2: 30 mmol/L (ref 22–32)
TCO2: 26 mmol/L (ref 22–32)
pCO2 arterial: 45.1 mmHg (ref 32.0–48.0)
pCO2 arterial: 45 mmHg (ref 32.0–48.0)
pCO2 arterial: 39.7 mmHg (ref 32.0–48.0)
pH, Arterial: 7.377 (ref 7.350–7.450)
pH, Arterial: 7.402 (ref 7.350–7.450)
pH, Arterial: 7.396 (ref 7.350–7.450)
pO2, Arterial: 74 mmHg — ABNORMAL LOW (ref 83.0–108.0)
pO2, Arterial: 306 mmHg — ABNORMAL HIGH (ref 83.0–108.0)
pO2, Arterial: 100 mmHg (ref 83.0–108.0)

## 2021-06-22 LAB — AMYLASE: Amylase: 68 U/L (ref 28–100)

## 2021-06-22 LAB — COMPREHENSIVE METABOLIC PANEL
ALT: 28 U/L (ref 0–44)
ALT: 25 U/L (ref 0–44)
AST: 25 U/L (ref 15–41)
AST: 24 U/L (ref 15–41)
Albumin: 2.5 g/dL — ABNORMAL LOW (ref 3.5–5.0)
Albumin: 2.6 g/dL — ABNORMAL LOW (ref 3.5–5.0)
Alkaline Phosphatase: 85 U/L (ref 38–126)
Alkaline Phosphatase: 100 U/L (ref 38–126)
Anion gap: 10 (ref 5–15)
Anion gap: 10 (ref 5–15)
BUN: 11 mg/dL (ref 6–20)
BUN: 11 mg/dL (ref 6–20)
CO2: 28 mmol/L (ref 22–32)
CO2: 27 mmol/L (ref 22–32)
Calcium: 9.2 mg/dL (ref 8.9–10.3)
Calcium: 9.7 mg/dL (ref 8.9–10.3)
Chloride: 121 mmol/L — ABNORMAL HIGH (ref 98–111)
Chloride: 126 mmol/L — ABNORMAL HIGH (ref 98–111)
Creatinine, Ser: 0.74 mg/dL (ref 0.44–1.00)
Creatinine, Ser: 0.8 mg/dL (ref 0.44–1.00)
GFR, Estimated: >60 mL/min (ref >60)
GFR, Estimated: >60 mL/min (ref >60)
Glucose, Bld: 122 mg/dL — ABNORMAL HIGH (ref 70–99)
Glucose, Bld: 130 mg/dL — ABNORMAL HIGH (ref 70–99)
Potassium: 3.7 mmol/L (ref 3.5–5.1)
Potassium: 3.6 mmol/L (ref 3.5–5.1)
Sodium: 159 mmol/L — ABNORMAL HIGH (ref 135–145)
Sodium: 163 mmol/L (ref 135–145)
Total Bilirubin: 0.5 mg/dL (ref 0.3–1.2)
Total Bilirubin: 0.2 mg/dL — ABNORMAL LOW (ref 0.3–1.2)
Total Protein: 5.8 g/dL — ABNORMAL LOW (ref 6.5–8.1)
Total Protein: 6 g/dL — ABNORMAL LOW (ref 6.5–8.1)

## 2021-06-22 LAB — BASIC METABOLIC PANEL
Anion gap: 8 (ref 5–15)
BUN: 19 mg/dL (ref 6–20)
CO2: 25 mmol/L (ref 22–32)
Calcium: 8.4 mg/dL — ABNORMAL LOW (ref 8.9–10.3)
Chloride: 113 mmol/L — ABNORMAL HIGH (ref 98–111)
Creatinine, Ser: 0.52 mg/dL (ref 0.44–1.00)
GFR, Estimated: >60 mL/min (ref >60)
Glucose, Bld: 153 mg/dL — ABNORMAL HIGH (ref 70–99)
Potassium: 3.7 mmol/L (ref 3.5–5.1)
Sodium: 146 mmol/L — ABNORMAL HIGH (ref 135–145)

## 2021-06-22 LAB — HEMOGLOBIN A1C
Hgb A1c MFr Bld: 5.5 % (ref 4.8–5.6)
Mean Plasma Glucose: 111.15 mg/dL

## 2021-06-22 LAB — CBC
HCT: 35.1 % — ABNORMAL LOW (ref 36.0–46.0)
HCT: 39 % (ref 36.0–46.0)
Hemoglobin: 11.7 g/dL — ABNORMAL LOW (ref 12.0–15.0)
Hemoglobin: 12.8 g/dL (ref 12.0–15.0)
MCH: 31.5 pg (ref 26.0–34.0)
MCH: 31.6 pg (ref 26.0–34.0)
MCHC: 33.3 g/dL (ref 30.0–36.0)
MCHC: 32.8 g/dL (ref 30.0–36.0)
MCV: 94.4 fL (ref 80.0–100.0)
MCV: 96.3 fL (ref 80.0–100.0)
Platelets: 219 10*3/uL (ref 150–400)
Platelets: 245 10*3/uL (ref 150–400)
RBC: 3.72 MIL/uL — ABNORMAL LOW (ref 3.87–5.11)
RBC: 4.05 MIL/uL (ref 3.87–5.11)
RDW: 14.4 % (ref 11.5–15.5)
RDW: 14.8 % (ref 11.5–15.5)
WBC: 17.9 10*3/uL — ABNORMAL HIGH (ref 4.0–10.5)
WBC: 18.3 10*3/uL — ABNORMAL HIGH (ref 4.0–10.5)
nRBC: 0 % (ref 0.0–0.2)
nRBC: 0 % (ref 0.0–0.2)

## 2021-06-22 LAB — PROTIME-INR
INR: 1.2 (ref 0.8–1.2)
Prothrombin Time: 15.4 seconds — ABNORMAL HIGH (ref 11.4–15.2)

## 2021-06-22 LAB — LIPASE, BLOOD: Lipase: 20 U/L (ref 11–51)

## 2021-06-22 LAB — APTT: aPTT: 31 seconds (ref 24–36)

## 2021-06-22 LAB — CK TOTAL AND CKMB (NOT AT ARMC)
CK, MB: 3.5 ng/mL (ref 0.5–5.0)
Relative Index: INVALID (ref 0.0–2.5)
Total CK: 89 U/L (ref 38–234)

## 2021-06-22 LAB — PHOSPHORUS: Phosphorus: 1.4 mg/dL — ABNORMAL LOW (ref 2.5–4.6)

## 2021-06-22 LAB — LACTATE DEHYDROGENASE: LDH: 202 U/L — ABNORMAL HIGH (ref 98–192)

## 2021-06-22 LAB — MAGNESIUM: Magnesium: 2.3 mg/dL (ref 1.7–2.4)

## 2021-06-22 LAB — TROPONIN I (HIGH SENSITIVITY): Troponin I (High Sensitivity): 66 ng/L — ABNORMAL HIGH (ref <18)

## 2021-06-22 LAB — BILIRUBIN, DIRECT: Bilirubin, Direct: <0.1 mg/dL (ref 0.0–0.2)

## 2021-06-22 MED ORDER — POTASSIUM PHOSPHATES 15 MMOLE/5ML IV SOLN
15.0000 mmol | Freq: Once | INTRAVENOUS | Status: AC
  Administered 2021-06-22: 15 mmol via INTRAVENOUS
  Filled 2021-06-22: qty 5

## 2021-06-22 MED ORDER — SODIUM CHLORIDE 0.9 % IV SOLN
2000.0000 mg | Freq: Once | INTRAVENOUS | Status: AC
  Administered 2021-06-22: 2000 mg via INTRAVENOUS
  Filled 2021-06-22: qty 32

## 2021-06-22 MED ORDER — TECHNETIUM TC 99M EXAMETAZIME IV KIT
21.0000 | PACK | Freq: Once | INTRAVENOUS | Status: AC | PRN
  Administered 2021-06-22: 21 via INTRAVENOUS

## 2021-06-22 MED ORDER — DEXTROSE 50 % IV SOLN
50.0000 mL | Freq: Once | INTRAVENOUS | Status: AC
  Administered 2021-06-22: 50 mL via INTRAVENOUS
  Filled 2021-06-22: qty 50

## 2021-06-22 MED ORDER — FREE WATER: 200.0000 mL | Status: DC

## 2021-06-22 MED ORDER — LEVOTHYROXINE SODIUM 100 MCG/5ML IV SOLN
20.0000 ug | Freq: Once | INTRAVENOUS | Status: AC
  Administered 2021-06-22: 20 ug via INTRAVENOUS
  Filled 2021-06-22: qty 5

## 2021-06-22 MED ORDER — POTASSIUM CHLORIDE 20 MEQ PO PACK
40.0000 meq | PACK | Freq: Once | ORAL | Status: AC
  Administered 2021-06-22: 40 meq
  Filled 2021-06-22: qty 2

## 2021-06-22 MED ORDER — ENOXAPARIN SODIUM 40 MG/0.4ML IJ SOSY
40.0000 mg | PREFILLED_SYRINGE | INTRAMUSCULAR | Status: DC
  Administered 2021-06-22 – 2021-06-24 (×3): 40 mg via SUBCUTANEOUS
  Filled 2021-06-22 (×3): qty 0.4

## 2021-06-22 MED ORDER — DESMOPRESSIN ACETATE 4 MCG/ML IJ SOLN: 10.0000 ug | Freq: Once | INTRAMUSCULAR | Status: DC

## 2021-06-22 MED ORDER — SODIUM CHLORIDE 0.9 % IV SOLN
10.0000 ug/h | INTRAVENOUS | Status: DC
  Administered 2021-06-22: 10 ug/h via INTRAVENOUS
  Administered 2021-06-23 – 2021-06-24 (×3): 20 ug/h via INTRAVENOUS
  Filled 2021-06-22 (×6): qty 10

## 2021-06-22 MED ORDER — INSULIN ASPART 100 UNIT/ML IJ SOLN
20.0000 [IU] | Freq: Once | INTRAMUSCULAR | Status: AC
  Administered 2021-06-22: 20 [IU] via SUBCUTANEOUS

## 2021-06-22 MED ORDER — FREE WATER
300.0000 mL | Status: DC
  Administered 2021-06-22 – 2021-06-24 (×15): 300 mL

## 2021-06-22 MED ORDER — POLYVINYL ALCOHOL 1.4 % OP SOLN
1.0000 [drp] | Freq: Four times a day (QID) | OPHTHALMIC | Status: DC
  Administered 2021-06-22 – 2021-06-24 (×8): 1 [drp] via OPHTHALMIC
  Filled 2021-06-22: qty 15

## 2021-06-22 MED ORDER — SODIUM CHLORIDE 0.9 % IV BOLUS
1000.0000 mL | Freq: Once | INTRAVENOUS | Status: AC
Start: 2021-06-22 — End: 2021-06-22
  Administered 2021-06-22: 1000 mL via INTRAVENOUS

## 2021-06-22 MED ORDER — DESMOPRESSIN ACETATE 4 MCG/ML IJ SOLN
1.0000 ug | Freq: Once | INTRAMUSCULAR | Status: AC
  Administered 2021-06-22: 1 ug via INTRAVENOUS
  Filled 2021-06-22: qty 1

## 2021-06-22 NOTE — Progress Notes (Signed)
O2 challenge done per [facility], followed by ABG. Sputum culture sent down to lab.

## 2021-06-22 NOTE — Progress Notes (Addendum)
NAME:  Sonya King, MRN:  [id number], DOB:  [birth year], LOS: 2 ADMISSION DATE:  06/11/2021, CONSULTATION DATE:  06/27/2021 REFERRING MD:  Tinnie Gens, CHIEF COMPLAINT:  Cardiac arrest    History of Present Illness:  41 yo F PMH  Lupus, Bipolar, Polysubstance abuse, opioid dependence tx with methadone, unintentional overdose, recently admitted 05/22/21- 05/25/21 for overdose with OOH arrest vs OOH near arrest, who presented to [facility] 06/10/2021 post arrest. Total downtime unknown--received at least 15 min CPR with EMS receiving 3x epi. Given 2mg  narcan by EMS without response. Intubated in field (arrived to ED with 2 ETTs, one likely in esophagus), both removed and exchanged for 7.5 in ED, noted to have aspirate matter in initial ETTs.  CXR- r basilar opacity. ETT in appropriate position   Pertinent  Medical History    Significant Hospital Events: Including procedures, antibiotic start and stop dates in addition to other pertinent events   1/14 admitted post arrest Head CT -diffuse cerebral edema CT angio chest/abdomen/pelvis -widespread pneumonitis?  Aspiration, right lower lobe atelectasis, right 3/4 rib fractures UDS positive for benzodiazepines.  07/21/2022: EEG: "abnormal EEG due to near-complete background suppression and absent reactivity. Some mild motor responses were reported during stimulation procedures. This study was indicative of a severe anoxic injury pattern, however it did not fulfill criteria for ECI/brain death". Neuro consulted. Exam finding consistent w/ brqain death. Family made aware. Could not do apnea test due to high CO2 1/16 cerebral blood flow ordered Interim History / Subjective:  No sig change. Awaiting confirmatory testing   Objective   Blood pressure 95/61, pulse (Abnormal) 113, temperature 99 F (37.2 C), resp. rate (Abnormal) 34, height 5' 1.81" (1.57 m), weight 62.7 kg, SpO2 94 %.    Vent Mode: PRVC FiO2 (%):  [60 %-90 %] 60 % Set Rate:  [4 bmp-34 bmp] 34 bmp Vt  Set:  [470 mL] 470 mL PEEP:  [10 cmH20] 10 cmH20 Plateau Pressure:  [17 cmH20-25 cmH20] 23 cmH20   Intake/Output Summary (Last 24 hours) at Jul 22, 2021 0944 Last data filed at 2021/07/22 0900 Gross per 24 hour  Intake 1618 ml  Output 1280 ml  Net 338 ml   Filed Weights   06/25/2021 1330 07/21/21 0408 07/22/21 0000  Weight: 62.4 kg 65.2 kg 62.7 kg    Examination:  General 41 year old female. GCS 3 on vent and pressors HENT NCAT pupils fixed/dilated Card RRR Pulm coarse bilateral breath sounds Abd soft Ext warm  Neuro GCS 3  Resolved Hospital Problem list     Assessment & Plan:   OOH cardiac arrest with prolonged downtime: CPR x 15 min, unknown total downtime -recent admit for OOH arrest vs near arrest in setting of OD requiring narcan Cardiogenic versus septic shock Anoxic encephalopathy w/ Diffuse cerebral edema & exam c/w Brain death Exam is consistent with brain death ARDS Aspiration pneumonia Hypernatremia  Polysubstance abuse Chronic pain Opioid dependence UDS positive for benzodiazepines  Discussion Exam c/w brain death on 2022/07/21. EEG not meeting confirmatory criteria and not able to proceed w/ apnea testing due to Oxygen needs and hemodynamic instability. Family aware of prelim. Dx   Plan Proceed w/ nuc med perfusion test Cont current pressors (MAP goal > 65)  Adjust free water Day 3 of 5 unasyn  [facility] following apparently family on board w/ donation if meets criteria   Best Practice (right click and "Reselect all SmartList Selections" daily)   Diet/type: NPO DVT prophylaxis: LMWH GI prophylaxis: PPI Lines: Central line  Foley:  Yes, and it is still needed Code Status:  full code Last date of multidisciplinary goals of care discussion [NA , no family available]   Critical care time: 36 min    [name] ACNP-BC [place] Pager # [phone] OR # [phone] if no answer   2021/07/21

## 2021-06-22 NOTE — Progress Notes (Signed)
Lung recruitment completed per donor services. Pt currently on 100% FiO2 and peep of 5. ABG at 2230. RT will cont to monitor.

## 2021-06-22 NOTE — Plan of Care (Signed)

## 2021-06-22 NOTE — Progress Notes (Deleted)
Lung recruitment completed per donor services. Pt currently on 100% FiO2 and Peep of 5. ABG at 2200. RT will cont to monitor.

## 2021-06-22 NOTE — Progress Notes (Signed)
Chaplain responded to a request from the family of this patient for a visit.  Chaplain connected with the RN prior to going in.  Patient is being evaluated as an organ donor.  Family is present at bedside, siblings and patient has 2 children.  Chaplain provided ministry of presence, empathetic/reflective listening and words of comfort and support.  Chaplain provided prayer and allowed space for family to add their own words and feelings.  Patient and family identify as Darrick Meigs and find comfort in their faith and are hopeful she can live on in organ donation.  RN indicated it might be sometime tomorrow before anything happens.  Chaplain will pass along to day staff to check in with the family.  Chaplain available as needed for continued spiritual support. Lydia, Mdiv.    05-Jul-2021 1934  Clinical Encounter Type  Visited With Patient and family together;Health care provider  Visit Type Initial;Spiritual support;Critical Care;Patient actively dying  Referral From Family;Nurse  Consult/Referral To Chaplain  Spiritual Encounters  Spiritual Needs Prayer;Emotional;Grief support

## 2021-06-22 NOTE — Progress Notes (Signed)
[facility] ADULT ICU REPLACEMENT PROTOCOL   The patient does apply for the [facility] Adult ICU Electrolyte Replacment Protocol based on the criteria listed below:   1.Exclusion criteria: TCTS patients, ECMO patients, and Dialysis patients 2. Is GFR >/= 30 ml/min? Yes.    Patient's GFR today is >60 3. Is SCr </= 2? Yes.   Patient's SCr is 0.52  mg/dL 4. Did SCr increase >/= 0.5 in 24 hours? No. 5.Pt's weight >40kg  Yes.   6. Abnormal electrolyte(s): K+3.7  7. Electrolytes replaced per protocol 8.  Call MD STAT for K+ </= 2.5, Phos </= 1, or Mag </= 1 Physician:  Kendell Bane July 15, 2021 2:42 AM

## 2021-06-22 NOTE — Progress Notes (Signed)
Patient transported to nuclear med and back without complications. RN at bedside. 

## 2021-06-22 NOTE — Significant Event (Addendum)
Adult Brain Death Determination  Time of Examination: 07/03/21 5:56 PM  No Evidence of /Cause of Reversible CNS Depression  Core temperature must be greater >36 degrees. Last temp: Temp: 97.7 F (36.5 C) (Note: If unable to achieve normothermia after 12 hours of temperature management, may consider proceeding with Brain Death Evaluation.):    yes  Evidence of severe metabolic perturbations that could potentate CNS depression. Consider glucose, Na, creatinine, PaCO2, SaO2.:    Absent  Evidence of drugs, by history or measurement, that could potentiate central nervous system depression: narcotics, ethanol, benzodiazepines, barbiturates, neuromuscular blockade.:     Absent  Absence of Cortical Function  GCS = 3:    yes  Absence of Brain Stem Reflexes and Responses  Pupils light-fixed    yes  Corneal reflexes:    Absent  Response to upper and lower airway stimulation, such as pharyngeal and endotracheal suctioning.:    Absent  Ocular response to head turning (eye movement).    Absent  Absence of Spontaneous Respirations  (Apnea test performed per Brain Death Policy. If not met due to hemodynamic/ventilatory instability, then perform EEG, TCD, or cerebral blow flow studies.)  1.   Spontaneous Respirations   Absent  2.   PaCO2 at start of apnea test:  Unable to perform due to hemodynamic instability  3.   PaCO2 at end of apnea test:  Unable to perform due to hemodynamic instability  4.   CO2 rise of 20 or greater from baseline:   Unable to perform due to hemodynamic instability  Document Confirmatory Test Utilized: (Optional) Nuclear cerebral flow, cerebral angiography (CT/MR angio), transcranial Doppler ultrasound, EEG, SSEP (record results).  Test results (if available):  Nuclear cerebral blood flow shows no flow.  Patient pronounced dead by neurological criteria at 4:28PM on July 03, 2021.  [name], MD July 03, 2021 5:56 PM

## 2021-06-22 NOTE — Procedures (Signed)
Arterial Catheter Insertion Procedure Note  Sonya King  [id number]  05-04-1981  Date:07-21-2021  Time:6:22 PM    Provider Performing: Clementeen Graham    Procedure: Insertion of Arterial Line [phone]) with US guidance (74081)   Indication(s) Blood pressure monitoring and/or need for frequent ABGs  Consent obtained per request of organ donation   Anesthesia None   Time Out Verified patient identification, verified procedure, site/side was marked, verified correct patient position, special equipment/implants available, medications/allergies/relevant history reviewed, required imaging and test results available.   Sterile Technique Maximal sterile technique including full sterile barrier drape, hand hygiene, sterile gown, sterile gloves, mask, hair covering, sterile ultrasound probe cover (if used).   Procedure Description Area of catheter insertion was cleaned with chlorhexidine and draped in sterile fashion. With real-time ultrasound guidance an arterial catheter was placed into the left femoral artery.  Appropriate arterial tracings confirmed on monitor.     Complications/Tolerance None; patient tolerated the procedure well.   EBL Minimal   Specimen(s) None Sonya King ACNP-BC [place] Pager # [phone] OR # [phone] if no answer

## 2021-06-22 NOTE — Progress Notes (Signed)
[facility] Progress Note Patient Name: Sonya King DOB: 1981-02-17 MRN: [id number]   Date of Service  Jul 03, 2021  HPI/Events of Note  Oliguria - Urine output = 10-30 mL/hour. Femoral central line. LVEF = 55-60%. Last Creatinine = 0.71.  eICU Interventions  Plan: Bolus with 0.9 NaCl 1 liter IV over 1 hour now.      Intervention Category Major Interventions: Other:  Eryca Bolte Cornelia Copa 07/03/21, 12:00 AM

## 2021-06-22 NOTE — Progress Notes (Signed)
[facility] [place] Progress Note Patient Name: Sonya King DOB: 1981/05/06 MRN: [id number]   Date of Service  03-Jul-2021  HPI/Events of Note  [facility] requesting CXR, Q 4 hourly ABG's and periodic electrolyte checks.  eICU Interventions  CXR and ABG's ordered, Nursing Communication sent authorizing [facility] to order periodic electrolytes checks as necessary.        Kerry Kass Latanja Lehenbauer 07/03/2021, 8:29 PM

## 2021-06-22 NOTE — Progress Notes (Signed)
Bedside RN with [facility] requesting CXR, ABG's every 4 hours along with permission to place orders/replace electrolytes with MD's permission

## 2021-06-23 ENCOUNTER — Other Ambulatory Visit (HOSPITAL_COMMUNITY): Payer: Medicaid Other

## 2021-06-23 DIAGNOSIS — I469 Cardiac arrest, cause unspecified: Secondary | ICD-10-CM | POA: Diagnosis not present

## 2021-06-23 LAB — POCT I-STAT 7, (LYTES, BLD GAS, ICA,H+H)
Acid-Base Excess: 3 mmol/L — ABNORMAL HIGH (ref 0.0–2.0)
Acid-Base Excess: 3 mmol/L — ABNORMAL HIGH (ref 0.0–2.0)
Acid-Base Excess: 3 mmol/L — ABNORMAL HIGH (ref 0.0–2.0)
Acid-Base Excess: 0 mmol/L (ref 0.0–2.0)
Acid-Base Excess: 1 mmol/L (ref 0.0–2.0)
Acid-Base Excess: 1 mmol/L (ref 0.0–2.0)
Bicarbonate: 27.6 mmol/L (ref 20.0–28.0)
Bicarbonate: 26.4 mmol/L (ref 20.0–28.0)
Bicarbonate: 27.1 mmol/L (ref 20.0–28.0)
Bicarbonate: 22.7 mmol/L (ref 20.0–28.0)
Bicarbonate: 25.9 mmol/L (ref 20.0–28.0)
Bicarbonate: 26.8 mmol/L (ref 20.0–28.0)
Calcium, Ion: 1.35 mmol/L (ref 1.15–1.40)
Calcium, Ion: 1.33 mmol/L (ref 1.15–1.40)
Calcium, Ion: 1.32 mmol/L (ref 1.15–1.40)
Calcium, Ion: 1.3 mmol/L (ref 1.15–1.40)
Calcium, Ion: 1.33 mmol/L (ref 1.15–1.40)
Calcium, Ion: 1.31 mmol/L (ref 1.15–1.40)
HCT: 28 % — ABNORMAL LOW (ref 36.0–46.0)
HCT: 29 % — ABNORMAL LOW (ref 36.0–46.0)
HCT: 27 % — ABNORMAL LOW (ref 36.0–46.0)
HCT: 27 % — ABNORMAL LOW (ref 36.0–46.0)
HCT: 27 % — ABNORMAL LOW (ref 36.0–46.0)
HCT: 26 % — ABNORMAL LOW (ref 36.0–46.0)
Hemoglobin: 9.5 g/dL — ABNORMAL LOW (ref 12.0–15.0)
Hemoglobin: 9.9 g/dL — ABNORMAL LOW (ref 12.0–15.0)
Hemoglobin: 9.2 g/dL — ABNORMAL LOW (ref 12.0–15.0)
Hemoglobin: 9.2 g/dL — ABNORMAL LOW (ref 12.0–15.0)
Hemoglobin: 9.2 g/dL — ABNORMAL LOW (ref 12.0–15.0)
Hemoglobin: 8.8 g/dL — ABNORMAL LOW (ref 12.0–15.0)
O2 Saturation: 100 %
O2 Saturation: 100 %
O2 Saturation: 100 %
O2 Saturation: 95 %
O2 Saturation: 100 %
O2 Saturation: 100 %
Patient temperature: 36.7
Patient temperature: 36.4
Patient temperature: 36.4
Patient temperature: 98.6
Patient temperature: 97.7
Patient temperature: 36.5
Potassium: 3.4 mmol/L — ABNORMAL LOW (ref 3.5–5.1)
Potassium: 3 mmol/L — ABNORMAL LOW (ref 3.5–5.1)
Potassium: 3.2 mmol/L — ABNORMAL LOW (ref 3.5–5.1)
Potassium: 2.9 mmol/L — ABNORMAL LOW (ref 3.5–5.1)
Potassium: 3.1 mmol/L — ABNORMAL LOW (ref 3.5–5.1)
Potassium: 3.1 mmol/L — ABNORMAL LOW (ref 3.5–5.1)
Sodium: 163 mmol/L (ref 135–145)
Sodium: 160 mmol/L — ABNORMAL HIGH (ref 135–145)
Sodium: 161 mmol/L (ref 135–145)
Sodium: 163 mmol/L (ref 135–145)
Sodium: 163 mmol/L (ref 135–145)
Sodium: 164 mmol/L (ref 135–145)
TCO2: 29 mmol/L (ref 22–32)
TCO2: 28 mmol/L (ref 22–32)
TCO2: 28 mmol/L (ref 22–32)
TCO2: 24 mmol/L (ref 22–32)
TCO2: 27 mmol/L (ref 22–32)
TCO2: 28 mmol/L (ref 22–32)
pCO2 arterial: 40 mmHg (ref 32.0–48.0)
pCO2 arterial: 36 mmHg (ref 32.0–48.0)
pCO2 arterial: 35.7 mmHg (ref 32.0–48.0)
pCO2 arterial: 29 mmHg — ABNORMAL LOW (ref 32.0–48.0)
pCO2 arterial: 39.4 mmHg (ref 32.0–48.0)
pCO2 arterial: 46.2 mmHg (ref 32.0–48.0)
pH, Arterial: 7.446 (ref 7.350–7.450)
pH, Arterial: 7.472 — ABNORMAL HIGH (ref 7.350–7.450)
pH, Arterial: 7.486 — ABNORMAL HIGH (ref 7.350–7.450)
pH, Arterial: 7.502 — ABNORMAL HIGH (ref 7.350–7.450)
pH, Arterial: 7.423 (ref 7.350–7.450)
pH, Arterial: 7.369 (ref 7.350–7.450)
pO2, Arterial: 173 mmHg — ABNORMAL HIGH (ref 83.0–108.0)
pO2, Arterial: 228 mmHg — ABNORMAL HIGH (ref 83.0–108.0)
pO2, Arterial: 250 mmHg — ABNORMAL HIGH (ref 83.0–108.0)
pO2, Arterial: 69 mmHg — ABNORMAL LOW (ref 83.0–108.0)
pO2, Arterial: 299 mmHg — ABNORMAL HIGH (ref 83.0–108.0)
pO2, Arterial: 414 mmHg — ABNORMAL HIGH (ref 83.0–108.0)

## 2021-06-23 LAB — COMPREHENSIVE METABOLIC PANEL
ALT: 18 U/L (ref 0–44)
ALT: 20 U/L (ref 0–44)
ALT: 23 U/L (ref 0–44)
AST: 13 U/L — ABNORMAL LOW (ref 15–41)
AST: 14 U/L — ABNORMAL LOW (ref 15–41)
AST: 19 U/L (ref 15–41)
Albumin: 2.1 g/dL — ABNORMAL LOW (ref 3.5–5.0)
Albumin: 2.2 g/dL — ABNORMAL LOW (ref 3.5–5.0)
Albumin: 2.6 g/dL — ABNORMAL LOW (ref 3.5–5.0)
Alkaline Phosphatase: 73 U/L (ref 38–126)
Alkaline Phosphatase: 82 U/L (ref 38–126)
Alkaline Phosphatase: 87 U/L (ref 38–126)
Anion gap: 8 (ref 5–15)
Anion gap: 8 (ref 5–15)
BUN: 11 mg/dL (ref 6–20)
BUN: 13 mg/dL (ref 6–20)
BUN: 15 mg/dL (ref 6–20)
CO2: 23 mmol/L (ref 22–32)
CO2: 25 mmol/L (ref 22–32)
CO2: 25 mmol/L (ref 22–32)
Calcium: 8.8 mg/dL — ABNORMAL LOW (ref 8.9–10.3)
Calcium: 8.8 mg/dL — ABNORMAL LOW (ref 8.9–10.3)
Calcium: 8.9 mg/dL (ref 8.9–10.3)
Chloride: 128 mmol/L — ABNORMAL HIGH (ref 98–111)
Chloride: 129 mmol/L — ABNORMAL HIGH (ref 98–111)
Chloride: >130 mmol/L (ref 98–111)
Creatinine, Ser: 0.7 mg/dL (ref 0.44–1.00)
Creatinine, Ser: 0.73 mg/dL (ref 0.44–1.00)
Creatinine, Ser: 0.73 mg/dL (ref 0.44–1.00)
GFR, Estimated: >60 mL/min (ref >60)
GFR, Estimated: >60 mL/min (ref >60)
GFR, Estimated: >60 mL/min (ref >60)
Glucose, Bld: 137 mg/dL — ABNORMAL HIGH (ref 70–99)
Glucose, Bld: 142 mg/dL — ABNORMAL HIGH (ref 70–99)
Glucose, Bld: 152 mg/dL — ABNORMAL HIGH (ref 70–99)
Potassium: 3.1 mmol/L — ABNORMAL LOW (ref 3.5–5.1)
Potassium: 3.1 mmol/L — ABNORMAL LOW (ref 3.5–5.1)
Potassium: 3.2 mmol/L — ABNORMAL LOW (ref 3.5–5.1)
Sodium: 159 mmol/L — ABNORMAL HIGH (ref 135–145)
Sodium: 162 mmol/L (ref 135–145)
Sodium: 162 mmol/L (ref 135–145)
Total Bilirubin: 0.5 mg/dL (ref 0.3–1.2)
Total Bilirubin: 0.6 mg/dL (ref 0.3–1.2)
Total Bilirubin: 0.7 mg/dL (ref 0.3–1.2)
Total Protein: 5.1 g/dL — ABNORMAL LOW (ref 6.5–8.1)
Total Protein: 5.4 g/dL — ABNORMAL LOW (ref 6.5–8.1)
Total Protein: 5.6 g/dL — ABNORMAL LOW (ref 6.5–8.1)

## 2021-06-23 LAB — BILIRUBIN, DIRECT
Bilirubin, Direct: 0.1 mg/dL (ref 0.0–0.2)
Bilirubin, Direct: <0.1 mg/dL (ref 0.0–0.2)
Bilirubin, Direct: <0.1 mg/dL (ref 0.0–0.2)

## 2021-06-23 LAB — APTT
aPTT: 28 seconds (ref 24–36)
aPTT: 34 seconds (ref 24–36)
aPTT: 35 seconds (ref 24–36)

## 2021-06-23 LAB — PROTIME-INR
INR: 1.4 — ABNORMAL HIGH (ref 0.8–1.2)
INR: 1.5 — ABNORMAL HIGH (ref 0.8–1.2)
INR: 1.5 — ABNORMAL HIGH (ref 0.8–1.2)
Prothrombin Time: 16.7 seconds — ABNORMAL HIGH (ref 11.4–15.2)
Prothrombin Time: 17.7 seconds — ABNORMAL HIGH (ref 11.4–15.2)
Prothrombin Time: 17.9 seconds — ABNORMAL HIGH (ref 11.4–15.2)

## 2021-06-23 LAB — CBC
HCT: 29.5 % — ABNORMAL LOW (ref 36.0–46.0)
HCT: 30.7 % — ABNORMAL LOW (ref 36.0–46.0)
HCT: 31.4 % — ABNORMAL LOW (ref 36.0–46.0)
HCT: 36.9 % (ref 36.0–46.0)
Hemoglobin: 10.5 g/dL — ABNORMAL LOW (ref 12.0–15.0)
Hemoglobin: 11.8 g/dL — ABNORMAL LOW (ref 12.0–15.0)
Hemoglobin: 9.3 g/dL — ABNORMAL LOW (ref 12.0–15.0)
Hemoglobin: 9.8 g/dL — ABNORMAL LOW (ref 12.0–15.0)
MCH: 30.2 pg (ref 26.0–34.0)
MCH: 30.3 pg (ref 26.0–34.0)
MCH: 30.6 pg (ref 26.0–34.0)
MCH: 31.1 pg (ref 26.0–34.0)
MCHC: 31.5 g/dL (ref 30.0–36.0)
MCHC: 31.9 g/dL (ref 30.0–36.0)
MCHC: 32 g/dL (ref 30.0–36.0)
MCHC: 33.4 g/dL (ref 30.0–36.0)
MCV: 92.9 fL (ref 80.0–100.0)
MCV: 94.8 fL (ref 80.0–100.0)
MCV: 95.8 fL (ref 80.0–100.0)
MCV: 96.1 fL (ref 80.0–100.0)
Platelets: 168 10*3/uL (ref 150–400)
Platelets: 183 10*3/uL (ref 150–400)
Platelets: 185 10*3/uL (ref 150–400)
Platelets: 216 10*3/uL (ref 150–400)
RBC: 3.07 MIL/uL — ABNORMAL LOW (ref 3.87–5.11)
RBC: 3.24 MIL/uL — ABNORMAL LOW (ref 3.87–5.11)
RBC: 3.38 MIL/uL — ABNORMAL LOW (ref 3.87–5.11)
RBC: 3.85 MIL/uL — ABNORMAL LOW (ref 3.87–5.11)
RDW: 14.7 % (ref 11.5–15.5)
RDW: 14.8 % (ref 11.5–15.5)
RDW: 14.8 % (ref 11.5–15.5)
RDW: 14.9 % (ref 11.5–15.5)
WBC: 10.7 10*3/uL — ABNORMAL HIGH (ref 4.0–10.5)
WBC: 11 10*3/uL — ABNORMAL HIGH (ref 4.0–10.5)
WBC: 12.3 10*3/uL — ABNORMAL HIGH (ref 4.0–10.5)
WBC: 12.7 10*3/uL — ABNORMAL HIGH (ref 4.0–10.5)
nRBC: 0 % (ref 0.0–0.2)
nRBC: 0 % (ref 0.0–0.2)
nRBC: 0 % (ref 0.0–0.2)
nRBC: 0 % (ref 0.0–0.2)

## 2021-06-23 LAB — ECHOCARDIOGRAM COMPLETE
AR max vel: 2.79 cm2
AV Area VTI: 2.96 cm2
AV Area mean vel: 2.45 cm2
AV Mean grad: 2 mmHg
AV Peak grad: 2.7 mmHg
Ao pk vel: 0.81 m/s
Area-P 1/2: 3.16 cm2
Height: 61.811 in
S' Lateral: 2.8 cm
Weight: 2151.69 oz

## 2021-06-23 LAB — EXPECTORATED SPUTUM ASSESSMENT W GRAM STAIN, RFLX TO RESP C

## 2021-06-23 MED ORDER — PERFLUTREN LIPID MICROSPHERE
1.0000 mL | INTRAVENOUS | Status: AC | PRN
  Administered 2021-06-23: 2 mL via INTRAVENOUS
  Filled 2021-06-23: qty 10

## 2021-06-23 MED ORDER — ALBUMIN HUMAN 5 % IV SOLN
25.0000 g | Freq: Once | INTRAVENOUS | Status: AC
  Administered 2021-06-23: 25 g via INTRAVENOUS
  Filled 2021-06-23: qty 500

## 2021-06-23 MED ORDER — POTASSIUM CHLORIDE 20 MEQ PO PACK
40.0000 meq | PACK | Freq: Two times a day (BID) | ORAL | Status: AC
  Administered 2021-06-23 – 2021-06-24 (×3): 40 meq
  Filled 2021-06-23 (×3): qty 2

## 2021-06-23 MED ORDER — SODIUM CHLORIDE 0.45 % IV SOLN: INTRAVENOUS | Status: DC

## 2021-06-23 MED ORDER — K PHOS MONO-SOD PHOS DI & MONO 155-852-130 MG PO TABS
500.0000 mg | ORAL_TABLET | Freq: Two times a day (BID) | ORAL | Status: AC
  Administered 2021-06-23 (×2): 500 mg
  Filled 2021-06-23 (×2): qty 2

## 2021-06-23 NOTE — Progress Notes (Signed)
Lung recruitment completed per donor services. Pt currently on 100% FiO2 and peep of 5. ABG at 0439.

## 2021-06-23 NOTE — Progress Notes (Signed)
Lung recruitment completed per donor services. Pt currently on 100% FiO2 and 5 Peep. ABG in 30 mins.

## 2021-06-23 NOTE — Progress Notes (Signed)
Lung recruitment completed per donor services.  Pt currently on 100%FIO2 and peep 5.  ABG to follow in 30 min

## 2021-06-23 NOTE — Progress Notes (Signed)
Chaplain Melvenia Beam attempted follow up visit with patient family. Upon arrival met nurse in patient room. Nurse stated family had already left. Chaplain is available for follow up as requested.

## 2021-06-23 NOTE — Procedures (Signed)
Bronchoscopy Procedure Note  Sonya King  [id number]  04/20/1981  Date:06/23/21  Time:3:59 PM   Provider Performing:Jocob Dambach   Procedure(s):  Flexible bronchoscopy with bronchial alveolar lavage (25366)  Indication(s) Pre lung donation assessment  Consent Risks of the procedure as well as the alternatives and risks of each were explained to the patient and/or caregiver.  Consent for the procedure was obtained and is signed in the bedside chart  Anesthesia none   Time Out Verified patient identification, verified procedure, site/side was marked, verified correct patient position, special equipment/implants available, medications/allergies/relevant history reviewed, required imaging and test results available.   Sterile Technique Usual hand hygiene, masks, gowns, and gloves were used   Procedure Description Bronchoscope advanced through endotracheal tube and into airway.  Airways were examined down to subsegmental level with findings noted below.   Following diagnostic evaluation, BAL(s) performed in RML and LL with normal saline and return of 50ML turbid whitish  fluid  Findings: normal airway with minimal secretions.    Complications/Tolerance None; patient tolerated the procedure well. Chest X-ray is not needed post procedure.   EBL none   Specimen(s) BAL sent for culture and Covid testing  [name], MD [facility] ICU Physician [place]  Pager: [phone] Or Epic Secure Chat After hours: [phone].  06/23/2021, 4:01 PM

## 2021-06-24 ENCOUNTER — Encounter (HOSPITAL_COMMUNITY): Payer: Medicaid Other | Admitting: Certified Registered Nurse Anesthetist

## 2021-06-24 ENCOUNTER — Encounter (HOSPITAL_COMMUNITY): Admission: EM | Disposition: E | Payer: Self-pay | Source: Home / Self Care | Attending: Pulmonary Disease

## 2021-06-24 ENCOUNTER — Ambulatory Visit (HOSPITAL_COMMUNITY): Payer: Medicaid Other | Admitting: Certified Registered Nurse Anesthetist

## 2021-06-24 HISTORY — PX: ORGAN PROCUREMENT: SHX5270

## 2021-06-24 LAB — COMPREHENSIVE METABOLIC PANEL
ALT: 15 U/L (ref 0–44)
ALT: 14 U/L (ref 0–44)
AST: 12 U/L — ABNORMAL LOW (ref 15–41)
AST: 10 U/L — ABNORMAL LOW (ref 15–41)
Albumin: 2.4 g/dL — ABNORMAL LOW (ref 3.5–5.0)
Albumin: 2.3 g/dL — ABNORMAL LOW (ref 3.5–5.0)
Alkaline Phosphatase: 79 U/L (ref 38–126)
Alkaline Phosphatase: 76 U/L (ref 38–126)
BUN: 11 mg/dL (ref 6–20)
BUN: 14 mg/dL (ref 6–20)
CO2: 23 mmol/L (ref 22–32)
CO2: 23 mmol/L (ref 22–32)
Calcium: 8.5 mg/dL — ABNORMAL LOW (ref 8.9–10.3)
Calcium: 8.2 mg/dL — ABNORMAL LOW (ref 8.9–10.3)
Chloride: >130 mmol/L (ref 98–111)
Chloride: >130 mmol/L (ref 98–111)
Creatinine, Ser: 0.72 mg/dL (ref 0.44–1.00)
Creatinine, Ser: 0.69 mg/dL (ref 0.44–1.00)
GFR, Estimated: >60 mL/min (ref >60)
GFR, Estimated: >60 mL/min (ref >60)
Glucose, Bld: 147 mg/dL — ABNORMAL HIGH (ref 70–99)
Glucose, Bld: 148 mg/dL — ABNORMAL HIGH (ref 70–99)
Potassium: 3.5 mmol/L (ref 3.5–5.1)
Potassium: 3 mmol/L — ABNORMAL LOW (ref 3.5–5.1)
Sodium: 162 mmol/L (ref 135–145)
Sodium: 159 mmol/L — ABNORMAL HIGH (ref 135–145)
Total Bilirubin: 0.4 mg/dL (ref 0.3–1.2)
Total Bilirubin: 0.2 mg/dL — ABNORMAL LOW (ref 0.3–1.2)
Total Protein: 5.3 g/dL — ABNORMAL LOW (ref 6.5–8.1)
Total Protein: 5.3 g/dL — ABNORMAL LOW (ref 6.5–8.1)

## 2021-06-24 LAB — POCT I-STAT 7, (LYTES, BLD GAS, ICA,H+H)
Acid-Base Excess: 0 mmol/L (ref 0.0–2.0)
Acid-Base Excess: 1 mmol/L (ref 0.0–2.0)
Bicarbonate: 25.1 mmol/L (ref 20.0–28.0)
Bicarbonate: 25.8 mmol/L (ref 20.0–28.0)
Calcium, Ion: 1.27 mmol/L (ref 1.15–1.40)
Calcium, Ion: 1.29 mmol/L (ref 1.15–1.40)
HCT: 26 % — ABNORMAL LOW (ref 36.0–46.0)
HCT: 26 % — ABNORMAL LOW (ref 36.0–46.0)
Hemoglobin: 8.8 g/dL — ABNORMAL LOW (ref 12.0–15.0)
Hemoglobin: 8.8 g/dL — ABNORMAL LOW (ref 12.0–15.0)
O2 Saturation: 100 %
O2 Saturation: 100 %
Patient temperature: 36.7
Patient temperature: 37
Potassium: 3.1 mmol/L — ABNORMAL LOW (ref 3.5–5.1)
Potassium: 3.4 mmol/L — ABNORMAL LOW (ref 3.5–5.1)
Sodium: 165 mmol/L (ref 135–145)
Sodium: 165 mmol/L (ref 135–145)
TCO2: 26 mmol/L (ref 22–32)
TCO2: 27 mmol/L (ref 22–32)
pCO2 arterial: 42.6 mmHg (ref 32.0–48.0)
pCO2 arterial: 44.1 mmHg (ref 32.0–48.0)
pH, Arterial: 7.363 (ref 7.350–7.450)
pH, Arterial: 7.389 (ref 7.350–7.450)
pO2, Arterial: 350 mmHg — ABNORMAL HIGH (ref 83.0–108.0)
pO2, Arterial: 383 mmHg — ABNORMAL HIGH (ref 83.0–108.0)

## 2021-06-24 LAB — URINALYSIS, COMPLETE (UACMP) WITH MICROSCOPIC
Bacteria, UA: NONE SEEN
Bilirubin Urine: NEGATIVE
Glucose, UA: NEGATIVE mg/dL
Ketones, ur: NEGATIVE mg/dL
Leukocytes,Ua: NEGATIVE
Nitrite: NEGATIVE
Protein, ur: NEGATIVE mg/dL
Specific Gravity, Urine: 1.02 (ref 1.005–1.030)
pH: 5.5 (ref 5.0–8.0)

## 2021-06-24 LAB — CBC
HCT: 29.5 % — ABNORMAL LOW (ref 36.0–46.0)
HCT: 30.6 % — ABNORMAL LOW (ref 36.0–46.0)
Hemoglobin: 9.6 g/dL — ABNORMAL LOW (ref 12.0–15.0)
Hemoglobin: 9.9 g/dL — ABNORMAL LOW (ref 12.0–15.0)
MCH: 31.1 pg (ref 26.0–34.0)
MCH: 31 pg (ref 26.0–34.0)
MCHC: 32.5 g/dL (ref 30.0–36.0)
MCHC: 32.4 g/dL (ref 30.0–36.0)
MCV: 95.5 fL (ref 80.0–100.0)
MCV: 95.9 fL (ref 80.0–100.0)
Platelets: 166 10*3/uL (ref 150–400)
Platelets: 154 10*3/uL (ref 150–400)
RBC: 3.09 MIL/uL — ABNORMAL LOW (ref 3.87–5.11)
RBC: 3.19 MIL/uL — ABNORMAL LOW (ref 3.87–5.11)
RDW: 15.2 % (ref 11.5–15.5)
RDW: 15.2 % (ref 11.5–15.5)
WBC: 12.7 10*3/uL — ABNORMAL HIGH (ref 4.0–10.5)
WBC: 15.2 10*3/uL — ABNORMAL HIGH (ref 4.0–10.5)
nRBC: 0 % (ref 0.0–0.2)
nRBC: 0 % (ref 0.0–0.2)

## 2021-06-24 LAB — APTT
aPTT: 31 seconds (ref 24–36)
aPTT: 33 seconds (ref 24–36)

## 2021-06-24 LAB — PROTIME-INR
INR: 1.4 — ABNORMAL HIGH (ref 0.8–1.2)
INR: 1.5 — ABNORMAL HIGH (ref 0.8–1.2)
Prothrombin Time: 17.3 seconds — ABNORMAL HIGH (ref 11.4–15.2)
Prothrombin Time: 17.8 seconds — ABNORMAL HIGH (ref 11.4–15.2)

## 2021-06-24 LAB — URINE CULTURE
Culture: NO GROWTH
Special Requests: NORMAL

## 2021-06-24 LAB — BILIRUBIN, DIRECT
Bilirubin, Direct: <0.1 mg/dL (ref 0.0–0.2)
Bilirubin, Direct: <0.1 mg/dL (ref 0.0–0.2)

## 2021-06-24 SURGERY — SURGICAL PROCUREMENT, ORGAN
Anesthesia: General | Site: Abdomen

## 2021-06-24 MED ORDER — POTASSIUM CHLORIDE 10 MEQ/50ML IV SOLN
10.0000 meq | INTRAVENOUS | Status: AC
  Administered 2021-06-24 (×3): 10 meq via INTRAVENOUS
  Filled 2021-06-24 (×4): qty 50

## 2021-06-24 MED ORDER — PIPERACILLIN-TAZOBACTAM 3.375 G IVPB
3.3750 g | Freq: Once | INTRAVENOUS | Status: DC
Start: 2021-06-24 — End: 2021-06-24
  Filled 2021-06-24: qty 50

## 2021-06-24 MED ORDER — DESMOPRESSIN ACETATE 4 MCG/ML IJ SOLN
1.0000 ug | Freq: Once | INTRAMUSCULAR | Status: AC
  Administered 2021-06-24: 1 ug via INTRAVENOUS
  Filled 2021-06-24: qty 1

## 2021-06-24 MED ORDER — LABETALOL HCL 5 MG/ML IV SOLN
5.0000 mg | INTRAVENOUS | Status: AC
  Administered 2021-06-24: 5 mg via INTRAVENOUS

## 2021-06-24 MED ORDER — PHENYLEPHRINE 40 MCG/ML (10ML) SYRINGE FOR IV PUSH (FOR BLOOD PRESSURE SUPPORT)
PREFILLED_SYRINGE | INTRAVENOUS | Status: DC | PRN
Start: 2021-06-24 — End: 2021-06-24
  Administered 2021-06-24 (×4): 80 ug via INTRAVENOUS

## 2021-06-24 MED ORDER — VANCOMYCIN HCL IN DEXTROSE 1-5 GM/200ML-% IV SOLN
1000.0000 mg | Freq: Once | INTRAVENOUS | Status: AC
  Administered 2021-06-24: 1000 mg via INTRAVENOUS
  Filled 2021-06-24: qty 200

## 2021-06-24 MED ORDER — PIPERACILLIN-TAZOBACTAM 3.375 G IVPB 30 MIN
3.3750 g | Freq: Once | INTRAVENOUS | Status: AC
  Administered 2021-06-24: 3.375 g via INTRAVENOUS
  Filled 2021-06-24: qty 50

## 2021-06-24 MED ORDER — METHYLPREDNISOLONE SODIUM SUCC 1000 MG IJ SOLR
1000.0000 mg | Freq: Once | INTRAMUSCULAR | Status: AC
Start: 2021-06-24 — End: 2021-06-24
  Administered 2021-06-24: 1000 mg via INTRAVENOUS
  Filled 2021-06-24: qty 16

## 2021-06-24 MED ORDER — LABETALOL HCL 5 MG/ML IV SOLN
INTRAVENOUS | Status: AC
  Filled 2021-06-24: qty 4

## 2021-06-24 MED ORDER — ALBUMIN HUMAN 25 % IV SOLN
12.5000 g | Freq: Once | INTRAVENOUS | Status: AC
  Administered 2021-06-24: 12.5 g via INTRAVENOUS
  Filled 2021-06-24: qty 50

## 2021-06-24 MED ORDER — HEPARIN SODIUM (PORCINE) 1000 UNIT/ML IJ SOLN
INTRAMUSCULAR | Status: DC | PRN
  Administered 2021-06-24: 20000 [IU] via INTRAVENOUS

## 2021-06-24 MED ORDER — LACTATED RINGERS IV SOLN: INTRAVENOUS | Status: DC | PRN

## 2021-06-24 MED ORDER — ROCURONIUM BROMIDE 10 MG/ML (PF) SYRINGE
PREFILLED_SYRINGE | INTRAVENOUS | Status: DC | PRN
Start: 2021-06-24 — End: 2021-06-24
  Administered 2021-06-24: 100 mg via INTRAVENOUS

## 2021-06-24 MED ORDER — VASOPRESSIN 20 UNIT/ML IV SOLN
INTRAVENOUS | Status: DC | PRN
  Administered 2021-06-24: 2 [IU] via INTRAVENOUS
  Administered 2021-06-24: 3 [IU] via INTRAVENOUS
  Administered 2021-06-24: 2 [IU] via INTRAVENOUS
  Administered 2021-06-24: 1 [IU] via INTRAVENOUS

## 2021-06-24 MED ORDER — ALBUMIN HUMAN 5 % IV SOLN: INTRAVENOUS | Status: DC | PRN

## 2021-06-24 MED ORDER — 0.9 % SODIUM CHLORIDE (POUR BTL) OPTIME
TOPICAL | Status: DC | PRN
  Administered 2021-06-24: 5000 mL

## 2021-06-24 MED ORDER — PHENYLEPHRINE HCL-NACL 20-0.9 MG/250ML-% IV SOLN
INTRAVENOUS | Status: DC | PRN
  Administered 2021-06-24: 40 ug/min via INTRAVENOUS

## 2021-06-24 SURGICAL SUPPLY — 90 items
APPLIER CLIP 11 MED OPEN (CLIP) ×2
APR CLP MED 11 20 MLT OPN (CLIP) ×1
BAG COUNTER SPONGE SURGICOUNT (BAG) ×2 IMPLANT
BAG SPNG CNTER NS LX DISP (BAG) ×1
BLADE CLIPPER SURG (BLADE) IMPLANT
BLADE SAW STERNAL (BLADE) ×2 IMPLANT
BLADE STERNUM SYSTEM 6 (BLADE) ×1 IMPLANT
BLADE SURG 10 STRL SS (BLADE) IMPLANT
CLIP APPLIE 11 MED OPEN (CLIP) ×1 IMPLANT
CLIP VESOCCLUDE MED 24/CT (CLIP) IMPLANT
CLIP VESOCCLUDE SM WIDE 24/CT (CLIP) IMPLANT
CNTNR URN SCR LID CUP LEK RST (MISCELLANEOUS) ×1 IMPLANT
CONT SPEC 4OZ STRL OR WHT (MISCELLANEOUS) ×2
COVER BACK TABLE 60X90IN (DRAPES) ×1 IMPLANT
COVER MAYO STAND STRL (DRAPES) ×2 IMPLANT
COVER SURGICAL LIGHT HANDLE (MISCELLANEOUS) ×3 IMPLANT
DRAPE HALF SHEET 40X57 (DRAPES) ×2 IMPLANT
DRAPE SLUSH MACHINE 52X66 (DRAPES) ×2 IMPLANT
DRSG COVADERM 4X10 (GAUZE/BANDAGES/DRESSINGS) ×5 IMPLANT
DRSG TELFA 3X8 NADH (GAUZE/BANDAGES/DRESSINGS) ×2 IMPLANT
DURAPREP 26ML APPLICATOR (WOUND CARE) IMPLANT
ELECT BLADE 6.5 EXT (BLADE) IMPLANT
ELECT REM PT RETURN 9FT ADLT (ELECTROSURGICAL) ×4
ELECTRODE REM PT RTRN 9FT ADLT (ELECTROSURGICAL) ×2 IMPLANT
GAUZE 4X4 16PLY ~~LOC~~+RFID DBL (SPONGE) IMPLANT
GAUZE SPONGE 4X4 12PLY STRL (GAUZE/BANDAGES/DRESSINGS) ×2 IMPLANT
GAUZE SPONGE 4X4 16PLY XRAY LF (GAUZE/BANDAGES/DRESSINGS) ×1 IMPLANT
GLOVE SRG 8 PF TXTR STRL LF DI (GLOVE) IMPLANT
GLOVE SURG ENC MOIS LTX SZ7 (GLOVE) IMPLANT
GLOVE SURG ENC MOIS LTX SZ7.5 (GLOVE) IMPLANT
GLOVE SURG ENC MOIS LTX SZ8 (GLOVE) IMPLANT
GLOVE SURG ENC MOIS LTX SZ8.5 (GLOVE) IMPLANT
GLOVE SURG POLYISO LF SZ7 (GLOVE) IMPLANT
GLOVE SURG POLYISO LF SZ7.5 (GLOVE) IMPLANT
GLOVE SURG POLYISO LF SZ8 (GLOVE) IMPLANT
GLOVE SURG UNDER POLY LF SZ7 (GLOVE) IMPLANT
GLOVE SURG UNDER POLY LF SZ7.5 (GLOVE) IMPLANT
GLOVE SURG UNDER POLY LF SZ8 (GLOVE)
GLOVE SURG UNDER POLY LF SZ8.5 (GLOVE) IMPLANT
GOWN STRL REUS W/ TWL LRG LVL3 (GOWN DISPOSABLE) ×4 IMPLANT
GOWN STRL REUS W/ TWL XL LVL3 (GOWN DISPOSABLE) ×2 IMPLANT
GOWN STRL REUS W/TWL LRG LVL3 (GOWN DISPOSABLE) ×12
GOWN STRL REUS W/TWL XL LVL3 (GOWN DISPOSABLE) ×8
HANDLE SUCTION POOLE (INSTRUMENTS) IMPLANT
KIT POST MORTEM ADULT 36X90 (BAG) ×2 IMPLANT
KIT TURNOVER KIT B (KITS) ×2 IMPLANT
LOOP VESSEL MAXI BLUE (MISCELLANEOUS) IMPLANT
LOOP VESSEL MINI RED (MISCELLANEOUS) IMPLANT
MANIFOLD NEPTUNE II (INSTRUMENTS) ×3 IMPLANT
NDL BIOPSY 14X6 SOFT TISS (NEEDLE) IMPLANT
NEEDLE BIOPSY 14X6 SOFT TISS (NEEDLE) IMPLANT
NS IRRIG 1000ML POUR BTL (IV SOLUTION) ×5 IMPLANT
PACK AORTA (CUSTOM PROCEDURE TRAY) ×2 IMPLANT
PAD ARMBOARD 7.5X6 YLW CONV (MISCELLANEOUS) ×4 IMPLANT
PAD DRESSING TELFA 3X8 NADH (GAUZE/BANDAGES/DRESSINGS) ×1 IMPLANT
PENCIL BUTTON HOLSTER BLD 10FT (ELECTRODE) ×2 IMPLANT
SOL PREP POV-IOD 4OZ 10% (MISCELLANEOUS) ×4 IMPLANT
SPONGE INTESTINAL PEANUT (DISPOSABLE) IMPLANT
SPONGE T-LAP 18X18 ~~LOC~~+RFID (SPONGE) ×4 IMPLANT
STAPLER VISISTAT 35W (STAPLE) ×2 IMPLANT
SUCTION POOLE HANDLE (INSTRUMENTS)
SUT BONE WAX W31G (SUTURE) ×1 IMPLANT
SUT ETHIBOND 5 LR DA (SUTURE) IMPLANT
SUT ETHILON 1 LR 30 (SUTURE) ×4 IMPLANT
SUT ETHILON 2 LR (SUTURE) IMPLANT
SUT PROLENE 3 0 RB 1 (SUTURE) IMPLANT
SUT PROLENE 3 0 SH 1 (SUTURE) ×1 IMPLANT
SUT PROLENE 3 0 SH DA (SUTURE) ×3 IMPLANT
SUT PROLENE 4 0 RB 1 (SUTURE)
SUT PROLENE 4-0 RB1 .5 CRCL 36 (SUTURE) IMPLANT
SUT PROLENE 5 0 C 1 24 (SUTURE) IMPLANT
SUT PROLENE 6 0 BV (SUTURE) IMPLANT
SUT SILK 0 TIES 10X30 (SUTURE) IMPLANT
SUT SILK 1 SH (SUTURE) IMPLANT
SUT SILK 1 TIES 10X30 (SUTURE) IMPLANT
SUT SILK 2 0 (SUTURE)
SUT SILK 2 0 SH (SUTURE) IMPLANT
SUT SILK 2 0 SH CR/8 (SUTURE) IMPLANT
SUT SILK 2 0 TIES 10X30 (SUTURE) IMPLANT
SUT SILK 2-0 18XBRD TIE 12 (SUTURE) IMPLANT
SUT SILK 3 0 SH CR/8 (SUTURE) IMPLANT
SUT SILK 3 0 TIES 10X30 (SUTURE) IMPLANT
SWAB COLLECTION DEVICE MRSA (MISCELLANEOUS) IMPLANT
SWAB CULTURE ESWAB REG 1ML (MISCELLANEOUS) IMPLANT
SYR 50ML LL SCALE MARK (SYRINGE) IMPLANT
SYRINGE TOOMEY DISP (SYRINGE) IMPLANT
TAPE UMBILICAL 1/8 X36 TWILL (MISCELLANEOUS) ×2 IMPLANT
TUBE CONNECTING 12X1/4 (SUCTIONS) ×2 IMPLANT
WATER STERILE IRR 1000ML POUR (IV SOLUTION) IMPLANT
YANKAUER SUCT BULB TIP NO VENT (SUCTIONS) ×6 IMPLANT

## 2021-06-24 NOTE — Progress Notes (Signed)
Lung recruitment completed per donor services. ABG in 30 min.

## 2021-06-24 NOTE — OR Nursing (Signed)
Medical Examiner and pathology notified at [phone]

## 2021-06-24 NOTE — Anesthesia Preprocedure Evaluation (Signed)
Anesthesia Evaluation  Patient identified by MRN, date of birth, ID band Patient unresponsive    Reviewed: Allergy & Precautions, Patient's Chart, lab work & pertinent test results  Airway Mallampati: Intubated       Dental   Pulmonary neg pulmonary ROS,       + intubated    Cardiovascular negative cardio ROS   Rhythm:Regular Rate:Normal     Neuro/Psych negative neurological ROS     GI/Hepatic negative GI ROS, Neg liver ROS,   Endo/Other  negative endocrine ROS  Renal/GU negative Renal ROS     Musculoskeletal negative musculoskeletal ROS (+)   Abdominal   Peds  Hematology negative hematology ROS (+)   Anesthesia Other Findings   Reproductive/Obstetrics                             Anesthesia Physical Anesthesia Plan  ASA: 6  Anesthesia Plan: General   Post-op Pain Management:    Induction: Intravenous  PONV Risk Score and Plan: Treatment may vary due to age or medical condition  Airway Management Planned: Oral ETT  Additional Equipment: Arterial line and CVP  Intra-op Plan:   Post-operative Plan:   Informed Consent: I have reviewed the patients History and Physical, chart, labs and discussed the procedure including the risks, benefits and alternatives for the proposed anesthesia with the patient or authorized representative who has indicated his/her understanding and acceptance.       Plan Discussed with: CRNA  Anesthesia Plan Comments:         Anesthesia Quick Evaluation

## 2021-06-24 NOTE — Progress Notes (Signed)
Lung recruitment completed per donor services. Pt currently on 100% FiO2 and 5 peep. ABG in 30 mins.

## 2021-06-24 NOTE — OR Nursing (Addendum)
Organ procurement  Heart: 2017  Liver : 2028 Right Kidney: 2033 Left Kidney: 2037 Intestine: 2011 All Specimen given to travel MD and [facility]

## 2021-06-24 NOTE — Progress Notes (Incomplete)
Date and time results received: 06/21/2021 1000  (use smartphrase ".now" to insert current time)  Test: Chloride Critical Value: >130  Name of Provider Notified: Raquel Sarna with [facility]  Orders Received? Or Actions Taken?: {ED Critical Value actions [phone]

## 2021-06-24 NOTE — Anesthesia Postprocedure Evaluation (Signed)
Anesthesia Post Note  Patient: Sonya King  Procedure(s) Performed: ORGAN PROCUREMENT heart liver  kidneys intestines (Abdomen)     Anesthesia Type: General Level of consciousness: patient remains intubated per anesthesia plan Respiratory status: patient remains intubated per anesthesia plan Anesthetic complications: no Comments: Organ procurement    No notable events documented.  Last Vitals:  Vitals:   06/16/2021 1600 07/03/2021 1700  BP: 127/84 132/90  Pulse: 92 93  Resp: (!) 22 (!) 22  Temp: 36.5 C 36.6 C  SpO2: (!) 89% 90%    Last Pain:  Vitals:   06/10/2021 0400  TempSrc: Bladder  PainSc:                  [name]

## 2021-06-24 NOTE — Transfer of Care (Signed)
Immediate Anesthesia Transfer of Care Note  Patient: Sonya King  Procedure(s) Performed: ORGAN PROCUREMENT heart liver lungs kidneys intestines (Abdomen)  Patient Location:   Anesthesia Type:General  Level of Consciousness: unresponsive  Airway & Oxygen Therapy: Organ procurement  Post-op Assessment: no handoff  Post vital signs: Reviewed  Last Vitals:  Vitals Value Taken Time  BP    Temp    Pulse    Resp    SpO2      Last Pain:  Vitals:   06/19/2021 0400  TempSrc: Bladder  PainSc:          Complications: No notable events documented.

## 2021-06-25 ENCOUNTER — Encounter (HOSPITAL_COMMUNITY): Payer: Self-pay

## 2021-06-25 LAB — CULTURE, BLOOD (ROUTINE X 2)
Culture: NO GROWTH
Culture: NO GROWTH
Special Requests: ADEQUATE
Special Requests: ADEQUATE

## 2021-06-25 LAB — CULTURE, RESPIRATORY W GRAM STAIN: Gram Stain: NONE SEEN

## 2021-06-26 LAB — CULTURE, BAL-QUANTITATIVE W GRAM STAIN
Culture: >=100000 — AB
Gram Stain: NONE SEEN
Special Requests: NORMAL

## 2021-06-28 LAB — CULTURE, BLOOD (ROUTINE X 2)
Culture: NO GROWTH
Culture: NO GROWTH
Special Requests: ADEQUATE
Special Requests: ADEQUATE

## 2021-06-30 NOTE — Discharge Summary (Signed)
DEATH SUMMARY   Patient Details  Name: Sonya King MRN: [id number] DOB: Mar 12, 1981  Admission/Discharge Information   Admit Date:  15-Jul-2021  Date of Death: Date of Death: 17-Jul-2021  Time of Death: Time of Death: 08/11/26  Length of Stay: 4  Referring Physician: [facility] [place]   Reason(s) for Hospitalization  Overdose with cardiac arrest   Diagnoses  Preliminary cause of death: drug overdose Secondary Diagnoses (including complications and co-morbidities):  Principal Problem:   Cardiac arrest [facility]) Active Problems:   Brain death   Arterial line in place   Brief Hospital Course (including significant findings, care, treatment, and services provided and events leading to death)  Sonya King is a 41 y.o. year old female who presented to [facility] following cardiac arrest with unknown down time from suspected drug overdose with aspiration.  ROSC achieved after 28mn of CPR.  She had a history of  Lupus, Bipolar, Polysubstance abuse, opioid dependence tx with methadone, unintentional overdose, recently admitted 05/22/21- 05/25/21 for overdose with OOH arrest vs OOH near arrest.  Neurological status continued to worsen and CT head showed evolving cerebral edema.  Examination became consistent with brain death which was confirmed by cerebral blood flow study.   The patient had been previously elected to be an organ donor based on her [employer] license and the family provided secondary assent.   She underwent heart, liver, kidneys, and intestine organ procurement.   Pertinent Labs and Studies  Significant Diagnostic Studies DG Chest 1 View  Result Date: 06/23/2021 CLINICAL DATA:  Donor for lung transplant. EXAM: CHEST  1 VIEW COMPARISON:  AP chest 0February 10, 2023CT chest 0Feb 08, 2023002-08-23FINDINGS: Endotracheal tube tip terminates approximately 4.1 cm above the carina. Enteric tube descends below the diaphragm with the tip overlying the cardiac  stomach in the left upper quadrant, similar to prior. Cardiac silhouette and mediastinal contours are within normal limits. There is again heterogeneous airspace opacity overlying the left hemidiaphragm. Mild air bronchograms again overlie the inferior medial right lung presumably related to partial atelectasis where there was previously complete atelectasis of the right lower lobe on prior 02023/02/08CT. The current appearance is unchanged from yesterday's 0Feb 10, 2023frontal chest radiograph. No pneumothorax. No acute skeletal abnormality. IMPRESSION:: IMPRESSION: 1. No significant change in left basilar and medial right basilar airspace opacities. Note is made on prior 002-08-2023CT of complete right lower lobe atelectasis. On subsequent radiographs including the current study this may be improved from the prior CT. 2. Endotracheal tube in appropriate position. Electronically Signed   By: RYvonne KendallM.D.   On: 06/23/2021 09:04   CT Head Wo Contrast  Result Date: 12023/02/08CLINICAL DATA:  Mental status change, alcohol/drug use, found down on floor pulseless status post CPR EXAM: CT HEAD WITHOUT CONTRAST TECHNIQUE: Contiguous axial images were obtained from the base of the skull through the vertex without intravenous contrast. RADIATION DOSE REDUCTION: This exam was performed according to the departmental dose-optimization program which includes automated exposure control, adjustment of the mA and/or kV according to patient size and/or use of iterative reconstruction technique. COMPARISON:  05/22/2021 head CT FINDINGS: Brain: Near complete loss of gray-white differentiation and diffuse sulcal effacement throughout the bilateral cerebrum. Slit-like ventricles. Effacement of the basilar cisterns. No evidence of parenchymal hemorrhage or extra-axial fluid collection (pseudo-subarachnoid hemorrhage appearance due to low-density of cerebral cortex). No mass lesion or midline shift. Vascular: No acute abnormality.  Skull: No evidence of calvarial fracture. Sinuses/Orbits: Prominent mucoperiosteal thickening throughout left  greater than right maxillary sinuses. Frothy opacification of the bilateral maxillary sinuses and right sphenoid sinus. Other: Partial right mastoid effusion. Clear left mastoid air cells. IMPRESSION: 1. Near complete loss of gray-white differentiation and diffuse sulcal effacement throughout the bilateral cerebrum with diffuse sulcal effacement and effacement of the basilar cisterns. Slit-like ventricles. Findings are compatible with diffuse anoxic brain injury. 2. No evidence of parenchymal hemorrhage or extra-axial fluid collection (pseudo-subarachnoid hemorrhage appearance due to low-density of cerebral cortex). 3. Paranasal sinusitis and partial right mastoid effusion. Electronically Signed   By: Ilona Sorrel M.D.   On: 06/18/2021 13:23   NM Brain W Vasc Flow Min 4V  Result Date: 06/14/2021 CLINICAL DATA:  Brain death EXAM: NM BRAIN SCAN WITH FLOW - 4+ VIEW TECHNIQUE: Radionuclide angiogram and static images of the brain were obtained after intravenous injection of radiopharmaceutical. RADIOPHARMACEUTICALS:  21 millicuries technetium 99 Ceretec COMPARISON:  None. FINDINGS: Absent intracranial uptake throughout the dynamic flow and delayed images. IMPRESSION: As above.  Findings would support clinical diagnosis of brain death. Electronically Signed   By: Ofilia Neas M.D.   On: 06/29/2021 16:28   DG Chest Port 1 View  Result Date: 06/12/2021 CLINICAL DATA:  Workup for organ procurement. EXAM: PORTABLE CHEST 1 VIEW COMPARISON:  06/23/2021. FINDINGS: The heart size and mediastinal contours are within normal limits. Hazy airspace disease is present at the left lung base in the mid to lower lung field on the right. No definite effusion or pneumothorax. No acute osseous abnormality. Enteric tube terminates over the stomach. The endotracheal tube terminates 3 cm above the carina. IMPRESSION: 1.  Stable hazy airspace disease at the left lung base and mild patchy airspace disease in the mid to lower right lung field. 2. Medical devices as described above. Electronically Signed   By: Brett Fairy M.D.   On: 06/11/2021 20:55   DG Chest Port 1 View  Result Date: 07/04/2021 CLINICAL DATA:  ETT, CPR EXAM: PORTABLE CHEST 1 VIEW COMPARISON:  06/21/2021 FINDINGS: Unchanged AP portable chest radiograph with endotracheal tube and esophagogastric tube in appropriate positioning. Probable small left pleural effusion and or atelectasis or consolidation. The right lung is normally aerated. Osseous structures are unremarkable. IMPRESSION: 1. Unchanged AP portable chest radiograph with endotracheal tube and esophagogastric tube in appropriate positioning. 2. Probable small left pleural effusion and or atelectasis or consolidation. The right lung is normally aerated. Electronically Signed   By: Delanna Ahmadi M.D.   On: 06/30/2021 08:31   DG Chest Port 1 View  Result Date: 06/21/2021 CLINICAL DATA:  Intubated EXAM: PORTABLE CHEST 1 VIEW COMPARISON:  Chest radiograph from one day prior. FINDINGS: Endotracheal tube tip is 4.2 cm above the carina. Pacer pads overlie the heart. Enteric tube terminates in the gastric fundus. Stable cardiomediastinal silhouette with normal heart size. No pneumothorax. Possible new trace right pleural effusion. No significant left pleural effusion. No pulmonary edema. Hazy and streaky bilateral lower lobe opacities appear slightly increased on the right and stable on the left. IMPRESSION: 1. Well-positioned support structures. 2. Hazy and streaky bilateral lower lobe opacities, slightly increased on the right and stable on the left, either aspiration or atelectasis. 3. Possible new trace right pleural effusion. Electronically Signed   By: Ilona Sorrel M.D.   On: 06/21/2021 08:07   DG CHEST PORT 1 VIEW  Result Date: 06/19/2021 CLINICAL DATA:  Status post intubation, status post CPR. EXAM:  PORTABLE CHEST 1 VIEW COMPARISON:  June 20, 2021 (9:06 a.m.) FINDINGS: An endotracheal  tube is seen with its distal tip approximately 1.9 cm from the carina. This is predominant stable in position. An enteric tube is in place with its distal tip overlying the expected region of the gastric fundus. A stable, ill-defined opacity is seen along the medial aspect of the right lung base. Mild atelectasis is seen within the left lung base. There is no evidence of a pleural effusion or pneumothorax. The heart size and mediastinal contours are within normal limits. The visualized skeletal structures are unremarkable. IMPRESSION: 1. Endotracheal tube and enteric tube positioning, as described above, without additional significant interval changes when compared to the prior study Electronically Signed   By: Virgina Norfolk M.D.   On: 06/09/2021 22:40   DG Chest Portable 1 View  Result Date: 06/15/2021 CLINICAL DATA:  Intubation EXAM: PORTABLE CHEST 1 VIEW COMPARISON:  05/23/2021 FINDINGS: Endotracheal tube with tip at the clavicular heads. The enteric tube at least reaches the stomach. Elevated right diaphragm with hazy density. No effusion or pneumothorax. Normal heart size and mediastinal contours. IMPRESSION: 1. Unremarkable hardware. 2. Opacity at the right base with volume loss suggesting atelectasis. Electronically Signed   By: Jorje Guild M.D.   On: 06/29/2021 09:35   EEG adult  Result Date: 06/21/2021 Samuella Cota, MD     06/21/2021  8:20 PM EEG Procedure CPT/Type of Study: [phone]; brain death exam Referring Provider: Elsworth Soho Primary Neurological Diagnosis: cardiac arrest History: This is a 41 yr old patient, undergoing an EEG to evaluate for cardiac arrest. Clinical State: comatose Technical Description: The EEG was performed using standard setting per the guidelines of American Clinical Neurophysiology Society (ACNS). Brain death protocol examination followed per ACNS. Duration: 52 minutes EEG  Description: Overall Amplitude:suppressed Predominant Frequency: predominantly suppressed background, however sparse very low voltage 1-_0  activity was seen rarely. Some machine and EMG artifact was present at times. Superimposed Frequencies: none The background was symmetric Background Abnormalities: Near complete background suppression Rhythmic or periodic pattern: No Epileptiform activity: no Electrographic seizures: no Events: no Breach rhythm: no Reactivity: Absent Stimulation procedures: Hyperventilation: not done Photic stimulation: no change Sleep Background: none EKG:no significant arrhythmia Impression: This was a markedly abnormal EEG due to near-complete background suppression and absent reactivity. Some mild motor responses were reported during stimulation procedures. This study was indicative of a severe anoxic injury pattern, however it did not fulfill criteria for ECI/brain death.   ECHOCARDIOGRAM COMPLETE  Result Date: 06/23/2021    ECHOCARDIOGRAM REPORT   Patient Name:   Sonya King Date of Exam: 06/23/2021 Medical Rec #:  [id number]          Height:       61.8 in Accession #:    [id number]         Weight:       134.5 lb Date of Birth:  [birth year]          BSA:          1.611 m Patient Age:    59 years           BP:           121/74 mmHg Patient Gender: F                  HR:           76 bpm. Exam Location:  Inpatient Procedure: 2D Echo, Cardiac Doppler, Color Doppler and Intracardiac            Opacification Agent Indications:  Cardiac Arrest, organ donor  History:         Patient has prior history of Echocardiogram examinations, most                  recent 06/19/2021.  Sonographer:     Glo Herring Referring Phys:  [id number] Frederik Pear Diagnosing Phys: Eleonore Chiquito MD IMPRESSIONS  1. Left ventricular ejection fraction, by estimation, is 65 to 70%. The left ventricle has normal function. The left ventricle has no regional wall motion abnormalities. Left ventricular diastolic  parameters were normal.  2. Right ventricular systolic function is normal. The right ventricular size is normal. Tricuspid regurgitation signal is inadequate for assessing PA pressure.  3. The mitral valve is grossly normal. No evidence of mitral valve regurgitation. No evidence of mitral stenosis.  4. The aortic valve is tricuspid. Aortic valve regurgitation is not visualized. No aortic stenosis is present. FINDINGS  Left Ventricle: Left ventricular ejection fraction, by estimation, is 65 to 70%. The left ventricle has normal function. The left ventricle has no regional wall motion abnormalities. The left ventricular internal cavity size was normal in size. There is  no left ventricular hypertrophy. Left ventricular diastolic parameters were normal. Right Ventricle: The right ventricular size is normal. No increase in right ventricular wall thickness. Right ventricular systolic function is normal. Tricuspid regurgitation signal is inadequate for assessing PA pressure. Left Atrium: Left atrial size was normal in size. Right Atrium: Right atrial size was normal in size. Pericardium: There is no evidence of pericardial effusion. Mitral Valve: The mitral valve is grossly normal. No evidence of mitral valve regurgitation. No evidence of mitral valve stenosis. Tricuspid Valve: The tricuspid valve is grossly normal. Tricuspid valve regurgitation is not demonstrated. No evidence of tricuspid stenosis. Aortic Valve: The aortic valve is tricuspid. Aortic valve regurgitation is not visualized. No aortic stenosis is present. Aortic valve mean gradient measures 2.0 mmHg. Aortic valve peak gradient measures 2.7 mmHg. Aortic valve area, by VTI measures 2.96 cm. Pulmonic Valve: The pulmonic valve was grossly normal. Pulmonic valve regurgitation is not visualized. No evidence of pulmonic stenosis. Aorta: The aortic root is normal in size and structure. Venous: IVC assessment for right atrial pressure unable to be performed due to  mechanical ventilation. IAS/Shunts: The atrial septum is grossly normal.  LEFT VENTRICLE PLAX 2D LVIDd:         4.00 cm   Diastology LVIDs:         2.80 cm   LV e' medial:    7.13 cm/s LV PW:         0.90 cm   LV E/e' medial:  12.1 LV IVS:        0.90 cm   LV e' lateral:   11.90 cm/s LVOT diam:     2.00 cm   LV E/e' lateral: 7.3 LV SV:         41 LV SV Index:   25 LVOT Area:     3.14 cm  RIGHT VENTRICLE             IVC RV S prime:     13.70 cm/s  IVC diam: 2.30 cm LEFT ATRIUM         Index LA diam:    2.15 cm 1.33 cm/m  AORTIC VALVE                    PULMONIC VALVE AV Area (Vmax):    2.79 cm     PV  Vmax:       0.84 m/s AV Area (Vmean):   2.45 cm     PV Peak grad:  2.8 mmHg AV Area (VTI):     2.96 cm AV Vmax:           81.40 cm/s AV Vmean:          57.900 cm/s AV VTI:            0.138 m AV Peak Grad:      2.7 mmHg AV Mean Grad:      2.0 mmHg LVOT Vmax:         72.20 cm/s LVOT Vmean:        45.100 cm/s LVOT VTI:          0.130 m LVOT/AV VTI ratio: 0.94  AORTA Ao Root diam: 3.10 cm MITRAL VALVE MV Area (PHT): 3.16 cm    SHUNTS MV Decel Time: 240 msec    Systemic VTI:  0.13 m MV E velocity: 86.40 cm/s  Systemic Diam: 2.00 cm MV A velocity: 59.60 cm/s MV E/A ratio:  1.45 Eleonore Chiquito MD Electronically signed by Eleonore Chiquito MD Signature Date/Time: 06/23/2021/9:52:48 AM    Final (Updated)    ECHOCARDIOGRAM COMPLETE  Result Date: 06/25/2021    ECHOCARDIOGRAM REPORT   Patient Name:   Sonya King Date of Exam: 06/28/2021 Medical Rec #:  [id number]          Height:       63.0 in Accession #:    [id number]         Weight:       130.0 lb Date of Birth:  Oct 19, 1980          BSA:          1.610 m Patient Age:    22 years           BP:           151/101 mmHg Patient Gender: F                  HR:           120 bpm. Exam Location:  Inpatient Procedure: 2D Echo STAT ECHO Indications:    Cardiomyopathy  History:        Patient has no prior history of Echocardiogram examinations.                 Cardiac arrest.   Sonographer:    Johny Chess RDCS Referring Phys: [id number] MADISON KOMMOR  Sonographer Comments: Echo performed with patient supine and on artificial respirator. Image acquisition challenging due to respiratory motion. IMPRESSIONS  1. Left ventricular ejection fraction, by estimation, is 55 to 60%. The left ventricle has normal function. The left ventricle has no regional wall motion abnormalities. Left ventricular diastolic function could not be evaluated.  2. Right ventricular systolic function is normal. The right ventricular size is normal. Tricuspid regurgitation signal is inadequate for assessing PA pressure.  3. The mitral valve is normal in structure. No evidence of mitral valve regurgitation. No evidence of mitral stenosis.  4. The aortic valve was not well visualized. Aortic valve regurgitation is not visualized.  5. Mild pulmonic stenosis.  6. The inferior vena cava is normal in size with greater than 50% respiratory variability, suggesting right atrial pressure of 3 mmHg. Comparison(s): No prior Echocardiogram. FINDINGS  Left Ventricle: Left ventricular ejection fraction, by estimation, is 55 to 60%. The left ventricle has normal function. The left ventricle has  no regional wall motion abnormalities. The left ventricular internal cavity size was small. There is no left ventricular hypertrophy. Left ventricular diastolic function could not be evaluated. Right Ventricle: The right ventricular size is normal. Right vetricular wall thickness was not well visualized. Right ventricular systolic function is normal. Tricuspid regurgitation signal is inadequate for assessing PA pressure. Left Atrium: Left atrial size was normal in size. Right Atrium: Right atrial size was normal in size. Pericardium: There is no evidence of pericardial effusion. Mitral Valve: The mitral valve is normal in structure. No evidence of mitral valve regurgitation. No evidence of mitral valve stenosis. Tricuspid Valve: The tricuspid  valve is normal in structure. Tricuspid valve regurgitation is not demonstrated. Aortic Valve: The aortic valve was not well visualized. Aortic valve regurgitation is not visualized. Pulmonic Valve: The pulmonic valve was not well visualized. Pulmonic valve regurgitation is not visualized. Mild pulmonic stenosis. Aorta: The aortic root is normal in size and structure. Venous: The inferior vena cava is normal in size with greater than 50% respiratory variability, suggesting right atrial pressure of 3 mmHg. IAS/Shunts: No atrial level shunt detected by color flow Doppler.  LEFT VENTRICLE PLAX 2D LVIDd:         3.70 cm LVIDs:         2.50 cm LV PW:         0.80 cm LV IVS:        0.80 cm LVOT diam:     1.80 cm LV SV:         27 LV SV Index:   17 LVOT Area:     2.54 cm  RIGHT VENTRICLE             IVC RV S prime:     11.90 cm/s  IVC diam: 1.60 cm TAPSE (M-mode): 1.7 cm LEFT ATRIUM           Index       RIGHT ATRIUM          Index LA Vol (A4C): 13.1 ml 8.14 ml/m  RA Area:     7.54 cm                                   RA Volume:   13.90 ml 8.63 ml/m  AORTIC VALVE LVOT Vmax:   68.30 cm/s LVOT Vmean:  46.000 cm/s LVOT VTI:    0.105 m  AORTA Ao Root diam: 3.20 cm  SHUNTS Systemic VTI:  0.10 m Systemic Diam: 1.80 cm Rudean Haskell MD Electronically signed by Rudean Haskell MD Signature Date/Time: 06/21/2021/10:54:09 AM    Final    CT Angio Chest/Abd/Pel for Dissection W and/or Wo Contrast  Result Date: 06/16/2021 CLINICAL DATA:  41 year old female with history of cardiac arrest found down on the floor pulseless. Status post CPR for 15 minutes. EXAM: CT ANGIOGRAPHY CHEST, ABDOMEN AND PELVIS TECHNIQUE: Non-contrast CT of the chest was initially obtained. Multidetector CT imaging through the chest, abdomen and pelvis was performed using the standard protocol during bolus administration of intravenous contrast. Multiplanar reconstructed images and MIPs were obtained and reviewed to evaluate the vascular anatomy.  RADIATION DOSE REDUCTION: This exam was performed according to the departmental dose-optimization program which includes automated exposure control, adjustment of the mA and/or kV according to patient size and/or use of iterative reconstruction technique. CONTRAST:  172m OMNIPAQUE IOHEXOL 350 MG/ML SOLN COMPARISON:  Chest CTA 05/08/2017. CT the abdomen and pelvis  07/12/2020. FINDINGS: CTA CHEST FINDINGS Cardiovascular: Precontrast images demonstrate no crescentic high attenuation associated with the wall of the thoracic aorta to suggest acute intramural hemorrhage. No significant atherosclerotic disease, aneurysm or dissection noted in the thoracic aorta on postcontrast images. No definite coronary artery calcifications. Heart size is normal. There is no significant pericardial fluid, thickening or pericardial calcification. Mediastinum/Nodes: Patient is intubated, with the tip of the endotracheal tube approximately 1 cm above the carina. No pathologically enlarged mediastinal or hilar lymph nodes. Nasogastric tube extending into the stomach. Esophagus is otherwise unremarkable in appearance. No axillary lymphadenopathy. Lungs/Pleura: No pneumothorax. The airways of the right lower lobe and to a lesser extent the posterior right upper lobe and medial left lower lobe are opacified with fluid attenuation material, suggesting massive aspiration. Associated with this, there is airspace consolidation and volume loss in the right lower lobe which is completely non aerated, and to a lesser extent in the medial aspect of the left lower lobe. Additional patchy areas of peribronchovascular ground-glass attenuation are noted elsewhere in the lungs bilaterally. Musculoskeletal: Acute minimally displaced fractures of the anterolateral right third rib, and nondisplaced fracture of the anterolateral right fourth rib are noted. There are no aggressive appearing lytic or blastic lesions noted in the visualized portions of the  skeleton. Review of the MIP images confirms the above findings. CTA ABDOMEN AND PELVIS FINDINGS VASCULAR Aorta: Normal caliber aorta without aneurysm, dissection, vasculitis or significant stenosis. Celiac: Patent without evidence of aneurysm, dissection, vasculitis or significant stenosis. SMA: Patent without evidence of aneurysm, dissection, vasculitis or significant stenosis. Renals: Both renal arteries are patent without evidence of aneurysm, dissection, vasculitis, fibromuscular dysplasia or significant stenosis. IMA: Patent without evidence of aneurysm, dissection, vasculitis or significant stenosis. Inflow: Patent without evidence of aneurysm, dissection, vasculitis or significant stenosis. Veins: No obvious venous abnormality within the limitations of this arterial phase study. Review of the MIP images confirms the above findings. NON-VASCULAR Hepatobiliary: No suspicious cystic or solid hepatic lesions. No intrahepatic biliary ductal dilatation. Status post cholecystectomy. Common bile duct measures 10 mm in the porta hepatis, likely reflective of benign post cholecystectomy physiology. Pancreas: No pancreatic mass. No pancreatic ductal dilatation. No pancreatic or peripancreatic fluid collections or inflammatory changes. Spleen: Unremarkable. Adrenals/Urinary Tract: Bilateral kidneys and adrenal glands are normal in appearance. No hydroureteronephrosis. Urinary bladder is largely decompressed with an indwelling Foley balloon catheter in place and large amount of gas within the lumen, presumably iatrogenic. Stomach/Bowel: Nasogastric tube extends into the stomach. Stomach is otherwise normal in appearance. No pathologic dilatation of small bowel or colon. The appendix is not confidently identified and may be surgically absent. Regardless, there are no inflammatory changes noted adjacent to the cecum to suggest the presence of an acute appendicitis at this time. Lymphatic: No lymphadenopathy noted in the  abdomen or pelvis. Reproductive: Status post hysterectomy. Ovaries are not confidently identified may be surgically absent. Other: No significant volume of ascites.  No pneumoperitoneum. Musculoskeletal: There are no aggressive appearing lytic or blastic lesions noted in the visualized portions of the skeleton. Review of the MIP images confirms the above findings. IMPRESSION: 1. Findings in the lungs suggest sequela of massive aspiration with widespread aspiration pneumonia/pneumonitis and substantial areas of atelectasis, most evident in the right lower lobe which is completely non aerated at this time. 2. Minimally displaced anterolateral right third rib fracture and nondisplaced anterolateral right fourth rib fracture. No associated pneumothorax. 3. No acute abnormality of the thoracoabdominal aorta. 4. No acute findings noted in  the abdomen or pelvis. 5. Support apparatus, as above. Endotracheal tube is slightly low lying, within 1 cm of the carina. Repositioning of the endotracheal tube is advised. Electronically Signed   By: Vinnie Langton M.D.   On: 06/25/2021 13:46    Microbiology Recent Results (from the past 240 hour(s))  Expectorated Sputum Assessment w Gram Stain, Rflx to Resp Cult     Status: None   Collection Time: 2021/07/14  6:01 PM   Specimen: Endotracheal; Sputum  Result Value Ref Range Status   Specimen Description ENDOTRACHEAL  Final   Special Requests NONE  Final   Sputum evaluation   Final    THIS SPECIMEN IS ACCEPTABLE FOR SPUTUM CULTURE Performed at [facility], [place] [address] [place], [place] [address]    Report Status 06/23/2021 FINAL  Final  Culture, Respiratory w Gram Stain     Status: None   Collection Time: Jul 14, 2021  6:01 PM   Specimen: Endotracheal  Result Value Ref Range Status   Specimen Description ENDOTRACHEAL  Final   Special Requests NONE Reflexed from [id number]  Final   Gram Stain   Final    NO SQUAMOUS EPITHELIAL CELLS SEEN FEW WBC SEEN NO ORGANISMS  SEEN Performed at [facility], [place] [address], [place], [place] [address]    Culture FEW STAPHYLOCOCCUS AUREUS  Final   Report Status 06/25/2021 FINAL  Final   Organism ID, Bacteria STAPHYLOCOCCUS AUREUS  Final      Susceptibility   Staphylococcus aureus - MIC*    CIPROFLOXACIN <=0.5 SENSITIVE Sensitive     ERYTHROMYCIN <=0.25 SENSITIVE Sensitive     GENTAMICIN <=0.5 SENSITIVE Sensitive     OXACILLIN <=0.25 SENSITIVE Sensitive     TETRACYCLINE <=1 SENSITIVE Sensitive     VANCOMYCIN 1 SENSITIVE Sensitive     TRIMETH/SULFA <=10 SENSITIVE Sensitive     CLINDAMYCIN <=0.25 SENSITIVE Sensitive     RIFAMPIN <=0.5 SENSITIVE Sensitive     Inducible Clindamycin NEGATIVE Sensitive     * FEW STAPHYLOCOCCUS AUREUS  Culture, BAL-quantitative w Gram Stain     Status: Abnormal   Collection Time: 07-14-2021  7:03 PM   Specimen: Bronchoalveolar Lavage; Respiratory  Result Value Ref Range Status   Specimen Description BRONCHIAL ALVEOLAR LAVAGE  Final   Special Requests Normal  Final   Gram Stain   Final    NO SQUAMOUS EPITHELIAL CELLS SEEN RARE WBC SEEN NO ORGANISMS SEEN Performed at [facility], [address], [place], [place] [address]    Culture >=100,000 COLONIES/mL STAPHYLOCOCCUS AUREUS (A)  Final   Report Status 06/26/2021 FINAL  Final   Organism ID, Bacteria STAPHYLOCOCCUS AUREUS (A)  Final      Susceptibility   Staphylococcus aureus - MIC*    CIPROFLOXACIN <=0.5 SENSITIVE Sensitive     ERYTHROMYCIN <=0.25 SENSITIVE Sensitive     GENTAMICIN <=0.5 SENSITIVE Sensitive     OXACILLIN <=0.25 SENSITIVE Sensitive     TETRACYCLINE <=1 SENSITIVE Sensitive     VANCOMYCIN 1 SENSITIVE Sensitive     TRIMETH/SULFA <=10 SENSITIVE Sensitive     CLINDAMYCIN <=0.25 SENSITIVE Sensitive     RIFAMPIN <=0.5 SENSITIVE Sensitive     Inducible Clindamycin NEGATIVE Sensitive     * >=100,000 COLONIES/mL STAPHYLOCOCCUS AUREUS  Culture, blood (routine x 2)     Status: None   Collection Time:  06/23/21  2:37 PM   Specimen: BLOOD RIGHT HAND  Result Value Ref Range Status   Specimen Description BLOOD RIGHT HAND  Final   Special Requests   Final    BOTTLES DRAWN AEROBIC AND ANAEROBIC Blood Culture adequate volume   Culture   Final    NO GROWTH 5 DAYS Performed at [facility], [address], [place], [place] [address]    Report Status 06/28/2021 FINAL  Final  Culture, blood (routine x 2)     Status: None   Collection Time: 06/23/21  2:46 PM   Specimen: BLOOD LEFT HAND  Result Value Ref Range Status   Specimen Description BLOOD LEFT HAND  Final   Special Requests   Final    BOTTLES DRAWN AEROBIC AND ANAEROBIC Blood Culture adequate volume   Culture   Final    NO GROWTH 5 DAYS Performed at [facility], [place] [address], [place], [place] [address]    Report Status 06/28/2021 FINAL  Final  Urine Culture     Status: None   Collection Time: 06/23/21  4:18 PM   Specimen: Urine, Catheterized  Result Value Ref Range Status   Specimen Description URINE, CATHETERIZED  Final   Special Requests Normal  Final   Culture   Final    NO GROWTH Performed at [place] [address] [place],  [address]    Report Status 07/02/2021 FINAL  Final    Lab Basic Metabolic Panel: Recent Labs  Lab 06/23/21 2043 06/10/2021 0018 07/05/2021 0019 06/23/2021 0406 07/02/2021 0850  NA 164* 165* 162* 165* 159*  K 3.1* 3.4* 3.5 3.1* 3.0*  CL  --   --  >130*  --  >130*  CO2  --   --  23  --  23  GLUCOSE  --   --  147*  --  148*  BUN  --   --  11  --  14  CREATININE  --   --  0.72  --  0.69  CALCIUM  --   --  8.5*  --  8.2*   Liver Function Tests: Recent Labs  Lab 07/01/2021 0019 06/30/2021 0850  AST 12* 10*  ALT 15 14  ALKPHOS 79 76  BILITOT 0.4 0.2*  PROT 5.3* 5.3*  ALBUMIN 2.4* 2.3*   No results for input(s): LIPASE, AMYLASE in the last 168 hours. No results for input(s): AMMONIA in the last 168 hours. CBC: Recent Labs  Lab 06/23/21 1730 06/23/21 2043  06/27/2021 0018 06/26/2021 0019 06/28/2021 0406 07/02/2021 0850  WBC 11.0*  --   --  12.7*  --  15.2*  HGB 9.3* 8.8* 8.8* 9.6* 8.8* 9.9*  HCT 29.5* 26.0* 26.0* 29.5* 26.0* 30.6*  MCV 96.1  --   --  95.5  --  95.9  PLT 168  --   --  166  --  154   Cardiac Enzymes: No results for input(s): CKTOTAL, CKMB, CKMBINDEX, TROPONINI in the last 168 hours. Sepsis Labs: Recent Labs  Lab 06/23/21 1730 06/18/2021 0019 06/29/2021 0850  WBC 11.0* 12.7* 15.2*    Procedures/Operations  Mechanical ventilation, cerebral flow study, organ procurement.    Kenneth Cuaresma 06/30/2021, 3:34 PM

## 2021-07-08 DEATH — deceased

## 2022-04-15 LAB — OPIATE, QUANTITATIVE, URINE
OXYCODONE+OXYMORPHONE UR QL SCN: NEGATIVE
Opiates: NEGATIVE
